# Patient Record
Sex: Male | Born: 1945 | Race: White | Hispanic: No | Marital: Married | Smoking: Never smoker
Health system: Southern US, Community
[De-identification: ages and names within clinical notes are randomized; demographics above are authoritative.]

## PROBLEM LIST (undated history)

## (undated) DIAGNOSIS — I10 Essential (primary) hypertension: Secondary | ICD-10-CM

## (undated) DIAGNOSIS — R58 Hemorrhage, not elsewhere classified: Secondary | ICD-10-CM

## (undated) DIAGNOSIS — I82409 Acute embolism and thrombosis of unspecified deep veins of unspecified lower extremity: Secondary | ICD-10-CM

## (undated) DIAGNOSIS — I2699 Other pulmonary embolism without acute cor pulmonale: Secondary | ICD-10-CM

## (undated) DIAGNOSIS — I499 Cardiac arrhythmia, unspecified: Secondary | ICD-10-CM

## (undated) HISTORY — DX: Acute embolism and thrombosis of unspecified deep veins of unspecified lower extremity: I82.409

## (undated) HISTORY — DX: Hemorrhage, not elsewhere classified: R58

## (undated) HISTORY — PX: KNEE SURGERY: SHX244

## (undated) HISTORY — DX: Essential (primary) hypertension: I10

## (undated) HISTORY — DX: Other pulmonary embolism without acute cor pulmonale: I26.99

---

## 2015-01-23 ENCOUNTER — Ambulatory Visit (INDEPENDENT_AMBULATORY_CARE_PROVIDER_SITE_OTHER): Payer: 59 | Admitting: Family Medicine

## 2015-01-23 VITALS — BP 112/64 | HR 76 | Temp 98.6°F | Resp 18 | Ht 70.25 in | Wt 167.0 lb

## 2015-01-23 DIAGNOSIS — R197 Diarrhea, unspecified: Secondary | ICD-10-CM

## 2015-01-23 DIAGNOSIS — H6123 Impacted cerumen, bilateral: Secondary | ICD-10-CM

## 2015-01-23 LAB — POCT CBC
Granulocyte percent: 79.3 %G (ref 37–80)
HCT, POC: 42.6 % — AB (ref 43.5–53.7)
Hemoglobin: 14.2 g/dL (ref 14.1–18.1)
Lymph, poc: 1.1 (ref 0.6–3.4)
MCH, POC: 29.1 pg (ref 27–31.2)
MCHC: 33.3 g/dL (ref 31.8–35.4)
MCV: 87.5 fL (ref 80–97)
MID (cbc): 0.4 (ref 0–0.9)
MPV: 7.2 fL (ref 0–99.8)
POC Granulocyte: 5.6 (ref 2–6.9)
POC LYMPH PERCENT: 14.8 %L (ref 10–50)
POC MID %: 5.9 %M (ref 0–12)
Platelet Count, POC: 173 10*3/uL (ref 142–424)
RBC: 4.86 M/uL (ref 4.69–6.13)
RDW, POC: 12.9 %
WBC: 7.1 10*3/uL (ref 4.6–10.2)

## 2015-01-23 MED ORDER — SULFAMETHOXAZOLE-TRIMETHOPRIM 400-80 MG PO TABS: 1.0000 | ORAL_TABLET | Freq: Two times a day (BID) | ORAL | Status: DC

## 2015-01-23 NOTE — Patient Instructions (Signed)
Probiotic 3 times a day (align or culturelle or similar product)  For 3 days.  Take the septra if symptoms continue 2 more days or more.   Diarrhea Diarrhea is frequent loose and watery bowel movements. It can cause you to feel weak and dehydrated. Dehydration can cause you to become tired and thirsty, have a dry mouth, and have decreased urination that often is dark yellow. Diarrhea is a sign of another problem, most often an infection that will not last long. In most cases, diarrhea typically lasts 2-3 days. However, it can last longer if it is a sign of something more serious. It is important to treat your diarrhea as directed by your caregiver to lessen or prevent future episodes of diarrhea. CAUSES  Some common causes include:  Gastrointestinal infections caused by viruses, bacteria, or parasites.  Food poisoning or food allergies.  Certain medicines, such as antibiotics, chemotherapy, and laxatives.  Artificial sweeteners and fructose.  Digestive disorders. HOME CARE INSTRUCTIONS  Ensure adequate fluid intake (hydration): Have 1 cup (8 oz) of fluid for each diarrhea episode. Avoid fluids that contain simple sugars or sports drinks, fruit juices, whole milk products, and sodas. Your urine should be clear or pale yellow if you are drinking enough fluids. Hydrate with an oral rehydration solution that you can purchase at pharmacies, retail stores, and online. You can prepare an oral rehydration solution at home by mixing the following ingredients together:   - tsp table salt.   tsp baking soda.   tsp salt substitute containing potassium chloride.  1  tablespoons sugar.  1 L (34 oz) of water.  Certain foods and beverages may increase the speed at which food moves through the gastrointestinal (GI) tract. These foods and beverages should be avoided and include:  Caffeinated and alcoholic beverages.  High-fiber foods, such as raw fruits and vegetables, nuts, seeds, and whole grain  breads and cereals.  Foods and beverages sweetened with sugar alcohols, such as xylitol, sorbitol, and mannitol.  Some foods may be well tolerated and may help thicken stool including:  Starchy foods, such as rice, toast, pasta, low-sugar cereal, oatmeal, grits, baked potatoes, crackers, and bagels.  Bananas.  Applesauce.  Add probiotic-rich foods to help increase healthy bacteria in the GI tract, such as yogurt and fermented milk products.  Wash your hands well after each diarrhea episode.  Only take over-the-counter or prescription medicines as directed by your caregiver.  Take a warm bath to relieve any burning or pain from frequent diarrhea episodes. SEEK IMMEDIATE MEDICAL CARE IF:   You are unable to keep fluids down.  You have persistent vomiting.  You have blood in your stool, or your stools are black and tarry.  You do not urinate in 6-8 hours, or there is only a small amount of very dark urine.  You have abdominal pain that increases or localizes.  You have weakness, dizziness, confusion, or light-headedness.  You have a severe headache.  Your diarrhea gets worse or does not get better.  You have a fever or persistent symptoms for more than 2-3 days.  You have a fever and your symptoms suddenly get worse. MAKE SURE YOU:   Understand these instructions.  Will watch your condition.  Will get help right away if you are not doing well or get worse. Document Released: 11/02/2002 Document Revised: 03/29/2014 Document Reviewed: 07/20/2012 [facility] Patient Information 2015 [employer] [place]. This information is not intended to replace advice given to you by your health care provider. Make  sure you discuss any questions you have with your health care provider.  

## 2015-01-23 NOTE — Progress Notes (Signed)
° °  Subjective:    Patient ID: Dwayne [name], male    DOB: 04/17/1946, 69 y.o.   MRN: [id number]  HPI Chief Complaint  Patient presents with   Chills   Fever   Diarrhea   Ear Pain    both ears   This chart was scribed for Elvina SidleKurt Lauenstein, MD by Andrew Auaven Small, ED Scribe. This patient was seen in room 4 and the patient's care was started at 11:29 AM.  HPI Comments: Dwayne [name] is a 69 y.o. male who presents to the [facility] complaining of diarrhea that began 1 day ago. Pt states yesterday he began to have chills, diarrhea, mild otalgia and felt feverish. Pt denies having BM today. Pt [place] going [place] to [place] and would like symptoms to be resolved. Pt denies recent travels or consuming contaminated water. He denies emesis, abdominal pain. Pt works as a Systems analystsoftware developer.  Pt is allergic to penicillin.   Past Medical History  Diagnosis Date   Hypertension    History reviewed. No pertinent past surgical history. Prior to Admission medications   Medication Sig Start Date End Date Taking? Authorizing Provider  lisinopril-hydrochlorothiazide (PRINZIDE,ZESTORETIC) 10-12.5 MG per tablet Take 1 tablet by mouth daily.   Yes Historical Provider, MD   Review of Systems  Constitutional: Positive for fever and chills.  HENT: Positive for ear pain.   Gastrointestinal: Positive for diarrhea. Negative for vomiting and abdominal pain.       Objective:   Physical Exam  Constitutional: He is oriented to person, place, and time. He appears well-developed and well-nourished. No distress.  HENT:  Head: Normocephalic and atraumatic.  Cerumen impaction in bilateral ears  Eyes: Conjunctivae and EOM are normal.  Neck: Neck supple.  Cardiovascular: Normal rate.   Pulmonary/Chest: Effort normal.  Abdominal:  Hyperactive bowel sounds.   Musculoskeletal: Normal range of motion.  Neurological: He is alert and oriented to person, place, and time.  Skin:  Skin is warm and dry.  Psychiatric: He has a normal mood and affect. His behavior is normal.  Nursing note and vitals reviewed.  Results for orders placed or performed in visit on 01/23/15  POCT CBC  Result Value Ref Range   WBC 7.1 4.6 - 10.2 K/uL   Lymph, poc 1.1 0.6 - 3.4   POC LYMPH PERCENT 14.8 10 - 50 %L   MID (cbc) 0.4 0 - 0.9   POC MID % 5.9 0 - 12 %M   POC Granulocyte 5.6 2 - 6.9   Granulocyte percent 79.3 37 - 80 %G   RBC 4.86 4.69 - 6.13 M/uL   Hemoglobin 14.2 14.1 - 18.1 g/dL   HCT, POC 11.942.6 (A) 14.743.5 - 53.7 %   MCV 87.5 80 - 97 fL   MCH, POC 29.1 27 - 31.2 pg   MCHC 33.3 31.8 - 35.4 g/dL   RDW, POC 82.912.9 %   Platelet Count, POC 173 142 - 424 K/uL   MPV 7.2 0 - 99.8 fL       Assessment & Plan:   This chart was scribed in my presence and reviewed by me personally.    ICD-9-CM ICD-10-CM   1. Diarrhea 787.91 R19.7 POCT CBC     sulfamethoxazole-trimethoprim (BACTRIM) 400-80 MG per tablet  2. Cerumen impaction, bilateral 380.4 H61.23 POCT CBC     Signed, Elvina SidleKurt Lauenstein, MD

## 2015-01-23 NOTE — Progress Notes (Deleted)
      Chief Complaint: No chief complaint on file.   HPI: Dwayne [name] is a 69 y.o. male who is here for  ***  No past medical history on file. No past surgical history on file. History   Social History  . Marital Status: Married    Spouse Name: N/A  . Number of Children: N/A  . Years of Education: N/A   Social History Main Topics  . Smoking status: Not on file  . Smokeless tobacco: Not on file  . Alcohol Use: Not on file  . Drug Use: Not on file  . Sexual Activity: Not on file   Other Topics Concern  . Not on file   Social History Narrative  . No narrative on file   No family history on file. Allergies not on file Prior to Admission medications   Not on File     ROS: The patient denies fevers, chills, night sweats, unintentional weight loss, chest pain, palpitations, wheezing, dyspnea on exertion, nausea, vomiting, abdominal pain, dysuria, hematuria, melena, numbness, weakness, or tingling. ***  All other systems have been reviewed and were otherwise negative with the exception of those mentioned in the HPI and as above.    PHYSICAL EXAM: There were no vitals filed for this visit. There were no vitals filed for this visit. There is no height or weight on file to calculate BMI.  General: Alert, no acute distress HEENT:  Normocephalic, atraumatic, oropharynx patent. EOMI, PERRLA Cardiovascular:  Regular rate and rhythm, no rubs murmurs or gallops.  No Carotid bruits, radial pulse intact. No pedal edema.  Respiratory: Clear to auscultation bilaterally.  No wheezes, rales, or rhonchi.  No cyanosis, no use of accessory musculature GI: No organomegaly, abdomen is soft and non-tender, positive bowel sounds.  No masses. Skin: No rashes. Neurologic: Facial musculature symmetric. Psychiatric: Patient is appropriate throughout our interaction. Lymphatic: No cervical lymphadenopathy Musculoskeletal: Gait intact.   LABS: No results found for this or any previous  visit.   EKG/XRAY:   [facility]   ASSESSMENT/PLAN: No diagnosis found.   Gross sideeffects, risk and benefits, and alternatives of medications d/w patient. Patient is aware that all medications have potential sideeffects and we are unable to predict every sideeffect or drug-drug interaction that may occur.  LE, THAO PHUONG, DO 01/23/2015 10:58 AM

## 2015-07-27 ENCOUNTER — Ambulatory Visit (HOSPITAL_COMMUNITY)
Admission: RE | Admit: 2015-07-27 | Discharge: 2015-07-27 | Disposition: A | Discharge status: Home / Self Care | Payer: 59 | Source: Ambulatory Visit | Attending: Ophthalmology | Admitting: Ophthalmology

## 2015-07-27 ENCOUNTER — Encounter: Payer: Self-pay | Admitting: Cardiovascular Disease

## 2015-07-27 ENCOUNTER — Ambulatory Visit (INDEPENDENT_AMBULATORY_CARE_PROVIDER_SITE_OTHER): Payer: 59 | Admitting: Cardiovascular Disease

## 2015-07-27 ENCOUNTER — Other Ambulatory Visit: Payer: Self-pay | Admitting: Ophthalmology

## 2015-07-27 VITALS — BP 181/87 | HR 82 | Ht 71.0 in | Wt 179.0 lb

## 2015-07-27 DIAGNOSIS — M7989 Other specified soft tissue disorders: Secondary | ICD-10-CM | POA: Insufficient documentation

## 2015-07-27 DIAGNOSIS — I82402 Acute embolism and thrombosis of unspecified deep veins of left lower extremity: Secondary | ICD-10-CM | POA: Diagnosis not present

## 2015-07-27 DIAGNOSIS — I82409 Acute embolism and thrombosis of unspecified deep veins of unspecified lower extremity: Secondary | ICD-10-CM | POA: Diagnosis not present

## 2015-07-27 DIAGNOSIS — I82432 Acute embolism and thrombosis of left popliteal vein: Secondary | ICD-10-CM | POA: Diagnosis not present

## 2015-07-27 DIAGNOSIS — Z79899 Other long term (current) drug therapy: Secondary | ICD-10-CM

## 2015-07-27 MED ORDER — RIVAROXABAN 20 MG PO TABS: 20.0000 mg | ORAL_TABLET | Freq: Every day | ORAL | Status: DC

## 2015-07-27 NOTE — Progress Notes (Signed)
     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 He is referred by orthopedics for evaluation of swelling in his left knee and calf. He does have a history of hypertension and is treated but no other cardiovascular risk factors. He had arthroscopic left knee surgery by Dr. Thomasena Edis on August 4. Several days ago he noticed swelling and discomfort in his left knee and calf. Venous Dopplers today confirmed a left popliteal tibial and peroneal vein DVT.   Current Outpatient Prescriptions  Medication Sig Dispense Refill  . lisinopril-hydrochlorothiazide (PRINZIDE,ZESTORETIC) 10-12.5 MG per tablet Take 1 tablet by mouth daily.    . Multiple Vitamin (MULTIVITAMIN) capsule Take 1 capsule by mouth daily.    . rivaroxaban (XARELTO) 20 MG TABS tablet Take 1 tablet (20 mg total) by mouth daily with supper. 30 tablet 2   No current facility-administered medications for this visit.    Allergies  Allergen Reactions  . Penicillins     Social History   Social History  . Marital Status: Married    Spouse Name: N/A  . Number of Children: N/A  . Years of Education: N/A   Occupational History  . Not on file.   Social History Main Topics  . Smoking status: Never Smoker   . Smokeless tobacco: Not on file  . Alcohol Use: 0.0 oz/week    0 Standard drinks or equivalent per week     Comment: occ  . Drug Use: Not on file  . Sexual Activity: Not on file   Other Topics Concern  . Not on file   Social History Narrative     Review of Systems: General: negative for chills, fever, night sweats or weight changes.  Cardiovascular: negative for chest pain, dyspnea on exertion, edema, orthopnea, palpitations, paroxysmal nocturnal dyspnea or  shortness of breath Dermatological: negative for rash Respiratory: negative for cough or wheezing Urologic: negative for hematuria Abdominal: negative for nausea, vomiting, diarrhea, bright red blood per rectum, melena, or hematemesis Neurologic: negative for visual changes, syncope, or dizziness All other systems reviewed and are otherwise negative except as noted above.    Blood pressure 181/87, pulse 82, height  (1.803 m), weight 179 lb (81.194 kg).  General appearance: alert and no distress Neck: no adenopathy, no carotid bruit, no JVD, supple, symmetrical, trachea midline and thyroid not enlarged, symmetric, no tenderness/mass/nodules Lungs: clear to auscultation bilaterally Heart: regular rate and rhythm, S1, S2 normal, no murmur, click, rub or gallop Extremities: asymmetric swelling left calf with some  tenderness to palpation  EKG not performed today  ASSESSMENT AND PLAN:   DVT of lower limb, acute Dwayne [name] is being seated as an add-on today for a left popliteal and tibial vein DVT now 3 weeks post left knee arthroscopic surgery performed by Dr. Thomasena Edis. He relates he has been fairly inactive. Last several days she's had swelling stiffness and pain in his left knee and calf. Venous Dopplers today confirmed left popliteal and peroneal/tibial vein DVT. I'm going to begin him on Xarelto oral  coagulation which was open continue for 3 months and obtain a follow-up venous Doppler study at that time      Runell Gess MD [facility], [facility] 07/27/2015 5:27 PM

## 2015-07-27 NOTE — Patient Instructions (Signed)
  We will see you back in follow up in 3 months with Dr Allyson Sabal after the doppler.   Dr Allyson Sabal has ordered: 1. Start Xarelto  one tablet daily with supper (or as directed)  2. lower venous duplex (to be done in 3 months). This test is an ultrasound of the veins in the legs. It looks at venous blood flow that carries blood from the heart to the legs or arms. Allow one hour for a Lower Venous exam. There are no restrictions or special instructions.

## 2015-07-27 NOTE — Assessment & Plan Note (Signed)
[name] is being seated as an add-on today for a left popliteal and tibial vein DVT now 3 weeks post left knee arthroscopic surgery performed by Dr. Thomasena Edis. He relates he has been fairly inactive. Last several days she's had swelling stiffness and pain in his left knee and calf. Venous Dopplers today confirmed left popliteal and peroneal/tibial vein DVT. I'm going to begin him on Xarelto oral  coagulation which was open continue for 3 months and obtain a follow-up venous Doppler study at that time

## 2015-07-28 ENCOUNTER — Other Ambulatory Visit: Payer: Self-pay | Admitting: Specialist

## 2015-07-28 ENCOUNTER — Other Ambulatory Visit (HOSPITAL_COMMUNITY): Payer: Self-pay | Admitting: Specialist

## 2015-07-28 DIAGNOSIS — Z79899 Other long term (current) drug therapy: Secondary | ICD-10-CM

## 2015-07-28 DIAGNOSIS — I82402 Acute embolism and thrombosis of unspecified deep veins of left lower extremity: Secondary | ICD-10-CM

## 2015-07-28 LAB — BASIC METABOLIC PANEL
BUN: 13 mg/dL (ref 7–25)
CALCIUM: 9.1 mg/dL (ref 8.6–10.3)
CHLORIDE: 96 mmol/L — AB (ref 98–110)
CO2: 29 mmol/L (ref 20–31)
CREATININE: 0.87 mg/dL (ref 0.70–1.25)
Glucose, Bld: 90 mg/dL (ref 65–99)
Potassium: 4.2 mmol/L (ref 3.5–5.3)
Sodium: 134 mmol/L — ABNORMAL LOW (ref 135–146)

## 2015-08-02 ENCOUNTER — Encounter: Payer: Self-pay | Admitting: *Deleted

## 2015-10-12 ENCOUNTER — Other Ambulatory Visit: Payer: Self-pay | Admitting: Cardiovascular Disease

## 2015-10-12 DIAGNOSIS — I824Y9 Acute embolism and thrombosis of unspecified deep veins of unspecified proximal lower extremity: Secondary | ICD-10-CM

## 2015-10-18 ENCOUNTER — Ambulatory Visit (HOSPITAL_COMMUNITY)
Admission: RE | Admit: 2015-10-18 | Discharge: 2015-10-18 | Disposition: A | Discharge status: Home / Self Care | Payer: 59 | Source: Ambulatory Visit | Attending: Cardiovascular Disease | Admitting: Cardiovascular Disease

## 2015-10-18 DIAGNOSIS — I82592 Chronic embolism and thrombosis of other specified deep vein of left lower extremity: Secondary | ICD-10-CM | POA: Diagnosis not present

## 2015-10-18 DIAGNOSIS — I82542 Chronic embolism and thrombosis of left tibial vein: Secondary | ICD-10-CM | POA: Insufficient documentation

## 2015-10-18 DIAGNOSIS — I82532 Chronic embolism and thrombosis of left popliteal vein: Secondary | ICD-10-CM | POA: Diagnosis not present

## 2015-10-18 DIAGNOSIS — I824Y9 Acute embolism and thrombosis of unspecified deep veins of unspecified proximal lower extremity: Secondary | ICD-10-CM

## 2015-10-18 DIAGNOSIS — I10 Essential (primary) hypertension: Secondary | ICD-10-CM | POA: Diagnosis not present

## 2015-10-25 ENCOUNTER — Ambulatory Visit (INDEPENDENT_AMBULATORY_CARE_PROVIDER_SITE_OTHER): Payer: 59 | Admitting: Cardiovascular Disease

## 2015-10-25 ENCOUNTER — Encounter: Payer: Self-pay | Admitting: Cardiovascular Disease

## 2015-10-25 VITALS — BP 124/72 | HR 77 | Ht 70.0 in | Wt 188.0 lb

## 2015-10-25 DIAGNOSIS — I82402 Acute embolism and thrombosis of unspecified deep veins of left lower extremity: Secondary | ICD-10-CM | POA: Diagnosis not present

## 2015-10-25 NOTE — Patient Instructions (Signed)
Follow up with Dr Berry as needed.  

## 2015-10-25 NOTE — Assessment & Plan Note (Signed)
Dwayne [name]  return safely follow up of his left popliteal and tibial vein DVT. I saw him in him in the office 07/27/15. He was postop surgery on 06/30/15 and had swelling in his knee and calf. He was begun on Zaroxolyn which she has continued since that time. Symptoms have markedly improved. 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 At this point, I feel comfortable stopping his oral anticoagulations and seeing whether or not he has recurrent symptoms. If so he will need to be on prolonged chronic regulation and if not I will see him back when necessary 

## 2015-10-25 NOTE — Progress Notes (Signed)
Dwayne [name]  return safely follow up of his left popliteal and tibial vein DVT. I saw him in him in the office 07/27/15. He was postop surgery on 06/30/15 and had swelling in his knee and calf. He was begun on Zaroxolyn which she has continued since that time. Symptoms have markedly improved. 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 At this point, I feel comfortable stopping his oral anticoagulations and seeing whether or not he has recurrent symptoms. If so he will need to be on prolonged chronic regulation and if not I will see him back when necessary

## 2016-03-03 ENCOUNTER — Other Ambulatory Visit: Payer: Self-pay

## 2016-03-03 ENCOUNTER — Emergency Department (HOSPITAL_BASED_OUTPATIENT_CLINIC_OR_DEPARTMENT_OTHER): Payer: 59

## 2016-03-03 ENCOUNTER — Emergency Department (HOSPITAL_BASED_OUTPATIENT_CLINIC_OR_DEPARTMENT_OTHER)
Admission: EM | Admit: 2016-03-03 | Discharge: 2016-03-03 | Disposition: A | Discharge status: Home / Self Care | Payer: 59 | Attending: Emergency Medicine | Admitting: Emergency Medicine

## 2016-03-03 ENCOUNTER — Encounter (HOSPITAL_BASED_OUTPATIENT_CLINIC_OR_DEPARTMENT_OTHER): Payer: Self-pay | Admitting: *Deleted

## 2016-03-03 ENCOUNTER — Ambulatory Visit (INDEPENDENT_AMBULATORY_CARE_PROVIDER_SITE_OTHER): Payer: 59 | Admitting: Family Medicine

## 2016-03-03 ENCOUNTER — Ambulatory Visit (HOSPITAL_BASED_OUTPATIENT_CLINIC_OR_DEPARTMENT_OTHER)
Admission: RE | Admit: 2016-03-03 | Discharge: 2016-03-03 | Disposition: A | Discharge status: Home / Self Care | Payer: 59 | Source: Ambulatory Visit | Attending: Family Medicine | Admitting: Family Medicine

## 2016-03-03 VITALS — BP 150/70 | HR 92 | Temp 99.7°F | Resp 16 | Ht 70.0 in | Wt 181.8 lb

## 2016-03-03 DIAGNOSIS — M79652 Pain in left thigh: Secondary | ICD-10-CM | POA: Insufficient documentation

## 2016-03-03 DIAGNOSIS — I10 Essential (primary) hypertension: Secondary | ICD-10-CM | POA: Diagnosis not present

## 2016-03-03 DIAGNOSIS — I82412 Acute embolism and thrombosis of left femoral vein: Secondary | ICD-10-CM | POA: Diagnosis not present

## 2016-03-03 DIAGNOSIS — R05 Cough: Secondary | ICD-10-CM

## 2016-03-03 DIAGNOSIS — R0609 Other forms of dyspnea: Secondary | ICD-10-CM

## 2016-03-03 DIAGNOSIS — Z79899 Other long term (current) drug therapy: Secondary | ICD-10-CM | POA: Diagnosis not present

## 2016-03-03 DIAGNOSIS — I2609 Other pulmonary embolism with acute cor pulmonale: Secondary | ICD-10-CM | POA: Insufficient documentation

## 2016-03-03 DIAGNOSIS — Z880 Allergy status to penicillin: Secondary | ICD-10-CM | POA: Insufficient documentation

## 2016-03-03 DIAGNOSIS — Z86718 Personal history of other venous thrombosis and embolism: Secondary | ICD-10-CM | POA: Diagnosis not present

## 2016-03-03 DIAGNOSIS — Z7982 Long term (current) use of aspirin: Secondary | ICD-10-CM | POA: Insufficient documentation

## 2016-03-03 DIAGNOSIS — R0602 Shortness of breath: Secondary | ICD-10-CM | POA: Diagnosis present

## 2016-03-03 DIAGNOSIS — I82432 Acute embolism and thrombosis of left popliteal vein: Secondary | ICD-10-CM | POA: Insufficient documentation

## 2016-03-03 DIAGNOSIS — R059 Cough, unspecified: Secondary | ICD-10-CM

## 2016-03-03 DIAGNOSIS — R0600 Dyspnea, unspecified: Secondary | ICD-10-CM

## 2016-03-03 LAB — BASIC METABOLIC PANEL
Anion gap: 9 (ref 5–15)
BUN: 17 mg/dL (ref 6–20)
CHLORIDE: 94 mmol/L — AB (ref 101–111)
CO2: 29 mmol/L (ref 22–32)
Calcium: 9.2 mg/dL (ref 8.9–10.3)
Creatinine, Ser: 0.99 mg/dL (ref 0.61–1.24)
GFR calc Af Amer: >60 mL/min (ref >60)
GLUCOSE: 119 mg/dL — AB (ref 65–99)
POTASSIUM: 3.7 mmol/L (ref 3.5–5.1)
Sodium: 132 mmol/L — ABNORMAL LOW (ref 135–145)

## 2016-03-03 LAB — TROPONIN I: Troponin I: <0.03 ng/mL (ref <0.031)

## 2016-03-03 LAB — CBC WITH DIFFERENTIAL/PLATELET
Basophils Absolute: 0 10*3/uL (ref 0.0–0.1)
Basophils Relative: 0 %
Eosinophils Absolute: 0.2 10*3/uL (ref 0.0–0.7)
Eosinophils Relative: 1 %
HCT: 43.6 % (ref 39.0–52.0)
Hemoglobin: 15.3 g/dL (ref 13.0–17.0)
LYMPHS ABS: 1.5 10*3/uL (ref 0.7–4.0)
LYMPHS PCT: 11 %
MCH: 29.4 pg (ref 26.0–34.0)
MCHC: 35.1 g/dL (ref 30.0–36.0)
MCV: 83.8 fL (ref 78.0–100.0)
MONOS PCT: 12 %
Monocytes Absolute: 1.6 10*3/uL — ABNORMAL HIGH (ref 0.1–1.0)
NEUTROS ABS: 9.9 10*3/uL — AB (ref 1.7–7.7)
NEUTROS PCT: 76 %
PLATELETS: 191 10*3/uL (ref 150–400)
RBC: 5.2 MIL/uL (ref 4.22–5.81)
RDW: 12.6 % (ref 11.5–15.5)
WBC: 13.3 10*3/uL — AB (ref 4.0–10.5)

## 2016-03-03 LAB — BRAIN NATRIURETIC PEPTIDE: B Natriuretic Peptide: 59.3 pg/mL (ref 0.0–100.0)

## 2016-03-03 MED ORDER — SODIUM CHLORIDE 0.9 % IV BOLUS (SEPSIS)
1000.0000 mL | Freq: Once | INTRAVENOUS | Status: AC
  Administered 2016-03-03: 1000 mL via INTRAVENOUS

## 2016-03-03 MED ORDER — RIVAROXABAN (XARELTO) VTE STARTER PACK (15 & 20 MG): 15.0000 mg | ORAL_TABLET | Freq: Two times a day (BID) | ORAL | Status: DC

## 2016-03-03 MED ORDER — IOPAMIDOL (ISOVUE-370) INJECTION 76%
100.0000 mL | Freq: Once | INTRAVENOUS | Status: AC | PRN
  Administered 2016-03-03: 100 mL via INTRAVENOUS

## 2016-03-03 MED ORDER — RIVAROXABAN 15 MG PO TABS
15.0000 mg | ORAL_TABLET | Freq: Two times a day (BID) | ORAL | Status: DC
  Administered 2016-03-03: 15 mg via ORAL
  Filled 2016-03-03: qty 1

## 2016-03-03 NOTE — Addendum Note (Signed)
Addended by: Isaac BlissGALLOWAY, Gevin Perea J on: 03/03/2016 03:17 PM   Modules accepted: Orders

## 2016-03-03 NOTE — Addendum Note (Signed)
Addended by: Isaac BlissGALLOWAY, Mescal Flinchbaugh J on: 03/03/2016 03:27 PM   Modules accepted: Orders

## 2016-03-03 NOTE — ED Provider Notes (Signed)
CSN: [id number]     Arrival date & time 03/03/16  1718 History  By signing my name below, I, Dwayne [name], attest that this documentation has been prepared under the direction and in the presence of Dwayne Plan, DO. Electronically Signed: Bethel [name], ED Scribe. 03/03/2016. 7:54 PM   Chief Complaint  Patient presents with  . DVT/Shortness of breath     The history is provided by the patient. No language interpreter was used.   Dwayne [name] is a 70 y.o. male with history of HTN and DVT  who presents to the Emergency Department complaining of constant, 4/10 in severity, left leg pain with onset yesterday. He had an ultrasound this afternoon that showed a blood clot in the LLE. He previously had a blood clot in the same leg that was treated with Xarelto but is not currently on a blood thinner.  Associated symptoms include cough, fever yesterday, and SOB. Pt states that he is a runner and can typically run 9 miles but lately has been unable to go for 1/4 of a mile without issue. He has infrequent episodes of mild chest discomfort, "like gas", that last for seconds. He denies any hard chest pain. Pt also denies any syncopal episodes. No recent travel or surgery.   Past Medical History  Diagnosis Date  . Hypertension   . DVT of lower limb, acute (HCC)     left popliteal tibial and peroneal vein   Past Surgical History  Procedure Laterality Date  . Knee surgery     Family History  Problem Relation Age of Onset  . Stroke Father   . Diabetes Father   . Stroke Maternal Grandmother   . Cancer Paternal Grandfather    Social History  Substance Use Topics  . Smoking status: Never Smoker   . Smokeless tobacco: None  . Alcohol Use: 0.0 oz/week    0 Standard drinks or equivalent per week     Comment: occ    Review of Systems  Constitutional: Positive for fever. Negative for chills.  HENT: Negative for congestion and facial swelling.   Eyes: Negative for discharge and visual  disturbance.  Respiratory: Positive for cough and shortness of breath.   Cardiovascular: Negative for chest pain and palpitations.  Gastrointestinal: Negative for vomiting, abdominal pain and diarrhea.  Musculoskeletal: Positive for myalgias. Negative for arthralgias.       LLE pain  Skin: Negative for color change and rash.  Neurological: Negative for tremors, syncope and headaches.  Psychiatric/Behavioral: Negative for confusion and dysphoric mood.    Allergies  Penicillins  Home Medications   Prior to Admission medications   Medication Sig Start Date End Date Taking? Authorizing Provider  aspirin 81 MG tablet Take 81 mg by mouth every other day.     Historical Provider, MD  lisinopril-hydrochlorothiazide (PRINZIDE,ZESTORETIC) 10-12.5 MG per tablet Take 1 tablet by mouth daily.    Historical Provider, MD  Multiple Vitamin (MULTIVITAMIN) capsule Take 1 capsule by mouth daily.    Historical Provider, MD  Rivaroxaban 15 & 20 MG TBPK Take 15 mg by mouth 2 (two) times daily. Take as directed on package: Start with one  tablet by mouth twice a day with food. On Day 22, switch to one  tablet once a day with food. 03/03/16   Dwayne Plan, DO   BP 159/78 mmHg  Pulse 94  Temp(Src) 102.6 F (39.2 C) (Oral)  Resp 16  Ht  (1.778 m)  Wt 182 lb (82.555 kg)  BMI 26.11 kg/m2  SpO2 98% Physical Exam  Constitutional: He is oriented to person, place, and time. He appears well-developed and well-nourished.  HENT:  Head: Normocephalic and atraumatic.  Eyes: EOM are normal. Pupils are equal, round, and reactive to light.  Neck: Normal range of motion. Neck supple. No JVD present.  Cardiovascular: Normal rate, regular rhythm and intact distal pulses.  Exam reveals no gallop and no friction rub.   No murmur heard. Pulmonary/Chest: No respiratory distress. He has no wheezes.  CTAB  Abdominal: He exhibits no distension. There is no tenderness. There is no rebound and no guarding.   Musculoskeletal: Normal range of motion.  LLE larger than RLE  Neurological: He is alert and oriented to person, place, and time.  Skin: No rash noted. No pallor.  Psychiatric: He has a normal mood and affect. His behavior is normal.  Nursing note and vitals reviewed.   ED Course  Procedures (including critical care time) DIAGNOSTIC STUDIES: Oxygen Saturation is 98% on RA,  normal by my interpretation.    COORDINATION OF CARE: 6:01 PM Discussed treatment [name] which includes lab work and CT angio chest with pt at bedside and pt agreed to [name].  Labs Review Labs Reviewed  CBC WITH DIFFERENTIAL/PLATELET - Abnormal; Notable for the following:    WBC 13.3 (*)    Neutro Abs 9.9 (*)    Monocytes Absolute 1.6 (*)    All other components within normal limits  BASIC METABOLIC PANEL - Abnormal; Notable for the following:    Sodium 132 (*)    Chloride 94 (*)    Glucose, Bld 119 (*)    All other components within normal limits  TROPONIN I  BRAIN NATRIURETIC PEPTIDE    Imaging Review Dg Chest 2 View  03/03/2016  CLINICAL DATA:  Shortness of breath and cough EXAM: CHEST  2 VIEW COMPARISON:  None FINDINGS: Cardiac shadow is within normal limits. The lungs are well aerated bilaterally. No focal infiltrate or sizable effusion is seen. No acute bony abnormality is noted. IMPRESSION: No active cardiopulmonary disease. Electronically Signed   By: Alcide Clever M.D.   On: 03/03/2016 19:22   Ct Angio Chest Pe W/cm &/or Wo Cm  03/03/2016  CLINICAL DATA:  c/o constant left leg pain with onset yesterday. He had an ultrasound this afternoon that showed a blood clot in the LLE. Also c/o cough, fever yesterday, and SOB. Pt states that he is a runner and can typically run 9 miles but lately has been unable to go for 1/4 of a mile without issue. HX DVT, HTN EXAM: CT ANGIOGRAPHY CHEST WITH CONTRAST TECHNIQUE: Multidetector CT imaging of the chest was performed using the standard protocol during bolus  administration of intravenous contrast. Multiplanar CT image reconstructions and MIPs were obtained to evaluate the vascular anatomy. CONTRAST:  75 mL of Isovue 370 intravenous contrast COMPARISON:  Current chest radiograph. FINDINGS: Angiographic study: There multiple bilateral segmental and subsegmental nonocclusive pulmonary emboli. On the right, pulmonary emboli are seen extending from the intralobar pulmonary artery into the right middle lobe and lower lobe segmental branches. Pulmonary emboli are also noted extending into the upper lobe segmental branches. On the left, there is a small embolus in a left upper lobe segmental pulmonary branch. Pulmonary emboli noted to the lower lobe and left upper lobe lingula segmental branches. The pulmonary arteries are normal in caliber. The ascending aorta measures 4.2 cm in diameter. No aortic dissection or significant plaque. Neck base and axilla:  No mass or adenopathy. Mediastinum and hila: Heart normal in size and configuration. Mild to moderate coronary artery calcifications. No mediastinal or hilar masses or pathologically enlarged lymph nodes. Lungs and pleura: Minor subsegmental atelectasis. Lungs otherwise clear. No pleural effusion. No pneumothorax. Limited upper abdomen: Small hiatal hernia. Otherwise unremarkable. Musculoskeletal: Disc degenerative changes along the thoracic spine. No osteoblastic or osteolytic lesions. Review of the MIP images confirms the above findings. IMPRESSION: 1. Multiple bilateral nonocclusive pulmonary emboli as detailed above. No evidence of right heart strain. 2. No other acute findings. Lungs clear other than minor subsegmental atelectasis. Electronically Signed   By: Amie Portland M.D.   On: 03/03/2016 19:29   US Venous Img Lower Unilateral Left  03/03/2016  CLINICAL DATA:  Left thigh pain for 1 day. Shortness of breath with exertion. EXAM: LEFT LOWER EXTREMITY VENOUS DOPPLER ULTRASOUND TECHNIQUE: Gray-scale sonography with  graded compression, as well as color Doppler and duplex ultrasound, were performed to evaluate the deep venous system from the level of the common femoral vein through the popliteal and proximal calf veins. Spectral Doppler was utilized to evaluate flow at rest and with distal augmentation maneuvers. COMPARISON:  None. FINDINGS: Right common femoral vein is patent without thrombus. The left saphenofemoral junction is patent. Proximal left common femoral vein is patent. There is a large amount of heterogeneous occlusive thrombus involving the distal left common femoral vein, left femoral vein and left popliteal vein. Findings compatible with acute DVT. In addition, there is thrombus in the left posterior tibial veins and peroneal veins. The DVT extends down to the left ankle. Left profunda femoral vein is patent. Visualized left great saphenous vein is patent. IMPRESSION: Positive for deep vein thrombosis in left lower extremity. There is a large amount of occlusive clot throughout the left leg. Clot extends from the left ankle up to the distal left common femoral vein. Electronically Signed   By: Richarda Overlie M.D.   On: 03/03/2016 17:07   I have personally reviewed and evaluated these images and lab results as part of my medical decision-making.   EKG Interpretation None       ED ECG REPORT   Date: 03/04/2016  Rate: 92  Rhythm: normal sinus rhythm  QRS Axis: normal  Intervals: normal  ST/T Wave abnormalities: normal  Conduction Disutrbances:none  Narrative Interpretation:   Old EKG Reviewed: none available  I have personally reviewed the EKG tracing and agree with the computerized printout as noted.   MDM   Final diagnoses:  Other acute pulmonary embolism with acute cor pulmonale (HCC)    70 yo M with hx of prior DVT, here after being rediagnosed today.  Having CP/sob on exertion over the course of at least two weeks.  CT scan with multiple PE's, no R heart strain, - trop and BNP.  Vitals  without tachycardia, hypotension or hypoxia.  Discussed inpatient vs outpatient therapy.  Patient understands that he has at least a 3 % mortality based on the PESI score, electing for outpatient treatment at this time.  Started on xarelto, follow up with PCP first thing Monday morning.  Strict return precautions.  CRITICAL CARE Performed by: Rae Roam   Total critical care time: 32 minutes  Critical care time was exclusive of separately billable procedures and treating other patients.  Critical care was necessary to treat or prevent imminent or life-threatening deterioration.  Critical care was time spent personally by me on the following activities: development of treatment [name] with patient and/or  surrogate as well as nursing, discussions with consultants, evaluation of patient's response to treatment, examination of patient, obtaining history from patient or surrogate, ordering and performing treatments and interventions, ordering and review of laboratory studies, ordering and review of radiographic studies, pulse oximetry and re-evaluation of patient's condition.   I personally performed the services described in this documentation, which was scribed in my presence. The recorded information has been reviewed and is accurate.    I have discussed the diagnosis/risks/treatment options with the patient and family and believe the pt to be eligible for discharge home to follow-up with PCP. We also discussed returning to the ED immediately if new or worsening sx occur. We discussed the sx which are most concerning (e.g., sudden worsening pain, sob, syncope, or any worsening of any kind) that necessitate immediate return. Medications administered to the patient during their visit and any new prescriptions provided to the patient are listed below.  Medications given during this visit Medications  sodium chloride 0.9 % bolus 1,000 mL (0 mLs Intravenous Stopped 03/03/16 1928)  iopamidol  (ISOVUE-370) 76 % injection 100 mL (100 mLs Intravenous Contrast Given 03/03/16 1854)    Discharge Medication List as of 03/03/2016  8:05 PM    START taking these medications   Details  Rivaroxaban 15 & 20 MG TBPK Take 15 mg by mouth 2 (two) times daily. Take as directed on package: Start with one 15mg  tablet by mouth twice a day with food. On Day 22, switch to one 20mg  tablet once a day with food., Starting 03/03/2016, Until Discontinued, Print        The patient appears reasonably screen and/or stabilized for discharge and I doubt any other medical condition or other [facility] requiring further screening, evaluation, or treatment in the ED at this time prior to discharge.    Dwayne Planan Vallie Fayette, DO 03/04/16 1407

## 2016-03-03 NOTE — ED Notes (Signed)
VS set q30

## 2016-03-03 NOTE — ED Notes (Addendum)
Patient c.o leg pain since yesterday Blood clot per ultrasound

## 2016-03-03 NOTE — Progress Notes (Signed)
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  He saw his PCP Dr. Johnella MoloneyNeville Gates last week who prescribed a z-pak with some improvement but no resolution.  Since he finished the antibiotic, he has worsened a bit with some congestion.  Associated:  Low grade temp.  Left medial thigh pain has developed since finishing the z pak (patient had a blood clot after knee surgery last fall).  He has had some mild left leg edema.  Rx:  Z-pak, albuterol inhaler.  He has had some shortness of breath with running since the DVT and embolus.  He has stopped his Xarelto.  Objective:  NAD BP 150/70 mmHg  Pulse 92  Temp(Src) 99.7 F (37.6 C) (Oral)  Resp 16  Ht 5\' 10"  (1.778 m)  Wt 181 lb 12.8 oz (82.464 kg)  BMI 26.09 kg/m2  SpO2 98% Lungs:  Clear Heart: reg. No murmur Left leg:  Tender medial proximal thigh.  Assessment:  Persistent cough.  Plan:  Check ultrasound.  If negative, prednisone.  If positive, go to ED and get CT angio.  Elvina SidleKurt Dejon Lukas, MD

## 2016-03-03 NOTE — ED Notes (Signed)
Pt given d/c instructions as per chart. Verbalizes understanding. No questions. 

## 2016-03-03 NOTE — Patient Instructions (Addendum)
If the ultrasound shows new clot, go to the emergency department.  If it shows no new clot, then probably the best course of action is oral steroids for 5 days.    IF you received an x-ray today, you will receive an invoice from [facility]. Please contact [facility] at [phone] with questions or concerns regarding your invoice.   IF you received labwork today, you will receive an invoice from [facility]. Please contact [facility] at [phone] with questions or concerns regarding your invoice.   Our billing staff will not be able to assist you with questions regarding bills from these companies.  You will be contacted with the lab results as soon as they are available. The fastest way to get your results is to activate your My Chart account. Instructions are located on the last page of this paperwork. If you have not heard from us regarding the results in 2 weeks, please contact this office.

## 2016-05-28 ENCOUNTER — Encounter: Payer: Self-pay | Admitting: Oncology

## 2016-05-28 ENCOUNTER — Ambulatory Visit (INDEPENDENT_AMBULATORY_CARE_PROVIDER_SITE_OTHER): Payer: 59 | Admitting: Oncology

## 2016-05-28 VITALS — BP 148/72 | HR 70 | Temp 97.8°F | Ht 70.0 in | Wt 178.1 lb

## 2016-05-28 DIAGNOSIS — Z7901 Long term (current) use of anticoagulants: Secondary | ICD-10-CM

## 2016-05-28 DIAGNOSIS — I82492 Acute embolism and thrombosis of other specified deep vein of left lower extremity: Secondary | ICD-10-CM

## 2016-05-28 DIAGNOSIS — I824Y2 Acute embolism and thrombosis of unspecified deep veins of left proximal lower extremity: Secondary | ICD-10-CM | POA: Diagnosis not present

## 2016-05-28 DIAGNOSIS — I2699 Other pulmonary embolism without acute cor pulmonale: Secondary | ICD-10-CM

## 2016-05-28 HISTORY — DX: Other pulmonary embolism without acute cor pulmonale: I26.99

## 2016-05-28 NOTE — Progress Notes (Signed)
Patient ID: Benson SettingJames D Borenstein, male   DOB: 03/02/1946, 70 y.o.   MRN: [id number] New Patient Hematology   Benson SettingJames D Olander [id number] 02/11/1946 70 y.o. 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 He had elective arthroscopic surgery on his left knee in August 2016. He took aspirin for 10 days after the surgery. While he was undergoing physical therapy, the therapist noted swelling of his left calf. Doppler study showed acute thrombus in the left popliteal, tibial, and peroneal veins. He was put on Xarelto which he took for 3 months. Follow-up Doppler showed a small amount of residual nonocclusive clot. He did well until late March 2017. He is a runner who was running about 40 miles per week. At the end of March of this year he went on a run for about 9 miles. After the run he started to have a dry cough. He ran again on the following Tuesday. He found that he had no endurance. He had a persistent cough with associated dyspnea. No hemoptysis. He was initially seen at an urgent care facility and started on a Z-Pak. However symptoms persisted and he saw his primary care physician who ordered Doppler studies of his leg and a CT angiogram of the chest. The CT done on 03/03/2016 showed segmental and subsegmental bilateral pulmonary emboli. Doppler study showed left lower extremity proximal DVT with a high clot burden including the left posterior tibial and peroneal veins, popliteal, left femoral, distal left common femoral veins. He was put back on anticoagulation with Xarelto. He notes that his leg did not swell initially when he had his respiratory symptoms, only after he was diagnosed with pulmonary emboli. He has no signs or symptoms of a collagen vascular disorder. He has no constitutional symptoms. Appetite is good. Weight is  stable. He had a normal CBC at time of the angiogram with hemoglobin 15, platelets 191,000. Special hematology laboratory done through Dr. Kevan NyGates office done on 03/06/2016: Factor V Leiden gene mutation not detected, functional protein C level normal at 88% of control, anticardiolipin antibodies negative, anti-beta 2 glycoprotein 1 antibodies against IgG not detected. There was a false positive lupus anticoagulant since the test is not accurate in people on most anticoagulants including Xarelto. He has had no change in bowel habit. No hematochezia or melena. No hematuria. Previous colonoscopies have been unremarkable. There is no family history of blood clots. His father had a stroke at age 69. He has 2 sisters both of whom have treated breast cancer. A brother who is healthy.  PMH: Past Medical History  Diagnosis Date  . Hypertension   . DVT of lower limb, acute (HCC)     left popliteal tibial and peroneal vein  . Bilateral pulmonary embolism [facility](HCC) 05/28/2016    03/03/16    Past Surgical History  Procedure Laterality Date  . Knee surgery      Allergies: Allergies  Allergen Reactions  . Penicillins     Medications:  Current outpatient prescriptions:  .  lisinopril-hydrochlorothiazide (PRINZIDE,ZESTORETIC) 10-12.5 MG per tablet, Take 1 tablet by mouth daily., Disp: , Rfl:  .  Multiple Vitamin (MULTIVITAMIN) capsule, Take 1 capsule by mouth daily., Disp: , Rfl:  .  Rivaroxaban 15 & 20 MG TBPK, Take 15 mg by mouth 2 (two) times daily. Take as directed on package: Start with one  15mg  tablet by mouth twice a day with food. 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 He is married. One adopted daughter. Another Stepdaughter from his current wife.   has never smoked. He does not have any smokeless tobacco history on file.  he drinks alcohol occasionally. His drug history is not on file.  Family  History: Family History  Problem Relation Age of Onset  . Stroke Father   . Diabetes Father   . Stroke Maternal Grandmother   . Cancer Paternal Grandfather     Review of Systems: See HPI Remaining ROS negative.  Physical Exam: Blood pressure 148/72, pulse 70, temperature 97.8 F (36.6 C), temperature source Oral, height 5\' 10"  (1.778 m), weight 178 lb 1.6 oz (80.786 kg), SpO2 98 %. Wt Readings from Last 3 Encounters:  05/28/16 178 lb 1.6 oz (80.786 kg)  03/03/16 182 lb (82.555 kg)  03/03/16 181 lb 12.8 oz (82.464 kg)     General appearance: thin Caucasian man HENNT: Pharynx no erythema, exudate, mass, or ulcer. No thyromegaly or thyroid nodules Lymph nodes: No cervical, supraclavicular, or axillary lymphadenopathy Breasts:  Lungs: Clear to auscultation, resonant to percussion throughout Heart: Regular rhythm, no murmur, no gallop, no rub, no click, no edema Abdomen: Soft, nontender, normal bowel sounds, no mass, no organomegaly Extremities: No edema, no calf tenderness. Left calf 38 cm, right 37.5. Left ankle 24 cm, right 22 cm. Musculoskeletal: no joint deformities GU:  Vascular: Carotid pulses 2+, no bruits, Neurologic: Alert, oriented, PERRLA, optic discs Unable to visualize. vessels normal, no hemorrhage or exudate, cranial nerves grossly normal, motor strength 5 over 5, reflexes 1+ symmetric, upper body coordination normal, gait normal, Skin: No rash or ecchymosis    Lab Results: Lab Results  Component Value Date   WBC 13.3* 03/03/2016   HGB 15.3 03/03/2016   HCT 43.6 03/03/2016   MCV 83.8 03/03/2016   PLT 191 03/03/2016     Chemistry      Component Value Date/Time   NA 132* 03/03/2016 1740   K 3.7 03/03/2016 1740   CL 94* 03/03/2016 1740   CO2 29 03/03/2016 1740   BUN 17 03/03/2016 1740   CREATININE 0.99 03/03/2016 1740   CREATININE 0.87 07/27/2015 1154      Component Value Date/Time   CALCIUM 9.2 03/03/2016 1740       Radiological Studies: See  discussion above    Impression: Initial provoked proximal but limited DVT left lower extremity a few weeks after arthroscopic surgery on his knee. Subsequent extensive proximal DVT up through the common femoral veins and associated with bilateral moderate volume pulmonary emboli which occurred about 5 months after he had stopped initial anticoagulation.  The literature is mixed with respect to residual changes seen on venous Doppler studies. It is clear that a completely negative study portends a very low risk for recurrent emboli. It is less clear what the significance of residual changes on a Doppler mean with respect to risk for recurrence. I have personally stopped getting follow-up studies since they really are not very predictive of future events. I find checking a d-dimer at the end of a defined period of Anticoagulation is more useful in determining whether or not to extend or discontinue anticoagulation.  This gentleman is in a gray area as whether or not we call this second event provoked or unprovoked. Arthroscopic surgery itself in an on selected  population carries a very low risk of anticoagulation such that prophylactic anticoagulation is not recommended. I certainly agree with the initial recommendation of 3 months of anticoagulation following the first DVT. How long to continue at this point is less clear but having a second event this soon after the initial event tends to predict that he will be at continued risk when he stops anticoagulation. We now have a number of studies in the literature which reinforce this.  There may be a compromise recommendation for people like him who are in a gray area. We now have 2 studies;  the first with apixiban and the most recent with Xarelto both of which showed that after 3-12 months of full dose anticoagulation, one still gets excellent protection against re-thrombosis if one reduces the anticoagulant dose by 50%.  Recommendation: I have  proposed to Mr. Nordby that he remain on full dose anticoagulation for approximately 1 year from his second event and then reevaluate the situation for dose reduction. I would like to see him again in 6 months to see how he is doing. Check a d-dimer at that time. I do not see the need to get an extended special hematology lab profile since it will not change the clinical recommendation.     Levert Feinstein, MD 05/28/2016, 7:31 PM

## 2016-05-28 NOTE — Patient Instructions (Signed)
Return visit 6 months.

## 2016-12-11 ENCOUNTER — Encounter: Payer: Self-pay | Admitting: Oncology

## 2016-12-11 ENCOUNTER — Ambulatory Visit (INDEPENDENT_AMBULATORY_CARE_PROVIDER_SITE_OTHER): Payer: 59 | Admitting: Oncology

## 2016-12-11 VITALS — BP 136/71 | HR 64 | Temp 98.0°F | Ht 70.0 in | Wt 187.9 lb

## 2016-12-11 DIAGNOSIS — I2699 Other pulmonary embolism without acute cor pulmonale: Secondary | ICD-10-CM

## 2016-12-11 DIAGNOSIS — I82492 Acute embolism and thrombosis of other specified deep vein of left lower extremity: Secondary | ICD-10-CM

## 2016-12-11 DIAGNOSIS — Z7901 Long term (current) use of anticoagulants: Secondary | ICD-10-CM

## 2016-12-11 DIAGNOSIS — I824Y2 Acute embolism and thrombosis of unspecified deep veins of left proximal lower extremity: Secondary | ICD-10-CM

## 2016-12-11 DIAGNOSIS — Z880 Allergy status to penicillin: Secondary | ICD-10-CM

## 2016-12-11 NOTE — Patient Instructions (Signed)
Decrease Xarelto to 10 mg daily Return visit with Hematology in 1 year

## 2016-12-11 NOTE — Progress Notes (Signed)
Hematology and Oncology Follow Up Visit  Dwayne [name] [id number] 07/09/1946 71 y.o. 12/11/2016 1:15 PM   Principle Diagnosis: Encounter Diagnoses  Name Primary?  . Acute deep vein thrombosis (DVT) of other specified vein of left lower extremity (HCC) Yes  . Bilateral pulmonary embolism [facility](HCC)    Interim History:  [id number] year old man I evaluated in July 2017. History detailed in my office consultation. Briefly, he developed a provoked DVT of his left lower extremity despite 10 days of aspirin taken post elective arthroscopic surgery in August 2016. He was anticoagulated with Xarelto for 3 months. In March 2017, he developed a cough, dyspnea, and decreased endurance when he ran. He was found to have bilateral pulmonary emboli on CT scan done 03/03/2016. He had no problem with his legs at that time but Doppler study showed acute DVT again on the left extending all the way up through the left common femoral veins. He was put back on anticoagulation. Special hematology labs did not reveal an obvious inherited or acquired coagulation defect. He had a false positive lupus anticoagulant since the test was done while on Xarelto. There was no suspicion for occult malignancy. Family history negative. I advised continuing full dose anticoagulation for 1 year then reevaluating.  He has done well. No dyspnea, chest pain, palpitations, or recurrent leg swelling. He is back to his full activities although he did stop running. He is trying to walk 10,000 steps a day. He has had no other interim medical problems.  With respect to health maintenance, he deferred to have a colonoscopy but did have the: Cologuard DNA test read and it was reported to him as negative.  Medications: reviewed  Allergies:  Allergies  Allergen Reactions  . Penicillins     Review of Systems: See interim history Remaining ROS negative:   Physical Exam: Blood pressure 136/71, pulse 64, temperature 98 F (36.7 C), temperature source  Oral, height 5\' 10"  (1.778 m), weight 187 lb 14.4 oz (85.2 kg), SpO2 99 %. Wt Readings from Last 3 Encounters:  12/11/16 187 lb 14.4 oz (85.2 kg)  05/28/16 178 lb 1.6 oz (80.8 kg)  03/03/16 182 lb (82.6 kg)     General appearance: Well-nourished Caucasian man HENNT: Pharynx no erythema, exudate, mass, or ulcer. No thyromegaly or thyroid nodules Lymph nodes: No cervical, supraclavicular, or axillary lymphadenopathy Breasts Lungs: Clear to auscultation, resonant to percussion throughout Heart: Regular rhythm, no murmur, no gallop, no rub, no click, no edema Abdomen: Soft, nontender, normal bowel sounds, no mass, no organomegaly Extremities: No edema, no calf tenderness Calf measurements: Left: 39 cm, right 39.5 Ankle measurements: Left 24.5 cm, right 23.5 Musculoskeletal: no joint deformities GU:  Vascular: Carotid pulses 2+, no bruits, Neurologic: Alert, oriented, PERRLA, optic discs sharp and vessels normal on the left., no hemorrhage or exudate, I could not get a good look at his right fundus. cranial nerves grossly normal, motor strength 5 over 5, reflexes 1+ symmetric, upper body coordination normal, gait normal, Skin: No rash or ecchymosis  Lab Results: CBC W/Diff    Component Value Date/Time   WBC 13.3 (H) 03/03/2016 1740   RBC 5.20 03/03/2016 1740   HGB 15.3 03/03/2016 1740   HCT 43.6 03/03/2016 1740   PLT 191 03/03/2016 1740   MCV 83.8 03/03/2016 1740   MCV 87.5 01/23/2015 1147   MCH 29.4 03/03/2016 1740   MCHC 35.1 03/03/2016 1740   RDW 12.6 03/03/2016 1740   LYMPHSABS 1.5 03/03/2016 1740   MONOABS 1.6 (H) 03/03/2016  1740   EOSABS 0.2 03/03/2016 1740   BASOSABS 0.0 03/03/2016 1740     Chemistry      Component Value Date/Time   NA 132 (L) 03/03/2016 1740   K 3.7 03/03/2016 1740   CL 94 (L) 03/03/2016 1740   CO2 29 03/03/2016 1740   BUN 17 03/03/2016 1740   CREATININE 0.99 03/03/2016 1740   CREATININE 0.87 07/27/2015 1154      Component Value Date/Time    CALCIUM 9.2 03/03/2016 1740       Radiological Studies: No results found.  Impression:  Initial provoked then subsequent unprovoked extensive proximal DVT with associated bilateral PE  We discussed recent updates in the field. We now have a second study published in July 2017 with Xarelto which reinforces the previous study done with Eliquis and published in 2013. Patients who were on full dose anticoagulation for 6-12 months who were not at high risk but the clinician was uncertain as to how long to continue anticoagulation, were randomized to continue full dose Xarelto, 50% dose reduction of the Xarelto, or aspirin 100 mg daily. There was a 4 fold increased risk of rethrombosis on the aspirin limb. There was equivalent protection against rethrombosis on both the full and reduced doses of Xarelto. There was no significant difference in major bleeding on any arm including aspirin. The choices for Dwayne [name] would be to continue full dose versus reduced dose Xarelto. I feel comfortable based on these data to recommend dose reduction at this time. We discussed some of the uncertainties about the study data with respect to the fact that patients were only followed for 1 year on the reduced doses. That being said, the maximum risk for a second caught after the first clot usually occurs within the first year and there were no signals to suggest increased clotting risk at reduced dose at least for the selected study populations in both of these studies. Of note, the Eliquis study actually had the majority of patients with unprovoked blood clots. Xarelto study had about 60%. Patient is comfortable reducing his dose to 10 mg daily. He'll get a 90 day prescription through his primary care physician's office. I will see him again in one year.  Recommendation: Continue Xarelto at a 50% dose reduction equals 10 mg daily with annual reevaluation.  CC Patient Care Team: Marden Noble, MD as PCP - General  (Internal Medicine)   Levert Feinstein, MD 1/16/20181:15 PM

## 2016-12-20 ENCOUNTER — Encounter: Payer: Self-pay | Admitting: Family Medicine

## 2016-12-20 ENCOUNTER — Ambulatory Visit (INDEPENDENT_AMBULATORY_CARE_PROVIDER_SITE_OTHER): Payer: 59 | Admitting: Family Medicine

## 2016-12-20 ENCOUNTER — Ambulatory Visit (INDEPENDENT_AMBULATORY_CARE_PROVIDER_SITE_OTHER): Payer: 59

## 2016-12-20 VITALS — BP 132/70 | HR 77 | Temp 102.3°F | Resp 18 | Wt 184.2 lb

## 2016-12-20 DIAGNOSIS — R059 Cough, unspecified: Secondary | ICD-10-CM

## 2016-12-20 DIAGNOSIS — R05 Cough: Secondary | ICD-10-CM

## 2016-12-20 DIAGNOSIS — R509 Fever, unspecified: Secondary | ICD-10-CM | POA: Diagnosis not present

## 2016-12-20 LAB — POCT INFLUENZA A/B
Influenza A, POC: NEGATIVE
Influenza B, POC: NEGATIVE

## 2016-12-20 LAB — POCT CBC
GRANULOCYTE PERCENT: 84 % — AB (ref 37–80)
HCT, POC: 40.6 % — AB (ref 43.5–53.7)
HEMOGLOBIN: 14.7 g/dL (ref 14.1–18.1)
Lymph, poc: 1.1 (ref 0.6–3.4)
MCH, POC: 30.9 pg (ref 27–31.2)
MCHC: 36.3 g/dL — AB (ref 31.8–35.4)
MCV: 85.1 fL (ref 80–97)
MID (cbc): 0.2 (ref 0–0.9)
MPV: 7.4 fL (ref 0–99.8)
PLATELET COUNT, POC: 130 10*3/uL — AB (ref 142–424)
POC GRANULOCYTE: 6.9 (ref 2–6.9)
POC LYMPH PERCENT: 14 %L (ref 10–50)
POC MID %: 2 %M (ref 0–12)
RBC: 4.78 M/uL (ref 4.69–6.13)
RDW, POC: 12.7 %
WBC: 8.2 10*3/uL (ref 4.6–10.2)

## 2016-12-20 MED ORDER — ACETAMINOPHEN 500 MG PO CHEW: 1000.0000 mg | CHEWABLE_TABLET | Freq: Four times a day (QID) | ORAL | 0 refills | Status: DC | PRN

## 2016-12-20 MED ORDER — OSELTAMIVIR PHOSPHATE 75 MG PO CAPS: 75.0000 mg | ORAL_CAPSULE | Freq: Two times a day (BID) | ORAL | 0 refills | Status: DC

## 2016-12-20 MED ORDER — PREDNISONE 20 MG PO TABS: 40.0000 mg | ORAL_TABLET | Freq: Every day | ORAL | 0 refills | Status: DC

## 2016-12-20 MED ORDER — HYDROCOD POLST-CPM POLST ER 10-8 MG/5ML PO SUER: 5.0000 mL | Freq: Two times a day (BID) | ORAL | 0 refills | Status: DC | PRN

## 2016-12-20 NOTE — Patient Instructions (Addendum)
Take Tamiflu 75 mg twice daily x 5 days. For wheezing, take 40 mg of prednisone with breakfast or lunch x 5 days. Take Tussionex 5 ml every 12 hours as needed for cough.  Return for follow-up if symptoms worsen.   IF you received an x-ray today, you will receive an invoice from [facility]. Please contact [facility] at [phone] with questions or concerns regarding your invoice.   IF you received labwork today, you will receive an invoice from [facility]. Please contact [facility] at [phone] with questions or concerns regarding your invoice.   Our billing staff will not be able to assist you with questions regarding bills from these companies.  You will be contacted with the lab results as soon as they are available. The fastest way to get your results is to activate your My Chart account. Instructions are located on the last page of this paperwork. If you have not heard from [place] regarding the results in 2 weeks, please contact this office.      Influenza, Adult Influenza ("the flu") is an infection in the lungs, nose, and throat (respiratory tract). It is caused by a virus. The flu causes many common cold symptoms, as well as a high fever and body aches. It can make you feel very sick. The flu spreads easily from person to person (is contagious). Getting a flu shot (influenza vaccination) every year is the best way to prevent the flu. Follow these instructions at home:  Take over-the-counter and prescription medicines only as told by your doctor.  Use a cool mist humidifier to add moisture (humidity) to the air in your home. This can make it easier to breathe.  Rest as needed.  Drink enough fluid to keep your pee (urine) clear or pale yellow.  Cover your mouth and nose when you cough or sneeze.  Wash your hands with soap and water often, especially after you cough or sneeze. If you cannot use soap and water, use hand sanitizer.  Stay home from work or school  as told by your doctor. Unless you are visiting your doctor, try to avoid leaving home until your fever has been gone for 24 hours without the use of medicine.  Keep all follow-up visits as told by your doctor. This is important. How is this prevented?  Getting a yearly (annual) flu shot is the best way to avoid getting the flu. You may get the flu shot in late summer, fall, or winter. Ask your doctor when you should get your flu shot.  Wash your hands often or use hand sanitizer often.  Avoid contact with people who are sick during cold and flu season.  Eat healthy foods.  Drink plenty of fluids.  Get enough sleep.  Exercise regularly. Contact a doctor if:  You get new symptoms.  You have:  Chest pain.  Watery poop (diarrhea).  A fever.  Your cough gets worse.  You start to have more mucus.  You feel sick to your stomach (nauseous).  You throw up (vomit). Get help right away if:  You start to be short of breath or have trouble breathing.  Your skin or nails turn a bluish color.  You have very bad pain or stiffness in your neck.  You get a sudden headache.  You get sudden pain in your face or ear.  You cannot stop throwing up. This information is not intended to replace advice given to you by your health care provider. Make sure you discuss any questions  you have with your health care provider. Document Released: 08/21/2008 Document Revised: 04/19/2016 Document Reviewed: 09/06/2015 Elsevier Interactive Patient Education  2017 [employer]

## 2016-12-20 NOTE — Progress Notes (Signed)
Patient ID: Dwayne [name], male    DOB: 1946/05/04, 71 y.o.   MRN: [id number]  PCP: Pearla Dubonnet, MD  Chief Complaint  Patient presents with  . URI    x 4 days cough and fever    Subjective:  HPI 71 year old male presents for evaluation of cough and fever x 4 days. Past medical hx of DVT and PE's. Anticoagulated chronically with Xarelto. Reports that he felt poor initially, and has subsequently developed a cough and fever. Cough is non productive, although he reports that his chest rattles when he coughs. He has missed one day of work. Appetite been good although he reports he could increase fluid intake.  Social History   Social History  . Marital status: Married    Spouse name: N/A  . Number of children: N/A  . Years of education: N/A   Occupational History  . Not on file.   Social History Main Topics  . Smoking status: Never Smoker  . Smokeless tobacco: Never Used  . Alcohol use 0.0 oz/week     Comment: occ  . Drug use: No  . Sexual activity: Not on file   Other Topics Concern  . Not on file   Social History Narrative  . No narrative on file    Family History  Problem Relation Age of Onset  . Stroke Father   . Diabetes Father   . Stroke Maternal Grandmother   . Cancer Paternal Grandfather    Review of Systems See HPI Patient Active Problem List   Diagnosis Date Noted  . Bilateral pulmonary embolism (HCC) 05/28/2016  . DVT of lower limb, acute (HCC) 07/27/2015   Allergies  Allergen Reactions  . Penicillins     Prior to Admission medications   Medication Sig Start Date End Date Taking? Authorizing Provider  lisinopril-hydrochlorothiazide (PRINZIDE,ZESTORETIC) 10-12.5 MG per tablet Take 1 tablet by mouth daily.   Yes Historical Provider, MD  Multiple Vitamin (MULTIVITAMIN) capsule Take 1 capsule by mouth daily.   Yes Historical Provider, MD  Rivaroxaban 15 & 20 MG TBPK Take 15 mg by mouth 2 (two) times daily. Take as directed on package:  Start with one 15mg  tablet by mouth twice a day with food. On Day 22, switch to one 20mg  tablet once a day with food. 03/03/16  Yes Melene Plan, DO    Past Medical, Surgical Family and Social History reviewed and updated.    Objective:   Today's Vitals   12/20/16 1151  BP: 132/70  Pulse: 77  Resp: 18  Temp: (!) 102.3 F (39.1 C)  TempSrc: Oral  SpO2: 97%  Weight: 184 lb 3.2 oz (83.6 kg)    Wt Readings from Last 3 Encounters:  12/20/16 184 lb 3.2 oz (83.6 kg)  12/11/16 187 lb 14.4 oz (85.2 kg)  05/28/16 178 lb 1.6 oz (80.8 kg)   Physical Exam  Constitutional: He is oriented to person, place, and time. He appears well-developed and well-nourished.  Non-toxic appearance. He has a sickly appearance. He does not appear ill. No distress.  HENT:  Head: Normocephalic and atraumatic.  Eyes: Conjunctivae and EOM are normal. Pupils are equal, round, and reactive to light.  Neck: Normal range of motion.  Cardiovascular: Normal rate, regular rhythm, normal heart sounds and intact distal pulses.   Pulmonary/Chest: He has wheezes. He exhibits tenderness.  Musculoskeletal: Normal range of motion.  Lymphadenopathy:    He has cervical adenopathy.  Neurological: He is alert and oriented to person, place,  and time.  Skin: Skin is warm and dry.  Psychiatric: He has a normal mood and affect. His behavior is normal. Judgment and thought content normal.     Assessment & Plan:  1. Fever in adult, likely related influenza , although POCT testing was negative, patient has a significant cough, wheezing, and fever greater than 101.5. Will empirically  for influenza. - POCT Influenza A/B - POCT CBC 2. Cough - DG Chest 2 View-no evidence of pneumonia   Plan: -Start oseltamivir (Tamiflu) 75 mg, 2 times daily x 5 days. -For cough, chlorpheniramine-hydrocodone (Tussionex) 5 ml every 12 hours. Medication causes drowsiness and or sedation.  -Take Tylenol only, every 4-6 hours for fever.  Return for  follow-up as needed.   Godfrey PickKimberly S. Tiburcio PeaHarris, MSN, FNP-C [facility] [phone]

## 2016-12-25 ENCOUNTER — Encounter: Payer: Self-pay | Admitting: Oncology

## 2016-12-25 ENCOUNTER — Other Ambulatory Visit: Payer: Self-pay | Admitting: Oncology

## 2016-12-25 ENCOUNTER — Telehealth: Payer: Self-pay

## 2016-12-25 ENCOUNTER — Ambulatory Visit (INDEPENDENT_AMBULATORY_CARE_PROVIDER_SITE_OTHER): Payer: 59 | Admitting: Oncology

## 2016-12-25 ENCOUNTER — Ambulatory Visit (HOSPITAL_COMMUNITY)
Admission: RE | Admit: 2016-12-25 | Discharge: 2016-12-25 | Disposition: A | Discharge status: Home / Self Care | Payer: 59 | Source: Ambulatory Visit | Attending: Oncology | Admitting: Oncology

## 2016-12-25 VITALS — BP 154/73 | HR 90 | Temp 98.6°F | Ht 70.0 in | Wt 188.1 lb

## 2016-12-25 DIAGNOSIS — X58XXXA Exposure to other specified factors, initial encounter: Secondary | ICD-10-CM | POA: Diagnosis not present

## 2016-12-25 DIAGNOSIS — R58 Hemorrhage, not elsewhere classified: Secondary | ICD-10-CM | POA: Diagnosis not present

## 2016-12-25 DIAGNOSIS — Z86711 Personal history of pulmonary embolism: Secondary | ICD-10-CM

## 2016-12-25 DIAGNOSIS — Z86718 Personal history of other venous thrombosis and embolism: Secondary | ICD-10-CM | POA: Diagnosis not present

## 2016-12-25 DIAGNOSIS — Z7901 Long term (current) use of anticoagulants: Secondary | ICD-10-CM

## 2016-12-25 DIAGNOSIS — Z880 Allergy status to penicillin: Secondary | ICD-10-CM

## 2016-12-25 DIAGNOSIS — S301XXA Contusion of abdominal wall, initial encounter: Secondary | ICD-10-CM | POA: Diagnosis not present

## 2016-12-25 DIAGNOSIS — R233 Spontaneous ecchymoses: Secondary | ICD-10-CM | POA: Insufficient documentation

## 2016-12-25 DIAGNOSIS — K683 Retroperitoneal hematoma: Secondary | ICD-10-CM | POA: Insufficient documentation

## 2016-12-25 DIAGNOSIS — M7981 Nontraumatic hematoma of soft tissue: Secondary | ICD-10-CM

## 2016-12-25 DIAGNOSIS — R05 Cough: Secondary | ICD-10-CM | POA: Diagnosis not present

## 2016-12-25 HISTORY — DX: Hemorrhage, not elsewhere classified: R58

## 2016-12-25 NOTE — Progress Notes (Signed)
Hematology and Oncology Follow Up Visit  Dwayne [name] [id number] 1946/08/29 71 y.o. 12/25/2016 3:34 PM   Principle Diagnosis: Encounter Diagnoses  Name Primary?  . Rule out Retroperitoneal bleed   . Spontaneous hematoma of abdominal wall Yes     Interim History:   Unscheduled visit for this 71 year old man on chronic Xarelto anticoagulation. He sustained an initial provoked proximal left lower extremity DVT a few weeks after arthroscopic surgery on his knee in August 2016. He developed subsequent extensive proximal DVT of through the common femoral veins and concomitant bilateral pulmonary emboli about 5 months after he stopped initial anticoagulation in April 2017 and was put back on Xarelto. I just saw him for a follow-up visit on 12/11/2016. I advised him to decrease his Xarelto dose to 10 mg when he finished his current bottle of the 20 mg tablets. He had a recent flulike illness and has been coughing for about a week. He didn't decrease the Xarelto dose until Saturday, January 27. Today he noticed a large area of ecchymoses across his lower abdomen and involving his penis and scrotum. No significant abdominal pain or tightness. No groin pain. No paresthesias. No hematuria. No hematochezia. He was not put on any antibiotics for his cough.  Medications: reviewed  Allergies:  Allergies  Allergen Reactions  . Penicillins     Review of Systems: See interim history Remaining ROS negative:   Physical Exam: Blood pressure (!) 154/73, pulse 90, temperature 98.6 F (37 C), temperature source Oral, height 5\' 10"  (1.778 m), weight 188 lb 1.6 oz (85.3 kg), SpO2 97 %. Wt Readings from Last 3 Encounters:  12/25/16 188 lb 1.6 oz (85.3 kg)  12/20/16 184 lb 3.2 oz (83.6 kg)  12/11/16 187 lb 14.4 oz (85.2 kg)     General appearance: Thin Caucasian man HENNT: Pharynx no erythema, exudate, mass, or ulcer. No thyromegaly or thyroid nodules Lymph nodes: No cervical, supraclavicular, or  axillary lymphadenopathy Breasts:  Lungs: Clear to auscultation, resonant to percussion throughout Heart: Regular rhythm, no murmur, no gallop, no rub, no click, no edema Abdomen: Soft, nontender, moderately distended but not tense, no obvious fluid wave, normal bowel sounds, no mass, no organomegaly. Extensive ecchymosis across the entire lower abdomen reproducing the area where his belt crosses his abdomen. Extremities: No edema, no calf tenderness Musculoskeletal: no joint deformities GU: Penile and scrotal ecchymosis. Vascular: , distal pulses: Dorsalis pedis and posterior tibial pulses 2+ symmetric Neurologic: Alert, oriented, cranial nerves grossly normal, motor strength 5 over 5, reflexes 2+ symmetric, upper body coordination normal, gait normal, Skin: No rash or ecchymosis  Lab Results: CBC W/Diff    Component Value Date/Time   WBC 8.2 12/20/2016 1243   WBC 13.3 (H) 03/03/2016 1740   RBC 4.78 12/20/2016 1243   RBC 5.20 03/03/2016 1740   HGB 14.7 12/20/2016 1243   HGB 15.3 03/03/2016 1740   HCT 40.6 (A) 12/20/2016 1243   HCT 43.6 03/03/2016 1740   PLT 191 03/03/2016 1740   MCV 85.1 12/20/2016 1243   MCH 30.9 12/20/2016 1243   MCH 29.4 03/03/2016 1740   MCHC 36.3 (A) 12/20/2016 1243   MCHC 35.1 03/03/2016 1740   RDW 12.6 03/03/2016 1740   LYMPHSABS 1.5 03/03/2016 1740   MONOABS 1.6 (H) 03/03/2016 1740   EOSABS 0.2 03/03/2016 1740   BASOSABS 0.0 03/03/2016 1740     Chemistry      Component Value Date/Time   NA 132 (L) 03/03/2016 1740   K 3.7 03/03/2016 1740  CL 94 (L) 03/03/2016 1740   CO2 29 03/03/2016 1740   BUN 17 03/03/2016 1740   CREATININE 0.99 03/03/2016 1740   CREATININE 0.87 07/27/2015 1154      Component Value Date/Time   CALCIUM 9.2 03/03/2016 1740       Radiological Studies: Dg Chest 2 View  Result Date: 12/20/2016 CLINICAL DATA:  Cough and fever. EXAM: CHEST  2 VIEW COMPARISON:  04/22/2016 . FINDINGS: Mediastinum and hilar structures normal.  Lungs are clear. Heart size normal. No pleural effusion or pneumothorax. IMPRESSION: No acute cardiopulmonary disease. Electronically Signed   By: Maisie Fushomas  Register   On: 12/20/2016 12:52    Impression:  Extensive lower abdominal and scrotal hematoma following a week of coughing in a man on therapeutic anticoagulation for previous DVT/PE. Normal neurovascular exam. Unlikely that he had a retroperitoneal bleed but the extent of the ecchymoses and the fact that he has tracked down to his scrotal area raises this possibility.  Plan: Hold anticoagulation Stat ultrasound of the abdomen/retroperitoneum. Check CBC I will advise the patient when he can resume the anticoagulation. His cough symptoms are resolving.  CC: Patient Care Team: Marden Nobleobert Gates, MD as PCP - General (Internal Medicine)  Radiologist just called me. A noncontrast CT of the abdomen and pelvis was done instead of an ultrasound. Patient has a large right rectus sheath hematoma approximately 400 mL with the blood tracking down into the inguinal region which explains his scrotal hematoma. I still don't have his CBC back but he was hemodynamically stable:  blood pressure 154/73 pulse of 90. Abdomen was nontender. I will call and advise the patient to stay off his Xarelto for the next few days.  Cephas DarbyJames Trameka Dorough, MD, FACP  Hematology-Oncology/Internal Medicine  1/30/20183:34 PM

## 2016-12-25 NOTE — Patient Instructions (Signed)
Stop Xarelto until further instruction Abdominal Ultrasound today to evaluate extent of bleeding To lab today to check blood count I will advise you when to go back on Xarelto

## 2016-12-25 NOTE — Telephone Encounter (Signed)
Patient called reports that he has excessive bruising on his stomach from coughing. Advised patient to come for visit today [facility] will be in here in 30 minutes

## 2016-12-26 ENCOUNTER — Telehealth: Payer: Self-pay | Admitting: *Deleted

## 2016-12-26 ENCOUNTER — Ambulatory Visit (HOSPITAL_COMMUNITY): Payer: 59

## 2016-12-26 LAB — CBC WITH DIFFERENTIAL/PLATELET
BASOS ABS: 0 10*3/uL (ref 0.0–0.2)
Basos: 0 %
EOS (ABSOLUTE): 0 10*3/uL (ref 0.0–0.4)
EOS: 0 %
HEMOGLOBIN: 10 g/dL — AB (ref 13.0–17.7)
Hematocrit: 30.7 % — ABNORMAL LOW (ref 37.5–51.0)
IMMATURE GRANS (ABS): 0.1 10*3/uL (ref 0.0–0.1)
Immature Granulocytes: 1 %
LYMPHS: 11 %
Lymphocytes Absolute: 1 10*3/uL (ref 0.7–3.1)
MCH: 28 pg (ref 26.6–33.0)
MCHC: 32.6 g/dL (ref 31.5–35.7)
MCV: 86 fL (ref 79–97)
MONOCYTES: 5 %
Monocytes Absolute: 0.5 10*3/uL (ref 0.1–0.9)
NEUTROS ABS: 8.2 10*3/uL — AB (ref 1.4–7.0)
Neutrophils: 83 %
Platelets: 284 10*3/uL (ref 150–379)
RBC: 3.57 x10E6/uL — ABNORMAL LOW (ref 4.14–5.80)
RDW: 13.6 % (ref 12.3–15.4)
WBC: 9.7 10*3/uL (ref 3.4–10.8)

## 2016-12-26 NOTE — Telephone Encounter (Signed)
Pt called / informed " Hb down from 15 to 10 but in safe range.  He should start an OTC iron supplement to help his bone marrow make new blood. He can buy ferrous sulfate and take TID." per Dr Cyndie ChimeGranfortuna. Pt voiced understanding. Scheduled lab appt for Friday @ 1145 AM.

## 2016-12-26 NOTE — Telephone Encounter (Signed)
-----   Message from Levert FeinsteinJames M Granfortuna, MD sent at 12/26/2016  7:46 AM EST ----- Call pt: Hb down from 15 to 10 but in safe range. Doris will call with time to repeat lab on Friday. He should start an OTC iron supplement to help his bone marrow make new blood. He can buy ferrous sulfate and take TID.

## 2016-12-27 ENCOUNTER — Telehealth: Payer: Self-pay | Admitting: Internal Medicine

## 2016-12-27 NOTE — Telephone Encounter (Signed)
APT. REMINDER CALL, LMTCB °

## 2016-12-28 ENCOUNTER — Other Ambulatory Visit: Payer: Self-pay | Admitting: Oncology

## 2016-12-28 ENCOUNTER — Telehealth: Payer: Self-pay | Admitting: *Deleted

## 2016-12-28 ENCOUNTER — Other Ambulatory Visit (INDEPENDENT_AMBULATORY_CARE_PROVIDER_SITE_OTHER): Payer: 59

## 2016-12-28 DIAGNOSIS — S301XXA Contusion of abdominal wall, initial encounter: Secondary | ICD-10-CM

## 2016-12-28 DIAGNOSIS — M7981 Nontraumatic hematoma of soft tissue: Secondary | ICD-10-CM | POA: Diagnosis not present

## 2016-12-28 DIAGNOSIS — S301XXD Contusion of abdominal wall, subsequent encounter: Secondary | ICD-10-CM

## 2016-12-28 LAB — CBC WITH DIFFERENTIAL/PLATELET
Basophils Absolute: 0 10*3/uL (ref 0.0–0.1)
Basophils Relative: 0 %
EOS PCT: 1 %
Eosinophils Absolute: 0.1 10*3/uL (ref 0.0–0.7)
HEMATOCRIT: 30.2 % — AB (ref 39.0–52.0)
Hemoglobin: 10.1 g/dL — ABNORMAL LOW (ref 13.0–17.0)
LYMPHS PCT: 28 %
Lymphs Abs: 2.9 10*3/uL (ref 0.7–4.0)
MCH: 28.9 pg (ref 26.0–34.0)
MCHC: 33.4 g/dL (ref 30.0–36.0)
MCV: 86.5 fL (ref 78.0–100.0)
MONO ABS: 0.9 10*3/uL (ref 0.1–1.0)
MONOS PCT: 9 %
NEUTROS ABS: 6.2 10*3/uL (ref 1.7–7.7)
Neutrophils Relative %: 62 %
PLATELETS: 322 10*3/uL (ref 150–400)
RBC: 3.49 MIL/uL — ABNORMAL LOW (ref 4.22–5.81)
RDW: 13.4 % (ref 11.5–15.5)
WBC: 10.4 10*3/uL (ref 4.0–10.5)

## 2016-12-28 NOTE — Telephone Encounter (Signed)
He should stay on the iron for 6-8 weeks

## 2016-12-28 NOTE — Telephone Encounter (Signed)
We can check another CBC in 6 weeks

## 2016-12-28 NOTE — Telephone Encounter (Signed)
Pt called / informed " no further fall in hemoglobin: stable at 10 so he has stopped bleeding.I would wait one week the go back on Xarelto at 10 mg daily." per Dr Cyndie ChimeGranfortuna. Pt voiced understanding and repeated instructions. States he's still taking iron tabs. Wants to know if he needs anymore in the future? Thanks

## 2016-12-28 NOTE — Telephone Encounter (Signed)
Wants to know if he needs more labs?

## 2016-12-28 NOTE — Telephone Encounter (Signed)
Pt informed need to check CBC in 6 weeks; appt scheduled March 15th. Also informed to stay on iron tabs for another 6-8 weeks (until next CBC). Voiced understanding.

## 2016-12-28 NOTE — Telephone Encounter (Signed)
-----   Message from Levert FeinsteinJames M Granfortuna, MD sent at 12/28/2016  2:33 PM EST ----- Call pt: no further fall in hemoglobin: stable at 10 so he has stopped bleeding.I would wait one week the go back on Xarelto at 10 mg daily.

## 2017-02-07 ENCOUNTER — Other Ambulatory Visit (INDEPENDENT_AMBULATORY_CARE_PROVIDER_SITE_OTHER): Payer: 59

## 2017-02-07 DIAGNOSIS — M7981 Nontraumatic hematoma of soft tissue: Secondary | ICD-10-CM

## 2017-02-07 DIAGNOSIS — S301XXD Contusion of abdominal wall, subsequent encounter: Secondary | ICD-10-CM

## 2017-02-08 LAB — CBC WITH DIFFERENTIAL/PLATELET
Basophils Absolute: 0 10*3/uL (ref 0.0–0.2)
Basos: 0 %
EOS (ABSOLUTE): 0.2 10*3/uL (ref 0.0–0.4)
Eos: 3 %
Hematocrit: 43.4 % (ref 37.5–51.0)
Hemoglobin: 14 g/dL (ref 13.0–17.7)
IMMATURE GRANS (ABS): 0 10*3/uL (ref 0.0–0.1)
IMMATURE GRANULOCYTES: 0 %
LYMPHS: 42 %
Lymphocytes Absolute: 2.8 10*3/uL (ref 0.7–3.1)
MCH: 28.9 pg (ref 26.6–33.0)
MCHC: 32.3 g/dL (ref 31.5–35.7)
MCV: 90 fL (ref 79–97)
Monocytes Absolute: 0.7 10*3/uL (ref 0.1–0.9)
Monocytes: 10 %
NEUTROS ABS: 3.1 10*3/uL (ref 1.4–7.0)
NEUTROS PCT: 45 %
PLATELETS: 210 10*3/uL (ref 150–379)
RBC: 4.84 x10E6/uL (ref 4.14–5.80)
RDW: 13.8 % (ref 12.3–15.4)
WBC: 6.8 10*3/uL (ref 3.4–10.8)

## 2017-02-11 ENCOUNTER — Telehealth: Payer: Self-pay | Admitting: *Deleted

## 2017-02-11 NOTE — Telephone Encounter (Signed)
Pt called / informed "red blood count back to normal " per Dr Cyndie ChimeGranfortuna. Stated he had already viewed labs and stopped taking iron. Wants to know if he can continue stop taking iron or needs to start back? Thanks

## 2017-02-11 NOTE — Telephone Encounter (Signed)
Pt informed ok to stop iron pills per Dr Cyndie ChimeGranfortuna.

## 2017-02-11 NOTE — Telephone Encounter (Signed)
OK to stop.

## 2017-02-11 NOTE — Telephone Encounter (Signed)
-----   Message from Levert FeinsteinJames M Granfortuna, MD sent at 02/08/2017 11:57 AM EDT ----- Call pt: red blood count back to normal

## 2018-01-24 ENCOUNTER — Telehealth: Payer: Self-pay | Admitting: Oncology

## 2018-01-24 NOTE — Telephone Encounter (Signed)
Talked to Dr Cyndie ChimeGranfortuna  - stated he needs to continue taking his medication (Xarelto) and get up/move around 2-3 during the flight and he should be ok.  Called pt's home and work numbers; no answer - left the above message per Dr Cyndie ChimeGranfortuna. And to call for any questions.

## 2018-01-24 NOTE — Telephone Encounter (Signed)
Pt called in reference to being concerned about his blood thinner medications.  Pt leaving Monday on a long 9 hour flight and would like to toknow what to do. Pt is [place] on [place] for about an hour and you can call his Home number.  If after that time please call [phone]

## 2018-03-04 ENCOUNTER — Other Ambulatory Visit: Payer: Self-pay

## 2018-03-04 ENCOUNTER — Ambulatory Visit (INDEPENDENT_AMBULATORY_CARE_PROVIDER_SITE_OTHER): Payer: 59 | Admitting: Oncology

## 2018-03-04 ENCOUNTER — Encounter: Payer: Self-pay | Admitting: Oncology

## 2018-03-04 VITALS — BP 149/70 | HR 68 | Temp 98.0°F | Ht 70.0 in | Wt 191.7 lb

## 2018-03-04 DIAGNOSIS — Z7901 Long term (current) use of anticoagulants: Secondary | ICD-10-CM | POA: Diagnosis not present

## 2018-03-04 DIAGNOSIS — Z79899 Other long term (current) drug therapy: Secondary | ICD-10-CM

## 2018-03-04 DIAGNOSIS — Z862 Personal history of diseases of the blood and blood-forming organs and certain disorders involving the immune mechanism: Secondary | ICD-10-CM

## 2018-03-04 DIAGNOSIS — I10 Essential (primary) hypertension: Secondary | ICD-10-CM

## 2018-03-04 DIAGNOSIS — I2782 Chronic pulmonary embolism: Secondary | ICD-10-CM

## 2018-03-04 DIAGNOSIS — R58 Hemorrhage, not elsewhere classified: Secondary | ICD-10-CM

## 2018-03-04 DIAGNOSIS — I82492 Acute embolism and thrombosis of other specified deep vein of left lower extremity: Secondary | ICD-10-CM

## 2018-03-04 DIAGNOSIS — Z880 Allergy status to penicillin: Secondary | ICD-10-CM | POA: Diagnosis not present

## 2018-03-04 DIAGNOSIS — I825Y2 Chronic embolism and thrombosis of unspecified deep veins of left proximal lower extremity: Secondary | ICD-10-CM

## 2018-03-04 DIAGNOSIS — I2699 Other pulmonary embolism without acute cor pulmonale: Secondary | ICD-10-CM

## 2018-03-04 NOTE — Progress Notes (Signed)
Hematology and Oncology Follow Up Visit  Dwayne [name] [id number] August 12, 1946 72 y.o. 03/04/2018 10:11 AM   Principle Diagnosis: Encounter Diagnoses  Name Primary?  . Bilateral pulmonary embolism ([place])   . Retroperitoneal bleed   . Acute deep vein thrombosis (DVT) of other specified vein of left lower extremity ([place])   . Chronic anticoagulation Yes  . 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 Chronic deep vein thrombosis (DVT) of proximal vein of left lower extremity [facility])   Clinical summary: 72 year old man I evaluated in July 2017. History detailed in my office consultation. Briefly, he developed a provoked DVT of his left lower extremity despite 10 days of aspirin taken post elective arthroscopic surgery in August 2016. He was anticoagulated with Xarelto for 3 months. In March 2017, he developed a cough, dyspnea, and decreased endurance when he ran. He was found to have bilateral pulmonary emboli on CT scan done 03/03/2016. He had no problem with his legs at that time but Doppler study showed acute DVT again on the left extending all the way up through the left common femoral veins. He was put back on anticoagulation. Special hematology labs did not reveal an obvious inherited or acquired coagulation defect. He had a false positive lupus anticoagulant since the test was done while on Xarelto. There was no suspicion for occult malignancy. Family history negative. I advised continuing full dose anticoagulation for 1 year then reevaluating.  At the time of his December 11, 2016 follow-up visit I advised him to decrease his Xarelto to 10 mg daily. He was still finishing up his 20 mg tablets when he presented on January 30 with a large spontaneous rectus sheath hematoma.  Hemoglobin fell from 14.7to 10.0.  Anticoagulation held.  No signs of a retroperitoneal hemorrhage on CT scan.  He was started on iron supplementation.  Hemoglobin was  monitored and stable.  Anticoagulation was resumed at the lower dose.  A follow-up hemoglobin on February 07, 2017 was back to normal at 14 g.  He has had no subsequent bleeding issues on the 10 mg dose.   Interim History: He has had no interim medical problems and no subsequent bleeding issues on 10 mg of Xarelto daily. He has no symptoms of dyspnea chest pain or palpitations.  His left leg still swells when he is up on it for a long time but he is not really having any pain. No change in bowel habit.  Most recent Colo guard testing reported to him as negative. His wife who is now 41 was treated last year for breast cancer but is doing well on hormonal therapy.  1 of his sisters aged 55 is on active treatment for advanced lung cancer.  His daughter 24 is doing well with no health issues.  Medications: reviewed  Allergies:  Allergies  Allergen Reactions  . Penicillins     Review of Systems: See interim history Remaining ROS negative:   Physical Exam: Blood pressure (!) 149/70, pulse 68, temperature 98 F (36.7 C), temperature source Oral, height 5' 10"  (1.778 m), weight 191 lb 11.2 oz (87 kg), SpO2 95 %. Wt Readings from Last 3 Encounters:  03/04/18 191 lb 11.2 oz (87 kg)  12/25/16 188 lb 1.6 oz (85.3 kg)  12/20/16 184 lb 3.2 oz (83.6 kg)     General appearance: Well-nourished Caucasian man HENNT: Pharynx no erythema, exudate, mass, or ulcer. No thyromegaly or thyroid nodules Lymph nodes: No cervical, supraclavicular, or  axillary lymphadenopathy Breasts:  Lungs: Clear to auscultation, resonant to percussion throughout Heart: Regular rhythm, no murmur, no gallop, no rub, no click, no edema Abdomen: Soft, nontender, normal bowel sounds, no mass, no organomegaly Extremities: No edema, no calf tenderness Calf measurements: Left 39 cm, right 38.5 Ankle measurements: Left 23.5 cm, right 23 Musculoskeletal: no joint deformities GU:  Vascular: Carotid pulses 2+, no bruits,   Neurologic: Alert, oriented, PERRLA, optic discs sharp and vessels normal, no hemorrhage or exudate on the left side, disc not well visualized on the right, cranial nerves grossly normal, motor strength 5 over 5, reflexes 1+ symmetric, upper body coordination normal, gait normal, Skin: No rash or ecchymosis  Lab Results: CBC W/Diff    Component Value Date/Time   WBC 6.8 02/07/2017 1139   WBC 10.4 12/28/2016 1117   RBC 4.84 02/07/2017 1139   RBC 3.49 (L) 12/28/2016 1117   HGB 14.0 02/07/2017 1139   HCT 43.4 02/07/2017 1139   PLT 210 02/07/2017 1139   MCV 90 02/07/2017 1139   MCH 28.9 02/07/2017 1139   MCH 28.9 12/28/2016 1117   MCHC 32.3 02/07/2017 1139   MCHC 33.4 12/28/2016 1117   RDW 13.8 02/07/2017 1139   LYMPHSABS 2.8 02/07/2017 1139   MONOABS 0.9 12/28/2016 1117   EOSABS 0.2 02/07/2017 1139   BASOSABS 0.0 02/07/2017 1139     Chemistry      Component Value Date/Time   NA 132 (L) 03/03/2016 1740   K 3.7 03/03/2016 1740   CL 94 (L) 03/03/2016 1740   CO2 29 03/03/2016 1740   BUN 17 03/03/2016 1740   CREATININE 0.99 03/03/2016 1740   CREATININE 0.87 07/27/2015 1154      Component Value Date/Time   CALCIUM 9.2 03/03/2016 1740       Radiological Studies: No results found.  Impression:  1.  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 Plan: Continue chronic anticoagulation with reduced dose Xarelto. I am checking a CBC and a be met today. He will return on an as needed basis over the next year.  I informed him that I will be retiring next April.  If he needs hematology follow-up in the future, this can be arranged through his primary care physician.  2.  Rectus sheath hematoma complication of anticoagulation occurring January 2018. Resolved.  3.  Essential hypertension Reasonably controlled on current medications.  CC: Patient Care Team: Josetta Huddle, MD as PCP - General (Internal Medicine)   Murriel Hopper, MD, [place]   Hematology-Oncology/Internal Medicine     4/9/201910:11 AM

## 2018-03-04 NOTE — Patient Instructions (Signed)
To lab today Return visit as needed 

## 2018-03-05 ENCOUNTER — Telehealth: Payer: Self-pay | Admitting: *Deleted

## 2018-03-05 LAB — CBC WITH DIFFERENTIAL/PLATELET
Basophils Absolute: 0 10*3/uL (ref 0.0–0.2)
Basos: 0 %
EOS (ABSOLUTE): 0.2 10*3/uL (ref 0.0–0.4)
Eos: 3 %
Hematocrit: 44.8 % (ref 37.5–51.0)
Hemoglobin: 14.9 g/dL (ref 13.0–17.7)
IMMATURE GRANS (ABS): 0 10*3/uL (ref 0.0–0.1)
IMMATURE GRANULOCYTES: 0 %
LYMPHS: 39 %
Lymphocytes Absolute: 2.4 10*3/uL (ref 0.7–3.1)
MCH: 29 pg (ref 26.6–33.0)
MCHC: 33.3 g/dL (ref 31.5–35.7)
MCV: 87 fL (ref 79–97)
MONOS ABS: 0.5 10*3/uL (ref 0.1–0.9)
Monocytes: 8 %
NEUTROS PCT: 50 %
Neutrophils Absolute: 3.1 10*3/uL (ref 1.4–7.0)
PLATELETS: 197 10*3/uL (ref 150–379)
RBC: 5.14 x10E6/uL (ref 4.14–5.80)
RDW: 13.6 % (ref 12.3–15.4)
WBC: 6.1 10*3/uL (ref 3.4–10.8)

## 2018-03-05 LAB — BASIC METABOLIC PANEL
BUN/Creatinine Ratio: 17 (ref 10–24)
BUN: 18 mg/dL (ref 8–27)
CALCIUM: 9.2 mg/dL (ref 8.6–10.2)
CO2: 24 mmol/L (ref 20–29)
CREATININE: 1.05 mg/dL (ref 0.76–1.27)
Chloride: 97 mmol/L (ref 96–106)
GFR calc Af Amer: 83 mL/min/{1.73_m2} (ref >59)
GFR, EST NON AFRICAN AMERICAN: 72 mL/min/{1.73_m2} (ref >59)
GLUCOSE: 81 mg/dL (ref 65–99)
POTASSIUM: 4.4 mmol/L (ref 3.5–5.2)
SODIUM: 136 mmol/L (ref 134–144)

## 2018-03-05 NOTE — Telephone Encounter (Signed)
-----   Message from Levert FeinsteinJames M Granfortuna, MD sent at 03/05/2018  8:57 AM EDT ----- Call pt: lab all good; we will forward copy to his PCP

## 2018-03-05 NOTE — Telephone Encounter (Signed)
Informed pt "lab all good; we will forward copy to his PCP" per Dr Cyndie ChimeGranfortuna. Stated thank you.

## 2018-03-05 NOTE — Telephone Encounter (Signed)
Called pt - no answer; left message to give me a call back. 

## 2019-01-26 ENCOUNTER — Encounter: Payer: Self-pay | Admitting: *Deleted

## 2020-10-08 ENCOUNTER — Other Ambulatory Visit: Payer: Self-pay

## 2020-10-08 ENCOUNTER — Emergency Department (HOSPITAL_BASED_OUTPATIENT_CLINIC_OR_DEPARTMENT_OTHER): Payer: 59

## 2020-10-08 ENCOUNTER — Emergency Department (HOSPITAL_BASED_OUTPATIENT_CLINIC_OR_DEPARTMENT_OTHER)
Admission: EM | Admit: 2020-10-08 | Discharge: 2020-10-08 | Disposition: A | Discharge status: Home / Self Care | Payer: 59 | Attending: Emergency Medicine | Admitting: Emergency Medicine

## 2020-10-08 ENCOUNTER — Encounter (HOSPITAL_BASED_OUTPATIENT_CLINIC_OR_DEPARTMENT_OTHER): Payer: Self-pay

## 2020-10-08 DIAGNOSIS — Y9285 Railroad track as the place of occurrence of the external cause: Secondary | ICD-10-CM | POA: Insufficient documentation

## 2020-10-08 DIAGNOSIS — Y9389 Activity, other specified: Secondary | ICD-10-CM | POA: Insufficient documentation

## 2020-10-08 DIAGNOSIS — R410 Disorientation, unspecified: Secondary | ICD-10-CM | POA: Diagnosis not present

## 2020-10-08 DIAGNOSIS — H6123 Impacted cerumen, bilateral: Secondary | ICD-10-CM | POA: Insufficient documentation

## 2020-10-08 DIAGNOSIS — Z7901 Long term (current) use of anticoagulants: Secondary | ICD-10-CM | POA: Diagnosis not present

## 2020-10-08 DIAGNOSIS — W19XXXA Unspecified fall, initial encounter: Secondary | ICD-10-CM

## 2020-10-08 DIAGNOSIS — I10 Essential (primary) hypertension: Secondary | ICD-10-CM | POA: Insufficient documentation

## 2020-10-08 DIAGNOSIS — W228XXA Striking against or struck by other objects, initial encounter: Secondary | ICD-10-CM | POA: Diagnosis not present

## 2020-10-08 DIAGNOSIS — S0993XA Unspecified injury of face, initial encounter: Secondary | ICD-10-CM | POA: Diagnosis present

## 2020-10-08 DIAGNOSIS — S0292XA Unspecified fracture of facial bones, initial encounter for closed fracture: Secondary | ICD-10-CM | POA: Diagnosis not present

## 2020-10-08 DIAGNOSIS — Z79899 Other long term (current) drug therapy: Secondary | ICD-10-CM | POA: Insufficient documentation
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
## 2020-10-08 NOTE — ED Notes (Signed)
ED Provider at bedside. 
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
## 2020-10-08 NOTE — Discharge Instructions (Addendum)
Do NOT blow your nose. Apply cold compresses to your left face for 20 minutes at a time. Take Tylenol as needed as directed for pain. Follow up with Dr. Leta Baptist, call Monday to schedule an appointment. Return to the ER for any concerning symptoms.

## 2021-08-30 ENCOUNTER — Encounter: Payer: Self-pay | Admitting: *Deleted

## 2021-08-30 NOTE — Progress Notes (Signed)
Referral received and chart under review for appointment set up

## 2021-09-29 ENCOUNTER — Telehealth: Payer: Self-pay | Admitting: *Deleted

## 2021-10-03 ENCOUNTER — Other Ambulatory Visit: Payer: Self-pay

## 2021-10-03 ENCOUNTER — Inpatient Hospital Stay: Payer: 59 | Attending: Oncology | Admitting: Oncology

## 2021-10-03 VITALS — BP 137/60 | HR 60 | Temp 97.8°F | Resp 18 | Ht 70.0 in | Wt 165.4 lb

## 2021-10-03 DIAGNOSIS — K4090 Unilateral inguinal hernia, without obstruction or gangrene, not specified as recurrent: Secondary | ICD-10-CM

## 2021-10-03 DIAGNOSIS — Z86718 Personal history of other venous thrombosis and embolism: Secondary | ICD-10-CM

## 2021-10-03 DIAGNOSIS — Z86711 Personal history of pulmonary embolism: Secondary | ICD-10-CM

## 2021-10-03 DIAGNOSIS — Z7901 Long term (current) use of anticoagulants: Secondary | ICD-10-CM | POA: Diagnosis not present

## 2021-10-03 DIAGNOSIS — I10 Essential (primary) hypertension: Secondary | ICD-10-CM | POA: Diagnosis not present

## 2021-10-03 DIAGNOSIS — I82492 Acute embolism and thrombosis of other specified deep vein of left lower extremity: Secondary | ICD-10-CM
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
## 2021-10-27 ENCOUNTER — Other Ambulatory Visit: Payer: Self-pay | Admitting: Surgery

## 2022-01-15 ENCOUNTER — Other Ambulatory Visit: Payer: Self-pay

## 2022-01-15 ENCOUNTER — Encounter (HOSPITAL_BASED_OUTPATIENT_CLINIC_OR_DEPARTMENT_OTHER): Payer: Self-pay | Admitting: Surgery

## 2022-01-17 ENCOUNTER — Encounter (HOSPITAL_BASED_OUTPATIENT_CLINIC_OR_DEPARTMENT_OTHER)
Admission: RE | Admit: 2022-01-17 | Discharge: 2022-01-17 | Disposition: A | Discharge status: Home / Self Care | Payer: 59 | Source: Ambulatory Visit | Attending: Surgery | Admitting: Surgery

## 2022-01-17 DIAGNOSIS — Z01818 Encounter for other preprocedural examination: Secondary | ICD-10-CM | POA: Insufficient documentation

## 2022-01-17 LAB — BASIC METABOLIC PANEL
Anion gap: 8 (ref 5–15)
BUN: 23 mg/dL (ref 8–23)
CO2: 29 mmol/L (ref 22–32)
Calcium: 9.1 mg/dL (ref 8.9–10.3)
Chloride: 100 mmol/L (ref 98–111)
Creatinine, Ser: 0.96 mg/dL (ref 0.61–1.24)
GFR, Estimated: >60 mL/min (ref >60)
Glucose, Bld: 90 mg/dL (ref 70–99)
Potassium: 4.2 mmol/L (ref 3.5–5.1)
Sodium: 137 mmol/L (ref 135–145)

## 2022-01-17 MED ORDER — CHLORHEXIDINE GLUCONATE CLOTH 2 % EX PADS: 6.0000 | MEDICATED_PAD | Freq: Once | CUTANEOUS | Status: DC

## 2022-01-17 MED ORDER — ENSURE PRE-SURGERY PO LIQD: 296.0000 mL | Freq: Once | ORAL | Status: DC

## 2022-01-17 NOTE — Progress Notes (Signed)

## 2022-01-17 NOTE — Progress Notes (Signed)
Patient seen by Dr. Armond Hang for pre op anesthesia consult. Patient asymptomatic, no SOB, no chest pain and able to complete daily exercise. Okay to proceed with surgery as scheduled

## 2022-01-17 NOTE — Progress Notes (Signed)
Patient seen by Dr. Armond Hang for pre op anesthesia consult. Okay to proceed with surgery as scheduled

## 2022-01-21 NOTE — Anesthesia Preprocedure Evaluation (Addendum)
Anesthesia Evaluation  Patient identified by MRN, date of birth, ID band Patient awake    Reviewed: Allergy & Precautions, NPO status , Patient's Chart, lab work & pertinent test results  Airway Mallampati: II  TM Distance: >3 FB Neck ROM: Limited    Dental no notable dental hx.    Pulmonary neg pulmonary ROS,    Pulmonary exam normal breath sounds clear to auscultation       Cardiovascular hypertension, Pt. on medications Normal cardiovascular exam Rhythm:Regular Rate:Normal  DVT/PE on xarelto   Neuro/Psych negative neurological ROS     GI/Hepatic negative GI ROS, Neg liver ROS,   Endo/Other  negative endocrine ROS  Renal/GU negative Renal ROS  negative genitourinary   Musculoskeletal  (+) Arthritis , Osteoarthritis,  Cervical stenosis   Abdominal   Peds negative pediatric ROS (+)  Hematology negative hematology ROS (+)   Anesthesia Other Findings   Reproductive/Obstetrics                            Anesthesia Physical Anesthesia Plan  ASA: 3  Anesthesia Plan: General and Regional   Post-op Pain Management: Tylenol PO (pre-op)*   Induction: Intravenous  PONV Risk Score and Plan: 2 and Treatment may vary due to age or medical condition, Ondansetron and Dexamethasone  Airway Management Planned: LMA  Additional Equipment: None  Intra-op Plan:   Post-operative Plan: Extubation in OR  Informed Consent: I have reviewed the patients History and Physical, chart, labs and discussed the procedure including the risks, benefits and alternatives for the proposed anesthesia with the patient or authorized representative who has indicated his/her understanding and acceptance.     Dental advisory given  Plan Discussed with: CRNA, Anesthesiologist and Surgeon  Anesthesia Plan Comments: (TAP block with Exparel. GA/LMA. )       Anesthesia Quick Evaluation

## 2022-01-21 NOTE — H&P (Signed)
REFERRING PHYSICIAN: Pearla Dubonnet, MD  PROVIDER: Wayne Both, MD  MRN: [id number] DOB: 08/21/1946  Subjective  Chief Complaint: New Consultation (Left Ing. Hernia )   History of Present Illness: Dwayne [name] is a 76 y.o. male who is seen  as an office consultation at the request of Dr. Kevan Ny for evaluation of New Consultation (Left Ing. Hernia ) .   This is a 76 year old gentleman is herefor evaluation of a symptomatic left inguinal hernia. The patient started noticing the hernia almost a year ago. He only has mild discomfort but is having to wear a truss to keep the hernia reduced. He is on chronic anticoagulation secondary to history of DVTs and PEs. He has no cardiac history. He walks almost 5 miles every day. He reports he will occasionally have PVCs. He has no pulmonary history as well. He is otherwise without complaints.  Review of Systems: A complete review of systems was obtained from the patient. I have reviewed this information and discussed as appropriate with the patient. See HPI as well for other ROS.  ROS   Medical History: Past Medical History:  Diagnosis Date   Arrhythmia   Arthritis   DVT (deep venous thrombosis) (CMS-HCC)   GERD (gastroesophageal reflux disease)   Hyperlipidemia   Hypertension   Patient Active Problem List  Diagnosis   Allergic rhinitis   Arrhythmia   Bronchospasm   Cough   Deep venous thrombosis (CMS-HCC)   DVT of lower limb, acute (CMS-HCC)   Essential hypertension   Hearing loss   Left knee pain   Other long term (current) drug therapy   Other pulmonary embolism without acute cor pulmonale (CMS-HCC)   Personal history of pulmonary embolism   Prediabetes   Pure hypercholesterolemia   Raised antibody titer   Retroperitoneal bleed   Encounter for general adult medical examination without abnormal findings   Encounter for general adult medical examination with abnormal findings   Screening for malignant neoplasm  of prostate   Tinea unguium   Vitamin D deficiency   Past Surgical History:  Procedure Laterality Date   Knee Surgery 2017    Allergies  Allergen Reactions   Penicillin Unknown   Penicillins Swelling   Current Outpatient Medications on File Prior to Visit  Medication Sig Dispense Refill   rivaroxaban (XARELTO) 10 mg tablet Take 1 tablet (10 mg total) by mouth once daily   lisinopriL-hydrochlorothiazide (ZESTORETIC) 10-12.5 mg tablet Take 1 tablet by mouth once daily   No current facility-administered medications on file prior to visit.   History reviewed. No pertinent family history.   Social History   Tobacco Use  Smoking Status Never  Smokeless Tobacco Never    Social History   Socioeconomic History   Marital status: Married  Tobacco Use   Smoking status: Never   Smokeless tobacco: Never  Vaping Use   Vaping Use: Never used  Substance and Sexual Activity   Alcohol use: Yes   Drug use: Never   Objective:   Vitals:   BP: 120/70  Pulse: 85  Temp: 36.9 C (98.5 F)  SpO2: 99%  Weight: 75.7 kg (166 lb 12.8 oz)  Height: 177.8 cm (5\' 10" )   Body mass index is 23.93 kg/m.  Physical Exam   He appears well on exam  His abdomen is soft and nontender. There is a small, easily reducible left inguinal hernia without evidence of right inguinal hernia or umbilical hernia  Lungs clear CV RRR  Labs, Imaging and  Diagnostic Testing: I reviewed his notes in the electronic medical records.  Assessment and Plan:  Diagnoses and all orders for this visit:  Left inguinal hernia    This is a patient with a left inguinal hernia. I have reviewed his notes from his primary care provider. I discussed hernias with the patient in detail. We discussed abdominal wall anatomy. We discussed the reasonings to repair hernias as well. We next discussed hernia repair with mesh as well as the laparoscopic and open techniques. Given his need for chronic anticoagulation, I would  recommend an open repair with mesh with limited anesthesia and a tap block. He would need to stop his anticoagulation 2 days preoperatively and I would resume it the evening of surgery. I discussed the risk of surgery which includes but is not limited to bleeding, infection, the use of mesh, injury to surrounding structures, nerve entrapment, chronic pain, DVT, postoperative recovery, etc. He is interested with discussing these with anesthesia preoperatively given the occasional PVCs that he has had for years.

## 2022-01-22 ENCOUNTER — Other Ambulatory Visit: Payer: Self-pay

## 2022-01-22 ENCOUNTER — Ambulatory Visit (HOSPITAL_BASED_OUTPATIENT_CLINIC_OR_DEPARTMENT_OTHER): Payer: 59 | Admitting: Anesthesiology

## 2022-01-22 ENCOUNTER — Encounter (HOSPITAL_BASED_OUTPATIENT_CLINIC_OR_DEPARTMENT_OTHER): Payer: Self-pay | Admitting: Surgery

## 2022-01-22 ENCOUNTER — Encounter (HOSPITAL_BASED_OUTPATIENT_CLINIC_OR_DEPARTMENT_OTHER)
Admission: RE | Disposition: A | Discharge status: Home / Self Care | Payer: Self-pay | Source: Ambulatory Visit | Attending: Surgery

## 2022-01-22 ENCOUNTER — Ambulatory Visit (HOSPITAL_COMMUNITY)
Admission: RE | Admit: 2022-01-22 | Discharge: 2022-01-22 | Disposition: A | Discharge status: Home / Self Care | Payer: 59 | Source: Ambulatory Visit | Attending: Surgery | Admitting: Surgery

## 2022-01-22 DIAGNOSIS — I10 Essential (primary) hypertension: Secondary | ICD-10-CM | POA: Diagnosis not present

## 2022-01-22 DIAGNOSIS — Z79899 Other long term (current) drug therapy: Secondary | ICD-10-CM | POA: Diagnosis not present

## 2022-01-22 DIAGNOSIS — M1990 Unspecified osteoarthritis, unspecified site: Secondary | ICD-10-CM

## 2022-01-22 DIAGNOSIS — Z86711 Personal history of pulmonary embolism: Secondary | ICD-10-CM | POA: Insufficient documentation

## 2022-01-22 DIAGNOSIS — Z7901 Long term (current) use of anticoagulants: Secondary | ICD-10-CM | POA: Insufficient documentation

## 2022-01-22 DIAGNOSIS — Z86718 Personal history of other venous thrombosis and embolism: Secondary | ICD-10-CM | POA: Diagnosis not present

## 2022-01-22 DIAGNOSIS — M4802 Spinal stenosis, cervical region: Secondary | ICD-10-CM

## 2022-01-22 DIAGNOSIS — K4090 Unilateral inguinal hernia, without obstruction or gangrene, not specified as recurrent: Secondary | ICD-10-CM | POA: Diagnosis not present

## 2022-01-22 HISTORY — PX: INGUINAL HERNIA REPAIR: SHX194

## 2022-01-22 SURGERY — HERNIA REPAIR INGUINAL ADULT
Anesthesia: Regional | Laterality: Left

## 2022-01-22 MED ORDER — OXYCODONE HCL 5 MG PO TABS: 5.0000 mg | ORAL_TABLET | Freq: Once | ORAL | Status: DC | PRN

## 2022-01-22 MED ORDER — ACETAMINOPHEN 500 MG PO TABS
1000.0000 mg | ORAL_TABLET | ORAL | Status: AC
  Administered 2022-01-22: 1000 mg via ORAL

## 2022-01-22 MED ORDER — EPHEDRINE 5 MG/ML INJ
INTRAVENOUS | Status: AC
  Filled 2022-01-22: qty 5

## 2022-01-22 MED ORDER — FENTANYL CITRATE (PF) 100 MCG/2ML IJ SOLN
50.0000 ug | Freq: Once | INTRAMUSCULAR | Status: AC
  Administered 2022-01-22: 50 ug via INTRAVENOUS

## 2022-01-22 MED ORDER — FENTANYL CITRATE (PF) 100 MCG/2ML IJ SOLN
INTRAMUSCULAR | Status: AC
  Filled 2022-01-22: qty 2

## 2022-01-22 MED ORDER — PROPOFOL 10 MG/ML IV BOLUS
INTRAVENOUS | Status: DC | PRN
  Administered 2022-01-22: 150 mg via INTRAVENOUS

## 2022-01-22 MED ORDER — AMISULPRIDE (ANTIEMETIC) 5 MG/2ML IV SOLN: 10.0000 mg | Freq: Once | INTRAVENOUS | Status: DC | PRN

## 2022-01-22 MED ORDER — BUPIVACAINE-EPINEPHRINE (PF) 0.5% -1:200000 IJ SOLN
INTRAMUSCULAR | Status: AC
  Filled 2022-01-22: qty 30

## 2022-01-22 MED ORDER — ONDANSETRON HCL 4 MG/2ML IJ SOLN
INTRAMUSCULAR | Status: DC | PRN
  Administered 2022-01-22: 4 mg via INTRAVENOUS

## 2022-01-22 MED ORDER — ACETAMINOPHEN 500 MG PO TABS
ORAL_TABLET | ORAL | Status: AC
  Filled 2022-01-22: qty 2

## 2022-01-22 MED ORDER — CIPROFLOXACIN IN D5W 400 MG/200ML IV SOLN
400.0000 mg | INTRAVENOUS | Status: AC
  Administered 2022-01-22: 400 mg via INTRAVENOUS

## 2022-01-22 MED ORDER — OXYCODONE HCL 5 MG/5ML PO SOLN: 5.0000 mg | Freq: Once | ORAL | Status: DC | PRN

## 2022-01-22 MED ORDER — LIDOCAINE 2% (20 MG/ML) 5 ML SYRINGE
INTRAMUSCULAR | Status: DC | PRN
Start: 2022-01-22 — End: 2022-01-22
  Administered 2022-01-22: 80 mg via INTRAVENOUS

## 2022-01-22 MED ORDER — FENTANYL CITRATE (PF) 100 MCG/2ML IJ SOLN: 25.0000 ug | INTRAMUSCULAR | Status: DC | PRN

## 2022-01-22 MED ORDER — DEXAMETHASONE SODIUM PHOSPHATE 10 MG/ML IJ SOLN
INTRAMUSCULAR | Status: AC
Start: 2022-01-22 — End: ?
  Filled 2022-01-22: qty 1

## 2022-01-22 MED ORDER — EPHEDRINE SULFATE (PRESSORS) 50 MG/ML IJ SOLN
INTRAMUSCULAR | Status: DC | PRN
  Administered 2022-01-22: 10 mg via INTRAVENOUS
  Administered 2022-01-22 (×2): 5 mg via INTRAVENOUS

## 2022-01-22 MED ORDER — BUPIVACAINE HCL (PF) 0.5 % IJ SOLN
INTRAMUSCULAR | Status: DC | PRN
  Administered 2022-01-22: 20 mL

## 2022-01-22 MED ORDER — BUPIVACAINE LIPOSOME 1.3 % IJ SUSP
INTRAMUSCULAR | Status: DC | PRN
  Administered 2022-01-22: 10 mL

## 2022-01-22 MED ORDER — LACTATED RINGERS IV SOLN: INTRAVENOUS | Status: DC

## 2022-01-22 MED ORDER — PROPOFOL 10 MG/ML IV BOLUS
INTRAVENOUS | Status: AC
  Filled 2022-01-22: qty 20

## 2022-01-22 MED ORDER — DEXAMETHASONE SODIUM PHOSPHATE 4 MG/ML IJ SOLN
INTRAMUSCULAR | Status: DC | PRN
  Administered 2022-01-22: 10 mg via INTRAVENOUS

## 2022-01-22 MED ORDER — CIPROFLOXACIN IN D5W 400 MG/200ML IV SOLN
INTRAVENOUS | Status: AC
  Filled 2022-01-22: qty 200

## 2022-01-22 MED ORDER — SODIUM BICARBONATE 4.2 % IV SOLN
INTRAVENOUS | Status: AC
  Filled 2022-01-22: qty 10

## 2022-01-22 MED ORDER — ONDANSETRON HCL 4 MG/2ML IJ SOLN: 4.0000 mg | Freq: Once | INTRAMUSCULAR | Status: DC | PRN

## 2022-01-22 MED ORDER — LIDOCAINE HCL (PF) 1 % IJ SOLN
INTRAMUSCULAR | Status: AC
  Filled 2022-01-22: qty 30

## 2022-01-22 MED ORDER — MIDAZOLAM HCL 2 MG/2ML IJ SOLN
INTRAMUSCULAR | Status: AC
  Filled 2022-01-22: qty 2

## 2022-01-22 MED ORDER — LIDOCAINE 2% (20 MG/ML) 5 ML SYRINGE
INTRAMUSCULAR | Status: AC
  Filled 2022-01-22: qty 5

## 2022-01-22 MED ORDER — ACETAMINOPHEN 500 MG PO TABS: 1000.0000 mg | ORAL_TABLET | Freq: Once | ORAL | Status: AC

## 2022-01-22 MED ORDER — BUPIVACAINE-EPINEPHRINE 0.5% -1:200000 IJ SOLN
INTRAMUSCULAR | Status: DC | PRN
  Administered 2022-01-22: 10 mL

## 2022-01-22 MED ORDER — TRAMADOL HCL 50 MG PO TABS: 50.0000 mg | ORAL_TABLET | Freq: Four times a day (QID) | ORAL | 0 refills | Status: DC | PRN

## 2022-01-22 MED ORDER — ONDANSETRON HCL 4 MG/2ML IJ SOLN
INTRAMUSCULAR | Status: AC
  Filled 2022-01-22: qty 2

## 2022-01-22 SURGICAL SUPPLY — 41 items
ADH SKN CLS APL DERMABOND .7 (GAUZE/BANDAGES/DRESSINGS) ×1
APL PRP STRL LF DISP 70% ISPRP (MISCELLANEOUS) ×1
BLADE CLIPPER SURG (BLADE) ×2
BLADE SURG 15 STRL LF DISP TIS (BLADE) ×1
BLADE SURG 15 STRL SS (BLADE) ×2
CANISTER SUCT 1200ML W/VALVE (MISCELLANEOUS)
CHLORAPREP W/TINT 26 (MISCELLANEOUS) ×2
COVER BACK TABLE 60X90IN (DRAPES) ×2
COVER MAYO STAND STRL (DRAPES) ×2
DERMABOND ADVANCED (GAUZE/BANDAGES/DRESSINGS) ×1
DERMABOND ADVANCED .7 DNX12 (GAUZE/BANDAGES/DRESSINGS) ×1
DRAIN PENROSE .5X12 LATEX STL (DRAIN) ×2
DRAPE LAPAROTOMY 100X72 PEDS (DRAPES) ×2
DRAPE UTILITY XL STRL (DRAPES) ×2
ELECT REM PT RETURN 9FT ADLT (ELECTROSURGICAL) ×2
GLOVE SURG SIGNA 7.5 PF LTX (GLOVE) ×2
GOWN STRL REUS W/ TWL LRG LVL3 (GOWN DISPOSABLE) ×1
GOWN STRL REUS W/ TWL XL LVL3 (GOWN DISPOSABLE) ×1
GOWN STRL REUS W/TWL LRG LVL3 (GOWN DISPOSABLE) ×2
GOWN STRL REUS W/TWL XL LVL3 (GOWN DISPOSABLE) ×2
MESH PARIETEX PROGRIP LEFT (Mesh General) ×2 IMPLANT
NDL HYPO 25X1 1.5 SAFETY (NEEDLE) ×1 IMPLANT
NEEDLE HYPO 25X1 1.5 SAFETY (NEEDLE) ×2
NS IRRIG 1000ML POUR BTL (IV SOLUTION)
PACK BASIN DAY SURGERY FS (CUSTOM PROCEDURE TRAY) ×2
PENCIL SMOKE EVACUATOR (MISCELLANEOUS) ×2
SLEEVE SCD COMPRESS KNEE MED (STOCKING) ×2
SPIKE FLUID TRANSFER (MISCELLANEOUS)
SPONGE INTESTINAL PEANUT (DISPOSABLE)
SPONGE T-LAP 4X18 ~~LOC~~+RFID (SPONGE) ×2
SUT MNCRL AB 4-0 PS2 18 (SUTURE) ×2
SUT SILK 2 0 SH (SUTURE) ×2
SUT VIC AB 2-0 CT1 27 (SUTURE) ×8
SUT VIC AB 2-0 CT1 TAPERPNT 27 (SUTURE) ×4
SUT VIC AB 3-0 CT1 27 (SUTURE) ×2
SUT VIC AB 3-0 CT1 27XBRD (SUTURE) ×1
SYR BULB EAR ULCER 3OZ GRN STR (SYRINGE)
SYR CONTROL 10ML LL (SYRINGE) ×2
TOWEL GREEN STERILE FF (TOWEL DISPOSABLE) ×2
TUBE CONNECTING 20X1/4 (TUBING)
YANKAUER SUCT BULB TIP NO VENT (SUCTIONS)

## 2022-01-22 NOTE — Transfer of Care (Signed)
Immediate Anesthesia Transfer of Care Note  Patient: Dwayne [name]  Procedure(s) Performed: OPEN LEFT INGUINAL HERNIA REPAIR WITH MESH (Left)  Patient Location: PACU  Anesthesia Type:General and Regional  Level of Consciousness: drowsy  Airway & Oxygen Therapy: Patient Spontanous Breathing and Patient connected to face mask oxygen  Post-op Assessment: Report given to RN and Post -op Vital signs reviewed and stable  Post vital signs: Reviewed and stable  Last Vitals:  Vitals Value Taken Time  BP    Temp    Pulse 64 01/22/22 0822  Resp 16 01/22/22 0822  SpO2 97 % 01/22/22 0822  Vitals shown include unvalidated device data.  Last Pain:  Vitals:   01/22/22 0634  TempSrc: Oral  PainSc: 0-No pain         Complications: No notable events documented.

## 2022-01-22 NOTE — Op Note (Signed)
OPEN LEFT INGUINAL HERNIA REPAIR WITH MESH  Procedure Note  SEGER GOUKER 01/22/2022   Pre-op Diagnosis: LEFT INGUINAL HERNIA     Post-op Diagnosis: same  Procedure(s): OPEN LEFT INGUINAL HERNIA REPAIR WITH MESH  Surgeon(s): Coralie Keens, MD Carlena Hurl, PA-C  Anesthesia: General  Staff:  Circulator: Donnita Falls, RN Scrub Person: Lorenza Burton, CST  Estimated Blood Loss: Minimal               Findings: The patient was found to have a large indirect as well as large direct left inguinal hernia.  This was repaired with a piece of large Prolene ProGrip mesh from [employer]  Procedure: The patient had been identified in the preoperative holding area and a left tap block was performed by anesthesiology.  He was then taken to the operating room.  He is placed upon the operating table general anesthesia was induced.  His abdomen was then prepped and draped in usual sterile fashion.  I anesthetized the skin in the left lower quadrant inguinal area with Marcaine and then made a longitudinal incision with a scalpel.  I then dissected down through the subcutaneous tissue and Scarpa's fascia and identified the external bleak fascia which I opened toward the internal and external rings.  The patient was found to have a large direct hernia defect with most of his floor of his inguinal canal disrupted.  He also had a large indirect hernia sac.  I controlled the testicular cord and structures with a Penrose drain and then was able to slowly reduce the sac from the cord structures.  I opened the sac which was nearly communicating with the direct hernia sac and freed up sigmoid colon that was involved in the sac and reduced back into the abdominal cavity.  I then closed the indirect sac with a 2-0 silk suture.  I likewise closed the direct sac with a silk suture as well.  I then reapproximated the inguinal floor with interrupted 2-0 Vicryl and 2-0 silk sutures.  Next a piece of large Prolene ProGrip  mesh was brought to the field.  I placed it against the pubic tubercle and brought around the cord structures.  I then sutured in place with several 2-0 Vicryl sutures.  Wide coverage of the inguinal floor and internal ring appeared to be achieved.  Hemostasis appeared to be achieved.  I then closed the external bleak fascia over the top of the mesh with a running 2-0 Vicryl suture.  Scarpa's fascia was then closed interrupted 3-0 Vicryl sutures and the skin was closed with running 4-0 Monocryl.  Dermabond was then applied.  The patient tolerated the procedure well.  All the counts were correct at the end of the procedure.  The patient was then extubated in the operating room and taken in stable condition to the recovery room.          Coralie Keens   Date: 01/22/2022  Time: 8:10 AM

## 2022-01-22 NOTE — Discharge Instructions (Addendum)
[facility] _______[facility]  UMBILICAL OR INGUINAL HERNIA REPAIR: POST OP INSTRUCTIONS  Always review your discharge instruction sheet given to you by the facility where your surgery was performed. IF YOU HAVE DISABILITY OR FAMILY LEAVE FORMS, YOU MUST BRING THEM TO THE OFFICE FOR PROCESSING.   DO NOT GIVE THEM TO YOUR DOCTOR.  1. A  prescription for pain medication may be given to you upon discharge.  Take your pain medication as prescribed, if needed.  If narcotic pain medicine is not needed, then you may take acetaminophen (Tylenol) or ibuprofen (Advil) as needed. 2. Take your usually prescribed medications unless otherwise directed. If you need a refill on your pain medication, please contact your pharmacy.  They will contact our office to request authorization. Prescriptions will not be filled after 5 pm or on week-ends. 3. You should follow a light diet the first 24 hours after arrival home, such as soup and crackers, etc.  Be sure to include lots of fluids daily.  Resume your normal diet the day after surgery. 4.Most patients will experience some swelling and bruising around the umbilicus or in the groin and scrotum.  Ice packs and reclining will help.  Swelling and bruising can take several days to resolve.  6. It is common to experience some constipation if taking pain medication after surgery.  Increasing fluid intake and taking a stool softener (such as Colace) will usually help or prevent this problem from occurring.  A mild laxative (Milk of Magnesia or Miralax) should be taken according to package directions if there are no bowel movements after 48 hours. 7. Unless discharge instructions indicate otherwise, you may remove your bandages 24-48 hours after surgery, and you may shower at that time.  You may have steri-strips (small skin tapes) in place directly over the incision.  These strips should be left on the skin for 7-10 days.  If your surgeon used skin glue on the  incision, you may shower in 24 hours.  The glue will flake off over the next 2-3 weeks.  Any sutures or staples will be removed at the office during your follow-up visit. 8. ACTIVITIES:  You may resume regular (light) daily activities beginning the next day--such as daily self-care, walking, climbing stairs--gradually increasing activities as tolerated.  You may have sexual intercourse when it is comfortable.  Refrain from any heavy lifting or straining until approved by your doctor.  a.You may drive when you are no longer taking prescription pain medication, you can comfortably wear a seatbelt, and you can safely maneuver your car and apply brakes. b.RETURN TO WORK:   _____________________________________________  9.You should see your doctor in the office for a follow-up appointment approximately 2-3 weeks after your surgery.  Make sure that you call for this appointment within a day or two after you arrive home to insure a convenient appointment time. 10.OTHER INSTRUCTIONS: __OK TO SHOWER STARTING TOMORROW ICE PACK AND TYLENOL ALSO FOR PAIN NO LIFTING MORE THAN 15 POUNDS FOR 4 WEEKS RESUME XARELTO STARTING TOMORROW_______________________    _____________________________________  WHEN TO CALL YOUR DOCTOR: Fever over 101.0 Inability to urinate Nausea and/or vomiting Extreme swelling or bruising Continued bleeding from incision. Increased pain, redness, or drainage from the incision  The clinic staff is available to answer your questions during regular business hours.  Please dont hesitate to call and ask to speak to one of the nurses for clinical concerns.  If you have a medical emergency, go to the nearest emergency room or  call 911.  A surgeon from [facility] is always on call at the hospital   [address], [place], [place]  [address] ?  [address] [address], [place], [place]   [address] [phone] ? [phone] ? FAX [phone] Web site:  [email or web address]   No Tylenol until after 12:30pm today if needed  Post Anesthesia Home Care Instructions  Activity: Get plenty of rest for the remainder of the day. A responsible individual must stay with you for 24 hours following the procedure.  For the next 24 hours, DO NOT: -Drive a car -Advertising copywriter -Drink alcoholic beverages -Take any medication unless instructed by your physician -Make any legal decisions or sign important papers.  Meals: Start with liquid foods such as gelatin or soup. Progress to regular foods as tolerated. Avoid greasy, spicy, heavy foods. If nausea and/or vomiting occur, drink only clear liquids until the nausea and/or vomiting subsides. Call your physician if vomiting continues.  Special Instructions/Symptoms: Your throat may feel dry or sore from the anesthesia or the breathing tube placed in your throat during surgery. If this causes discomfort, gargle with warm salt water. The discomfort should disappear within 24 hours.  If you had a scopolamine patch placed behind your ear for the management of post- operative nausea and/or vomiting:  1. The medication in the patch is effective for 72 hours, after which it should be removed.  Wrap patch in a tissue and discard in the trash. Wash hands thoroughly with soap and water. 2. You may remove the patch earlier than 72 hours if you experience unpleasant side effects which may include dry mouth, dizziness or visual disturbances. 3. Avoid touching the patch. Wash your hands with soap and water after contact with the patch.     Information for Discharge Teaching: EXPAREL (bupivacaine liposome injectable suspension)   Your surgeon or anesthesiologist gave you EXPAREL(bupivacaine) to help control your pain after surgery.  EXPAREL is a local anesthetic that provides pain relief by numbing the tissue around the surgical site. EXPAREL is designed to release pain medication over time and can control  pain for up to 72 hours. Depending on how you respond to EXPAREL, you may require less pain medication during your recovery.  Possible side effects: Temporary loss of sensation or ability to move in the area where bupivacaine was injected. Nausea, vomiting, constipation Rarely, numbness and tingling in your mouth or lips, lightheadedness, or anxiety may occur. Call your doctor right away if you think you may be experiencing any of these sensations, or if you have other questions regarding possible side effects.  Follow all other discharge instructions given to you by your surgeon or nurse. Eat a healthy diet and drink plenty of water or other fluids.  If you return to the hospital for any reason within 96 hours following the administration of EXPAREL, it is important for health care providers to know that you have received this anesthetic. A teal colored band has been placed on your arm with the date, time and amount of EXPAREL you have received in order to alert and inform your health care providers. Please leave this armband in place for the full 96 hours following administration, and then you may remove the band.

## 2022-01-22 NOTE — Progress Notes (Signed)
Assisted Dr. Bass with left, ultrasound guided, transabdominal plane block. Side rails up, monitors on throughout procedure. See vital signs in flow sheet. Tolerated Procedure well. 

## 2022-01-22 NOTE — Anesthesia Postprocedure Evaluation (Signed)
Anesthesia Post Note  Patient: Dwayne [name]  Procedure(s) Performed: OPEN LEFT INGUINAL HERNIA REPAIR WITH MESH (Left)     Patient location during evaluation: PACU Anesthesia Type: Regional and General Level of consciousness: awake Pain management: pain level controlled Vital Signs Assessment: post-procedure vital signs reviewed and stable Respiratory status: spontaneous breathing and respiratory function stable Cardiovascular status: stable Postop Assessment: no apparent nausea or vomiting Anesthetic complications: no   No notable events documented.  Last Vitals:  Vitals:   01/22/22 0845 01/22/22 0857  BP: 134/75 132/65  Pulse: 66 69  Resp: 17 16  Temp:  36.7 C  SpO2: 93% 94%    Last Pain:  Vitals:   01/22/22 0857  TempSrc:   PainSc: 0-No pain                 Mellody Dance

## 2022-01-22 NOTE — Anesthesia Procedure Notes (Signed)
Procedure Name: LMA Insertion Date/Time: 01/22/2022 7:29 AM Performed by: Alford Highland, CRNA Pre-anesthesia Checklist: Patient identified, Emergency Drugs available, Suction available and Patient being monitored Patient Re-evaluated:Patient Re-evaluated prior to induction Oxygen Delivery Method: Circle System Utilized Preoxygenation: Pre-oxygenation with 100% oxygen Induction Type: IV induction Ventilation: Mask ventilation without difficulty LMA: LMA inserted LMA Size: 4.0 Number of attempts: 1 Airway Equipment and Method: Bite block Placement Confirmation: positive ETCO2 Tube secured with: Tape Dental Injury: Teeth and Oropharynx as per pre-operative assessment

## 2022-01-22 NOTE — Interval H&P Note (Signed)
History and Physical Interval Note:no change in H and P  01/22/2022 7:09 AM  Dwayne [name]  has presented today for surgery, with the diagnosis of LEFT INGUINAL HERNIA.  The various methods of treatment have been discussed with the patient and family. After consideration of risks, benefits and other options for treatment, the patient has consented to  Procedure(s): OPEN LEFT INGUINAL HERNIA REPAIR WITH MESH (Left) as a surgical intervention.  The patient's history has been reviewed, patient examined, no change in status, stable for surgery.  I have reviewed the patient's chart and labs.  Questions were answered to the patient's satisfaction.     Coralie Keens

## 2022-01-22 NOTE — Anesthesia Procedure Notes (Signed)
Anesthesia Regional Block: TAP block   Pre-Anesthetic Checklist: , timeout performed,  Correct Patient, Correct Site, Correct Laterality,  Correct Procedure, Correct Position, site marked,  Risks and benefits discussed,  Surgical consent,  Pre-op evaluation,  At surgeon's request and post-op pain management  Laterality: Left  Prep: chloraprep       Needles:  Injection technique: Single-shot  Needle Type: Echogenic Stimulator Needle     Needle Length: 10cm  Needle Gauge: 20     Additional Needles:   Procedures:,,,, ultrasound used (permanent image in chart),,    Narrative:  Start time: 01/22/2022 6:50 AM End time: 01/22/2022 6:55 AM Injection made incrementally with aspirations every 5 mL.  Performed by: Personally  Anesthesiologist: Merlinda Frederick, MD  Additional Notes: Functioning IV was confirmed and monitors were applied.  Sterile prep and drape,hand hygiene and sterile gloves were used. Ultrasound guidance: relevant anatomy identified, needle position confirmed, local anesthetic spread visualized around nerve(s)., vascular puncture avoided. Negative aspiration and negative test dose prior to incremental administration of local anesthetic. The patient tolerated the procedure well.

## 2022-01-23 ENCOUNTER — Encounter (HOSPITAL_BASED_OUTPATIENT_CLINIC_OR_DEPARTMENT_OTHER): Payer: Self-pay | Admitting: Surgery

## 2022-01-23 NOTE — Progress Notes (Signed)
Postop pain relief
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
## 2022-10-03 ENCOUNTER — Inpatient Hospital Stay: Payer: Medicare Other | Attending: Oncology | Admitting: Oncology

## 2022-10-03 ENCOUNTER — Encounter: Payer: Self-pay | Admitting: Oncology

## 2022-10-03 VITALS — BP 135/60 | HR 62 | Temp 98.2°F | Resp 20 | Ht 70.0 in | Wt 162.0 lb

## 2022-10-03 DIAGNOSIS — I10 Essential (primary) hypertension: Secondary | ICD-10-CM | POA: Insufficient documentation

## 2022-10-03 DIAGNOSIS — Z86711 Personal history of pulmonary embolism: Secondary | ICD-10-CM | POA: Diagnosis not present

## 2022-10-03 DIAGNOSIS — Z86718 Personal history of other venous thrombosis and embolism: Secondary | ICD-10-CM | POA: Diagnosis present

## 2022-10-03 DIAGNOSIS — Z7901 Long term (current) use of anticoagulants: Secondary | ICD-10-CM | POA: Diagnosis not present

## 2022-10-03 DIAGNOSIS — I2699 Other pulmonary embolism without acute cor pulmonale: Secondary | ICD-10-CM | POA: Diagnosis not present

## 2022-10-03 NOTE — Progress Notes (Signed)
  [facility] OFFICE PROGRESS NOTE   Diagnosis: Venous thromboembolism  INTERVAL HISTORY:   Dwayne [name] is as scheduled.  He continues Xarelto anticoagulation.  No bleeding or symptom of recurrent thrombosis.  He underwent repair of a left inguinal hernia on 01/22/2022.  He held Xarelto for the surgery.  Objective:  Vital signs in last 24 hours:  Blood pressure 135/60, pulse 62, temperature 98.2 F (36.8 C), temperature source Oral, resp. rate 20, height 5\' 10"  (1.778 m), weight 162 lb (73.5 kg), SpO2 98 %.    Resp: Lungs clear bilaterally Cardio: Regular rhythm with premature beats GI: No hepatosplenomegaly, nontender, no mass Vascular: The left lower leg is slightly larger than the right side, no edema    Portacath/PICC-without erythema  Lab Results:  Lab Results  Component Value Date   WBC 6.1 03/04/2018   HGB 14.9 03/04/2018   HCT 44.8 03/04/2018   MCV 87 03/04/2018   PLT 197 03/04/2018   NEUTROABS 3.1 03/04/2018    CMP  Lab Results  Component Value Date   NA 137 01/17/2022   K 4.2 01/17/2022   CL 100 01/17/2022   CO2 29 01/17/2022   GLUCOSE 90 01/17/2022   BUN 23 01/17/2022   CREATININE 0.96 01/17/2022   CALCIUM 9.1 01/17/2022   GFRNONAA >60 01/17/2022   GFRAA 83 03/04/2018   Medications: I have reviewed the patient's current medications.   Assessment/Plan:  Venous thromboembolism Left lower extremity DVT following left knee surgery August 2016, despite 10 days of postoperative aspirin prophylaxis 3 months of Xarelto anticoagulation Acute left lower extremity DVT (left posterior tibial, peroneal, popliteal, left femoral, and distal left common femoral veins) and bilateral pulmonary embolism April 2017 Xarelto anticoagulation resumed, negative hypercoagulation evaluation Rectus sheath hematoma 12/25/2016 anticoagulation held Anticoagulation resumed with Xarelto at a 10 mg dose Hypertension Left inguinal hernia-open surgical repair  01/22/2022   Disposition: Dwayne [name] appears stable.  He will continue indefinite Xarelto anticoagulation.  He would like to continue follow-up in the hematology clinic.  He will return for an office visit in 1 year.  He will call in the interim needed.  Cleopatra Cedar, MD  10/03/2022  12:14 PM

## 2022-11-22 LAB — COLOGUARD: COLOGUARD: POSITIVE — AB

## 2023-01-17 ENCOUNTER — Telehealth: Payer: Self-pay

## 2023-01-17 NOTE — Telephone Encounter (Signed)
Faxed [phone] over the medicine clearance letter to [facility] at [facility].

## 2023-09-10 NOTE — Telephone Encounter (Signed)
TC

## 2023-10-04 ENCOUNTER — Encounter: Payer: Self-pay | Admitting: Oncology

## 2023-10-04 ENCOUNTER — Other Ambulatory Visit: Payer: Self-pay | Admitting: *Deleted

## 2023-10-04 ENCOUNTER — Inpatient Hospital Stay: Payer: Medicare Other | Attending: Oncology | Admitting: Oncology

## 2023-10-04 VITALS — BP 139/73 | HR 80 | Temp 98.6°F | Resp 20 | Ht 70.0 in | Wt 150.3 lb

## 2023-10-04 DIAGNOSIS — Z86711 Personal history of pulmonary embolism: Secondary | ICD-10-CM | POA: Diagnosis not present

## 2023-10-04 DIAGNOSIS — I2699 Other pulmonary embolism without acute cor pulmonale: Secondary | ICD-10-CM | POA: Diagnosis not present

## 2023-10-04 DIAGNOSIS — Z7901 Long term (current) use of anticoagulants: Secondary | ICD-10-CM | POA: Diagnosis not present

## 2023-10-04 DIAGNOSIS — Z8616 Personal history of COVID-19: Secondary | ICD-10-CM | POA: Diagnosis not present

## 2023-10-04 DIAGNOSIS — I10 Essential (primary) hypertension: Secondary | ICD-10-CM | POA: Diagnosis not present

## 2023-10-04 DIAGNOSIS — Z8719 Personal history of other diseases of the digestive system: Secondary | ICD-10-CM | POA: Diagnosis not present

## 2023-10-04 DIAGNOSIS — Z86718 Personal history of other venous thrombosis and embolism: Secondary | ICD-10-CM | POA: Diagnosis present

## 2023-10-04 MED ORDER — RIVAROXABAN 10 MG PO TABS: 10.0000 mg | ORAL_TABLET | Freq: Every day | ORAL | 3 refills | Status: DC

## 2023-10-04 NOTE — Progress Notes (Signed)
  [facility] OFFICE PROGRESS NOTE   Diagnosis: Chronic anticoagulation  INTERVAL HISTORY:   Mr. Imig returns as scheduled.  He continue Xarelto anticoagulation.  He has chronic intermittent nosebleeds.  The pattern is unchanged.  No other bleeding.  No symptom of recurrent thrombosis.  He and his wife had COVID-19 last month.  He had a sore throat, cough, and fever.  The symptoms have resolved.  Objective:  Vital signs in last 24 hours:  Blood pressure 139/73, pulse 80, temperature 98.6 F (37 C), temperature source Temporal, resp. rate 20, height 5\' 10"  (1.778 m), weight 150 lb 4.8 oz (68.2 kg), SpO2 98%.   Resp: Lungs clear bilaterally Cardio: Regular rate and rhythm GI: No hepatosplenomegaly Vascular: The left lower leg is slightly larger than the right side, no edema or erythema   Lab Results:  Lab Results  Component Value Date   WBC 6.1 03/04/2018   HGB 14.9 03/04/2018   HCT 44.8 03/04/2018   MCV 87 03/04/2018   PLT 197 03/04/2018   NEUTROABS 3.1 03/04/2018    CMP  Lab Results  Component Value Date   NA 137 01/17/2022   K 4.2 01/17/2022   CL 100 01/17/2022   CO2 29 01/17/2022   GLUCOSE 90 01/17/2022   BUN 23 01/17/2022   CREATININE 0.96 01/17/2022   CALCIUM 9.1 01/17/2022   GFRNONAA >60 01/17/2022   GFRAA 83 03/04/2018     Medications: I have reviewed the patient's current medications.   Assessment/Plan:  Venous thromboembolism Left lower extremity DVT following left knee surgery August 2016, despite 10 days of postoperative aspirin prophylaxis 3 months of Xarelto anticoagulation Acute left lower extremity DVT (left posterior tibial, peroneal, popliteal, left femoral, and distal left common femoral veins) and bilateral pulmonary embolism April 2017 Xarelto anticoagulation resumed, negative hypercoagulation evaluation Rectus sheath hematoma 12/25/2016 anticoagulation held Anticoagulation resumed with Xarelto at a 10 mg  dose Hypertension Left inguinal hernia-open surgical repair 01/22/2022    Disposition: Mr Chapple appears stable.  He is maintained on indefinite reduced intensity anticoagulation.  He appears to be tolerating the Xarelto well.  He is scheduled for cataract surgery and a root canal in the near future.  He will alert the treating providers that he is taking Xarelto.  He would like to continue follow-up in the hematology clinic.  Mr Vandorn will return for an office visit in 1 year.  Thornton Papas, MD  10/04/2023  12:14 PM

## 2023-12-20 ENCOUNTER — Other Ambulatory Visit: Payer: Self-pay | Admitting: Surgery

## 2024-01-07 NOTE — Anesthesia Preprocedure Evaluation (Addendum)
 Anesthesia Evaluation  Patient identified by MRN, date of birth, ID band Patient awake    Reviewed: Allergy & Precautions, NPO status , Patient's Chart, lab work & pertinent test results  Airway Mallampati: II  TM Distance: >3 FB Neck ROM: Full    Dental  (+) Dental Advisory Given   Pulmonary PE (2017, xarelto LD:)   breath sounds clear to auscultation       Cardiovascular hypertension, Pt. on medications + DVT   Rhythm:Regular Rate:Normal     Neuro/Psych negative neurological ROS  negative psych ROS   GI/Hepatic negative GI ROS, Neg liver ROS,,,  Endo/Other  negative endocrine ROS    Renal/GU negative Renal ROS  negative genitourinary   Musculoskeletal negative musculoskeletal ROS (+)    Abdominal   Peds  Hematology negative hematology ROS (+)   Anesthesia Other Findings   Reproductive/Obstetrics negative OB ROS                             Anesthesia Physical Anesthesia Plan  ASA: 2  Anesthesia Plan: General and Regional   Post-op Pain Management: Regional block* and Tylenol PO (pre-op)*   Induction: Intravenous  PONV Risk Score and Plan: 3 and Ondansetron, Dexamethasone, Midazolam and Treatment may vary due to age or medical condition  Airway Management Planned: LMA  Additional Equipment: None  Intra-op Plan:   Post-operative Plan: Extubation in OR  Informed Consent: I have reviewed the patients History and Physical, chart, labs and discussed the procedure including the risks, benefits and alternatives for the proposed anesthesia with the patient or authorized representative who has indicated his/her understanding and acceptance.     Dental advisory given  Plan Discussed with: CRNA  Anesthesia Plan Comments:         Anesthesia Quick Evaluation

## 2024-01-13 ENCOUNTER — Encounter (HOSPITAL_BASED_OUTPATIENT_CLINIC_OR_DEPARTMENT_OTHER): Payer: Self-pay | Admitting: Surgery

## 2024-01-13 ENCOUNTER — Other Ambulatory Visit: Payer: Self-pay

## 2024-01-13 NOTE — Progress Notes (Signed)
   01/13/24 1115  PAT Phone Screen  Is the patient taking a GLP-1 receptor agonist? No  Do You Have Diabetes? No  Do You Have Hypertension? (S)  Yes  Have You Ever Been to the ER for Asthma? No  Have You Taken Oral Steroids in the Past 3 Months? No  Do you Take Phenteramine or any Other Diet Drugs? No  Recent  Lab Work, EKG, CXR? No  Do you have a history of heart problems? (S)  Yes (arhythmia, no reported complications)  Have you ever had tests on your heart? Yes  What cardiac tests were performed? Chest X-ray  What date/year were cardiac tests completed? 2017  Results viewable: [facility] [employer] Tab  Any Recent Hospitalizations? No  Height 5\' 10"  (1.778 m)  Weight 72.6 kg  Pat Appointment Scheduled (S)  Yes (EKG, BMP)

## 2024-01-14 ENCOUNTER — Encounter (HOSPITAL_BASED_OUTPATIENT_CLINIC_OR_DEPARTMENT_OTHER)
Admission: RE | Admit: 2024-01-14 | Discharge: 2024-01-14 | Disposition: A | Discharge status: Home / Self Care | Payer: Medicare Other | Source: Ambulatory Visit | Attending: Surgery | Admitting: Surgery

## 2024-01-14 DIAGNOSIS — I10 Essential (primary) hypertension: Secondary | ICD-10-CM | POA: Insufficient documentation

## 2024-01-14 DIAGNOSIS — Z01818 Encounter for other preprocedural examination: Secondary | ICD-10-CM | POA: Diagnosis present

## 2024-01-14 DIAGNOSIS — I493 Ventricular premature depolarization: Secondary | ICD-10-CM | POA: Diagnosis not present

## 2024-01-14 LAB — BASIC METABOLIC PANEL
Anion gap: 6 (ref 5–15)
BUN: 20 mg/dL (ref 8–23)
CO2: 29 mmol/L (ref 22–32)
Calcium: 9.3 mg/dL (ref 8.9–10.3)
Chloride: 103 mmol/L (ref 98–111)
Creatinine, Ser: 1.27 mg/dL — ABNORMAL HIGH (ref 0.61–1.24)
GFR, Estimated: 59 mL/min — ABNORMAL LOW (ref >60)
Glucose, Bld: 82 mg/dL (ref 70–99)
Potassium: 4.5 mmol/L (ref 3.5–5.1)
Sodium: 138 mmol/L (ref 135–145)

## 2024-01-14 MED ORDER — ENSURE PRE-SURGERY PO LIQD
296.0000 mL | Freq: Once | ORAL | Status: DC
Start: 2024-01-15 — End: 2024-01-20

## 2024-01-14 MED ORDER — CHLORHEXIDINE GLUCONATE CLOTH 2 % EX PADS: 6.0000 | MEDICATED_PAD | Freq: Once | CUTANEOUS | Status: DC

## 2024-01-14 NOTE — Progress Notes (Signed)

## 2024-01-19 NOTE — H&P (Signed)
 REFERRING PHYSICIAN: Self PROVIDER: Wayne Both, MD MRN: [id number] DOB: 06-25-1946  Subjective   Chief Complaint: New Problem (R Inguinal Hernia)  History of Present Illness: Dwayne [name] is a 78 y.o. male who is seen today as an office consultation for evaluation of New Problem (R Inguinal Hernia)  This is a pleasant man who had repaired a left inguinal hernia on with mesh in 2023. 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 He now is developed a right inguinal hernia. He noticed it several months ago. He reports it causes mild discomfort but reduces easily and has had no obstructive symptoms. He remains on Xarelto.  Review of Systems: A complete review of systems was obtained from the patient. I have reviewed this information and discussed as appropriate with the patient. See HPI as well for other ROS.  ROS   Medical History: Past Medical History:  Diagnosis Date  Arrhythmia  Arthritis  DVT (deep venous thrombosis) (CMS/HHS-HCC)  GERD (gastroesophageal reflux disease)  Hyperlipidemia  Hypertension   Patient Active Problem List  Diagnosis  Allergic rhinitis  Arrhythmia  Bronchospasm  Cough  Deep venous thrombosis (CMS/HHS-HCC)  DVT of lower limb, acute (CMS/HHS-HCC)  Essential hypertension  Hearing loss  Left knee pain  Other long term (current) drug therapy  Other pulmonary embolism without acute cor pulmonale (CMS/HHS-HCC)  Personal history of pulmonary embolism  Prediabetes  Pure hypercholesterolemia  Raised antibody titer  Retroperitoneal bleed  Encounter for general adult medical examination without abnormal findings  Encounter for general adult medical examination with abnormal findings  Screening for malignant neoplasm of prostate  Tinea unguium  Vitamin D deficiency   Past Surgical History:  Procedure Laterality Date  Knee Surgery 2017  CATARACT EXTRACTION  HERNIA REPAIR    Allergies  Allergen Reactions  Penicillins  Swelling  Thimerosal Unknown   Current Outpatient Medications on File Prior to Visit  Medication Sig Dispense Refill  lisinopriL-hydrochlorothiazide (ZESTORETIC) 10-12.5 mg tablet Take 1 tablet by mouth once daily  MULTIVITAMIN ORAL Take 1 capsule by mouth once daily  rivaroxaban (XARELTO) 10 mg tablet Take 1 tablet (10 mg total) by mouth once daily   No current facility-administered medications on file prior to visit.   History reviewed. No pertinent family history.   Social History   Tobacco Use  Smoking Status Never  Smokeless Tobacco Never    Social History   Socioeconomic History  Marital status: Married  Tobacco Use  Smoking status: Never  Smokeless tobacco: Never  Vaping Use  Vaping status: Never Used  Substance and Sexual Activity  Alcohol use: Yes  Drug use: Never   Social Drivers of Health   Housing Stability: Unknown (12/20/2023)  Housing Stability Vital Sign  Homeless in the Last Year: No   Objective:   Vitals:   PainSc: 0-No pain   There is no height or weight on file to calculate BMI.  Physical Exam   He appears well on exam  His abdomen is soft and nontender. He has a small, easily reducible right inguinal hernia. There is no evidence of recurrent left inguinal hernia  Labs, Imaging and Diagnostic Testing: I reviewed the notes in his electronic medical records  Assessment and Plan:   Diagnoses and all orders for this visit:  Right inguinal hernia   We again discussed abdominal anatomy and the reasons to fix hernias. He does have symptoms from this hernia and is on chronic anticoagulation and is still quite active with lifting so I recommend  an open repair with mesh. We again discussed that it would be similar to his previous surgery with hopefully a tap block by anesthesiology. He would need to stop his Xarelto 2 days preoperatively. I discussed the risks of surgery which includes but is not limited to bleeding, infection, injury to  surrounding structures, use of mesh, nerve entrapment, chronic pain, hernia recurrence, DVT, etc. Again, he agrees to proceed with surgery which will be scheduled as soon as possible.

## 2024-01-20 ENCOUNTER — Encounter (HOSPITAL_BASED_OUTPATIENT_CLINIC_OR_DEPARTMENT_OTHER)
Admission: RE | Disposition: A | Discharge status: Home / Self Care | Payer: Self-pay | Source: Home / Self Care | Attending: Surgery

## 2024-01-20 ENCOUNTER — Ambulatory Visit (HOSPITAL_BASED_OUTPATIENT_CLINIC_OR_DEPARTMENT_OTHER): Payer: Medicare Other | Admitting: Anesthesiology

## 2024-01-20 ENCOUNTER — Encounter (HOSPITAL_BASED_OUTPATIENT_CLINIC_OR_DEPARTMENT_OTHER): Payer: Self-pay | Admitting: Surgery

## 2024-01-20 ENCOUNTER — Ambulatory Visit (HOSPITAL_BASED_OUTPATIENT_CLINIC_OR_DEPARTMENT_OTHER)
Admission: RE | Admit: 2024-01-20 | Discharge: 2024-01-20 | Disposition: A | Discharge status: Home / Self Care | Payer: Medicare Other | Attending: Surgery | Admitting: Surgery

## 2024-01-20 ENCOUNTER — Other Ambulatory Visit: Payer: Self-pay

## 2024-01-20 DIAGNOSIS — I10 Essential (primary) hypertension: Secondary | ICD-10-CM

## 2024-01-20 DIAGNOSIS — Z7901 Long term (current) use of anticoagulants: Secondary | ICD-10-CM | POA: Diagnosis not present

## 2024-01-20 DIAGNOSIS — Z79899 Other long term (current) drug therapy: Secondary | ICD-10-CM | POA: Diagnosis not present

## 2024-01-20 DIAGNOSIS — K4090 Unilateral inguinal hernia, without obstruction or gangrene, not specified as recurrent: Secondary | ICD-10-CM | POA: Diagnosis not present

## 2024-01-20 DIAGNOSIS — Z86718 Personal history of other venous thrombosis and embolism: Secondary | ICD-10-CM | POA: Diagnosis not present

## 2024-01-20 HISTORY — PX: INGUINAL HERNIA REPAIR: SHX194

## 2024-01-20 HISTORY — DX: Cardiac arrhythmia, unspecified: I49.9

## 2024-01-20 SURGERY — HERNIA REPAIR INGUINAL ADULT
Anesthesia: Regional | Site: Groin | Laterality: Right

## 2024-01-20 MED ORDER — FENTANYL CITRATE (PF) 100 MCG/2ML IJ SOLN
INTRAMUSCULAR | Status: AC
  Filled 2024-01-20: qty 2

## 2024-01-20 MED ORDER — PROPOFOL 10 MG/ML IV BOLUS
INTRAVENOUS | Status: DC | PRN
  Administered 2024-01-20: 150 mg via INTRAVENOUS

## 2024-01-20 MED ORDER — CIPROFLOXACIN IN D5W 400 MG/200ML IV SOLN
INTRAVENOUS | Status: AC
  Filled 2024-01-20: qty 200

## 2024-01-20 MED ORDER — ACETAMINOPHEN 500 MG PO TABS
1000.0000 mg | ORAL_TABLET | Freq: Once | ORAL | Status: AC
  Administered 2024-01-20: 1000 mg via ORAL

## 2024-01-20 MED ORDER — MIDAZOLAM HCL 2 MG/2ML IJ SOLN
INTRAMUSCULAR | Status: AC
  Filled 2024-01-20: qty 2

## 2024-01-20 MED ORDER — LIDOCAINE HCL (CARDIAC) PF 100 MG/5ML IV SOSY
PREFILLED_SYRINGE | INTRAVENOUS | Status: DC | PRN
  Administered 2024-01-20: 40 mg via INTRAVENOUS

## 2024-01-20 MED ORDER — CLONIDINE HCL (ANALGESIA) 100 MCG/ML EP SOLN
EPIDURAL | Status: DC | PRN
  Administered 2024-01-20: 50 ug

## 2024-01-20 MED ORDER — PHENYLEPHRINE HCL (PRESSORS) 10 MG/ML IV SOLN
INTRAVENOUS | Status: DC | PRN
  Administered 2024-01-20 (×5): 80 ug via INTRAVENOUS

## 2024-01-20 MED ORDER — TRAMADOL HCL 50 MG PO TABS: 50.0000 mg | ORAL_TABLET | Freq: Four times a day (QID) | ORAL | 0 refills | Status: DC | PRN

## 2024-01-20 MED ORDER — OXYCODONE HCL 5 MG PO TABS: 5.0000 mg | ORAL_TABLET | Freq: Once | ORAL | Status: DC | PRN

## 2024-01-20 MED ORDER — LACTATED RINGERS IV SOLN: INTRAVENOUS | Status: DC

## 2024-01-20 MED ORDER — BUPIVACAINE-EPINEPHRINE 0.5% -1:200000 IJ SOLN
INTRAMUSCULAR | Status: DC | PRN
  Administered 2024-01-20: 10 mL

## 2024-01-20 MED ORDER — AMISULPRIDE (ANTIEMETIC) 5 MG/2ML IV SOLN: 10.0000 mg | Freq: Once | INTRAVENOUS | Status: DC | PRN

## 2024-01-20 MED ORDER — BUPIVACAINE-EPINEPHRINE (PF) 0.5% -1:200000 IJ SOLN
INTRAMUSCULAR | Status: DC | PRN
  Administered 2024-01-20: 25 mL

## 2024-01-20 MED ORDER — ACETAMINOPHEN 500 MG PO TABS
1000.0000 mg | ORAL_TABLET | ORAL | Status: AC
Start: 2024-01-20 — End: 2024-01-20

## 2024-01-20 MED ORDER — FENTANYL CITRATE (PF) 100 MCG/2ML IJ SOLN
50.0000 ug | Freq: Once | INTRAMUSCULAR | Status: AC
  Administered 2024-01-20: 50 ug via INTRAVENOUS

## 2024-01-20 MED ORDER — OXYCODONE HCL 5 MG/5ML PO SOLN: 5.0000 mg | Freq: Once | ORAL | Status: DC | PRN

## 2024-01-20 MED ORDER — ONDANSETRON HCL 4 MG/2ML IJ SOLN: 4.0000 mg | Freq: Once | INTRAMUSCULAR | Status: DC | PRN

## 2024-01-20 MED ORDER — 0.9 % SODIUM CHLORIDE (POUR BTL) OPTIME
TOPICAL | Status: DC | PRN
  Administered 2024-01-20: 700 mL

## 2024-01-20 MED ORDER — HYDROMORPHONE HCL 1 MG/ML IJ SOLN: 0.2500 mg | INTRAMUSCULAR | Status: DC | PRN

## 2024-01-20 MED ORDER — EPHEDRINE SULFATE (PRESSORS) 50 MG/ML IJ SOLN
INTRAMUSCULAR | Status: DC | PRN
  Administered 2024-01-20 (×2): 5 mg via INTRAVENOUS

## 2024-01-20 MED ORDER — CIPROFLOXACIN IN D5W 400 MG/200ML IV SOLN
400.0000 mg | INTRAVENOUS | Status: AC
  Administered 2024-01-20: 400 mg via INTRAVENOUS

## 2024-01-20 MED ORDER — ACETAMINOPHEN 500 MG PO TABS
ORAL_TABLET | ORAL | Status: AC
  Filled 2024-01-20: qty 2

## 2024-01-20 MED ORDER — FENTANYL CITRATE (PF) 100 MCG/2ML IJ SOLN
INTRAMUSCULAR | Status: DC | PRN
Start: 2024-01-20 — End: 2024-01-20
  Administered 2024-01-20: 25 ug via INTRAVENOUS

## 2024-01-20 SURGICAL SUPPLY — 34 items
BLADE CLIPPER SURG (BLADE) ×1
BLADE SURG 15 STRL LF DISP TIS (BLADE) ×1
CANISTER SUCT 1200ML W/VALVE (MISCELLANEOUS) ×1
CHLORAPREP W/TINT 26 (MISCELLANEOUS) ×1
COVER BACK TABLE 60X90IN (DRAPES) ×1
COVER MAYO STAND STRL (DRAPES) ×1
DERMABOND ADVANCED .7 DNX12 (GAUZE/BANDAGES/DRESSINGS) ×1
DRAIN PENROSE .5X12 LATEX STL (DRAIN) ×1
DRAPE LAPAROTOMY 100X72 PEDS (DRAPES) ×1
DRAPE UTILITY XL STRL (DRAPES) ×1
ELECT REM PT RETURN 9FT ADLT (ELECTROSURGICAL) ×1
GLOVE SURG SIGNA 7.5 PF LTX (GLOVE) ×1
GOWN STRL REUS W/ TWL LRG LVL3 (GOWN DISPOSABLE) ×1
GOWN STRL REUS W/ TWL XL LVL3 (GOWN DISPOSABLE) ×1
MESH PARIETEX PROGRIP RIGHT (Mesh General) ×1 IMPLANT
NDL HYPO 25X1 1.5 SAFETY (NEEDLE) ×1 IMPLANT
NEEDLE HYPO 25X1 1.5 SAFETY (NEEDLE) ×1
NS IRRIG 1000ML POUR BTL (IV SOLUTION) ×1
PACK BASIN DAY SURGERY FS (CUSTOM PROCEDURE TRAY) ×1
PENCIL SMOKE EVACUATOR (MISCELLANEOUS) ×1
SLEEVE SCD COMPRESS KNEE MED (STOCKING) ×1
SPIKE FLUID TRANSFER (MISCELLANEOUS)
SPONGE INTESTINAL PEANUT (DISPOSABLE)
SPONGE T-LAP 4X18 ~~LOC~~+RFID (SPONGE) ×1
SUT MNCRL AB 4-0 PS2 18 (SUTURE) ×1
SUT SILK 2 0 SH (SUTURE) ×1
SUT VIC AB 2-0 CT1 TAPERPNT 27 (SUTURE) ×2
SUT VIC AB 3-0 CT1 27XBRD (SUTURE) ×1
SYR 10ML LL (SYRINGE) ×1
SYR BULB EAR ULCER 3OZ GRN STR (SYRINGE) ×1
SYR CONTROL 10ML LL (SYRINGE)
TOWEL GREEN STERILE FF (TOWEL DISPOSABLE) ×1
TUBE CONNECTING 20X1/4 (TUBING) ×1
YANKAUER SUCT BULB TIP NO VENT (SUCTIONS) ×1

## 2024-01-20 NOTE — Progress Notes (Signed)
Assisted Dr. Rob Fitzgerald with right, transabdominal plane, ultrasound guided block. Side rails up, monitors on throughout procedure. See vital signs in flow sheet. Tolerated Procedure well. 

## 2024-01-20 NOTE — Anesthesia Procedure Notes (Signed)
 Procedure Name: LMA Insertion Date/Time: 01/20/2024 9:33 AM  Performed by: Earmon Phoenix, CRNAPre-anesthesia Checklist: Patient identified, Emergency Drugs available, Suction available, Patient being monitored and Timeout performed Patient Re-evaluated:Patient Re-evaluated prior to induction Oxygen Delivery Method: Circle system utilized Preoxygenation: Pre-oxygenation with 100% oxygen Induction Type: IV induction Ventilation: Mask ventilation without difficulty LMA: LMA inserted LMA Size: 4.0 Number of attempts: 1 Placement Confirmation: positive ETCO2 and breath sounds checked- equal and bilateral Tube secured with: Tape Dental Injury: Teeth and Oropharynx as per pre-operative assessment

## 2024-01-20 NOTE — Anesthesia Procedure Notes (Signed)
 Anesthesia Regional Block: TAP block   Pre-Anesthetic Checklist: , timeout performed,  Correct Patient, Correct Site, Correct Laterality,  Correct Procedure, Correct Position, site marked,  Risks and benefits discussed,  Surgical consent,  Pre-op evaluation,  At surgeon's request and post-op pain management  Laterality: Right  Prep: chloraprep       Needles:  Injection technique: Single-shot  Needle Type: Echogenic Needle     Needle Length: 9cm  Needle Gauge: 21     Additional Needles:   Procedures:,,,, ultrasound used (permanent image in chart),,    Narrative:  Start time: 01/20/2024 8:42 AM End time: 01/20/2024 8:48 AM Injection made incrementally with aspirations every 5 mL.  Performed by: Personally  Anesthesiologist: Marcene Duos, MD

## 2024-01-20 NOTE — Transfer of Care (Signed)
 Immediate Anesthesia Transfer of Care Note  Patient: Dwayne [name]  Procedure(s) Performed: open right HERNIA REPAIR INGUINAL ADULT (Right: Groin)  Patient Location: PACU  Anesthesia Type:General  Level of Consciousness: awake and patient cooperative  Airway & Oxygen Therapy: Patient Spontanous Breathing and Patient connected to face mask oxygen  Post-op Assessment: Report given to RN and Post -op Vital signs reviewed and stable  Post vital signs: Reviewed and stable  Last Vitals:  Vitals Value Taken Time  BP 131/70 01/20/24 1016  Temp 36.3 C 01/20/24 1015  Pulse 63 01/20/24 1021  Resp 20 01/20/24 1021  SpO2 99 % 01/20/24 1021  Vitals shown include unfiled device data.  Last Pain:  Vitals:   01/20/24 0824  PainSc: 0-No pain      Patients Stated Pain Goal: 3 (01/20/24 0824)  Complications: No notable events documented.

## 2024-01-20 NOTE — Discharge Instructions (Addendum)
 [facility] _______[facility]  UMBILICAL OR INGUINAL HERNIA REPAIR: POST OP INSTRUCTIONS  Always review your discharge instruction sheet given to you by the facility where your surgery was performed. IF YOU HAVE DISABILITY OR FAMILY LEAVE FORMS, YOU MUST BRING THEM TO THE OFFICE FOR PROCESSING.   DO NOT GIVE THEM TO YOUR DOCTOR.  1. A  prescription for pain medication may be given to you upon discharge.  Take your pain medication as prescribed, if needed.  If narcotic pain medicine is not needed, then you may take acetaminophen (Tylenol) or ibuprofen (Advil) as needed. 2. Take your usually prescribed medications unless otherwise directed. If you need a refill on your pain medication, please contact your pharmacy.  They will contact our office to request authorization. Prescriptions will not be filled after 5 pm or on week-ends. 3. You should follow a light diet the first 24 hours after arrival home, such as soup and crackers, etc.  Be sure to include lots of fluids daily.  Resume your normal diet the day after surgery. 4.Most patients will experience some swelling and bruising around the umbilicus or in the groin and scrotum.  Ice packs and reclining will help.  Swelling and bruising can take several days to resolve.  6. It is common to experience some constipation if taking pain medication after surgery.  Increasing fluid intake and taking a stool softener (such as Colace) will usually help or prevent this problem from occurring.  A mild laxative (Milk of Magnesia or Miralax) should be taken according to package directions if there are no bowel movements after 48 hours. 7. Unless discharge instructions indicate otherwise, you may remove your bandages 24-48 hours after surgery, and you may shower at that time.  You may have steri-strips (small skin tapes) in place directly over the incision.  These strips should be left on the skin for 7-10 days.  If your surgeon used skin glue on the  incision, you may shower in 24 hours.  The glue will flake off over the next 2-3 weeks.  Any sutures or staples will be removed at the office during your follow-up visit. 8. ACTIVITIES:  You may resume regular (light) daily activities beginning the next day--such as daily self-care, walking, climbing stairs--gradually increasing activities as tolerated.  You may have sexual intercourse when it is comfortable.  Refrain from any heavy lifting or straining until approved by your doctor.  a.You may drive when you are no longer taking prescription pain medication, you can comfortably wear a seatbelt, and you can safely maneuver your car and apply brakes. b.RETURN TO WORK:   _____________________________________________  9.You should see your doctor in the office for a follow-up appointment approximately 2-3 weeks after your surgery.  Make sure that you call for this appointment within a day or two after you arrive home to insure a convenient appointment time. 10.OTHER INSTRUCTIONS: YOU MAY SHOWER STARTING TOMORROW.  DO NOT RESUME SWIMMING FOR 1 1/2 WEEKS ICE PACK AND TYLENOL ALSO FOR PAIN NO LIFTING MORE THAN 15 POUNDS FOR 4 WEEKS RESUME BLOOD THINNING MEDICATION TONIGHT    _____________________________________  WHEN TO CALL YOUR DOCTOR: Fever over 101.0 Inability to urinate Nausea and/or vomiting Extreme swelling or bruising Continued bleeding from incision. Increased pain, redness, or drainage from the incision  The clinic staff is available to answer your questions during regular business hours.  Please don't hesitate to call and ask to speak to one of the nurses for clinical concerns.  If you have  a medical emergency, go to the nearest emergency room or call 911.  A surgeon from [facility] is always on call at the hospital   [address], [place], [place]  [address] ?  [address] [address], [place], [place]   [address] [phone] ? [phone] ? FAX [phone]  [phone] Web site: [email or web address]  You may have Tylenol again after 2:30pm, if needed.   Post Anesthesia Home Care Instructions  Activity: Get plenty of rest for the remainder of the day. A responsible individual must stay with you for 24 hours following the procedure.  For the next 24 hours, DO NOT: -Drive a car -Advertising copywriter -Drink alcoholic beverages -Take any medication unless instructed by your physician -Make any legal decisions or sign important papers.  Meals: Start with liquid foods such as gelatin or soup. Progress to regular foods as tolerated. Avoid greasy, spicy, heavy foods. If nausea and/or vomiting occur, drink only clear liquids until the nausea and/or vomiting subsides. Call your physician if vomiting continues.  Special Instructions/Symptoms: Your throat may feel dry or sore from the anesthesia or the breathing tube placed in your throat during surgery. If this causes discomfort, gargle with warm salt water. The discomfort should disappear within 24 hours.  If you had a scopolamine patch placed behind your ear for the management of post- operative nausea and/or vomiting:  1. The medication in the patch is effective for 72 hours, after which it should be removed.  Wrap patch in a tissue and discard in the trash. Wash hands thoroughly with soap and water. 2. You may remove the patch earlier than 72 hours if you experience unpleasant side effects which may include dry mouth, dizziness or visual disturbances. 3. Avoid touching the patch. Wash your hands with soap and water after contact with the patch.

## 2024-01-20 NOTE — Anesthesia Postprocedure Evaluation (Signed)
 Anesthesia Post Note  Patient: Dwayne [name]  Procedure(s) Performed: open right HERNIA REPAIR INGUINAL ADULT (Right: Groin)     Patient location during evaluation: PACU Anesthesia Type: General Level of consciousness: awake and alert Pain management: pain level controlled Vital Signs Assessment: post-procedure vital signs reviewed and stable Respiratory status: spontaneous breathing, nonlabored ventilation, respiratory function stable and patient connected to nasal cannula oxygen Cardiovascular status: blood pressure returned to baseline and stable Postop Assessment: no apparent nausea or vomiting Anesthetic complications: no  No notable events documented.  Last Vitals:  Vitals:   01/20/24 1030 01/20/24 1043  BP: 120/60 (!) 118/56  Pulse: 63 69  Resp: 20 20  Temp:  (!) 36.3 C  SpO2: 95% 96%    Last Pain:  Vitals:   01/20/24 1043  PainSc: 2                  Kennieth Rad

## 2024-01-20 NOTE — Op Note (Signed)
 open right HERNIA REPAIR INGUINAL ADULT  Procedure Note  Dwayne [name] 01/20/2024   Pre-op Diagnosis: right inguinal hernia     Post-op Diagnosis: same  Procedure(s): OPEN RIGHT HERNIA REPAIR INGUINAL ADULT  Surgeon(s): Abigail Miyamoto, MD  Anesthesia: General  Staff:  Circulator: Griffin Basil, RN; McDonough-Hughes, Maceo Pro, RN Scrub Person: Rolla Etienne  Estimated Blood Loss: Minimal               Findings: The patient was found to have an indirect right inguinal hernia.  It was repaired with a large piece of Prolene ProGrip mesh from [employer]  Procedure: The patient was brought to the operating room and identified as correct patient.  He was placed upon on the operating table and general anesthesia was induced.  His abdomen was then prepped and draped in the usual sterile fashion.  I anesthetized the skin in the right inguinal area with Marcaine and then made a longitudinal incision with a scalpel.  I dissected down through Scarpa's fascia with electrocautery.  The external oblique fascia was identified and opened toward the internal and external rings.  I then controlled the testicular cord and structures with a Penrose drain.  The patient had a moderate-sized indirect hernia sac.  All contents had already been reduced.  I dissected the sac free from the cord structures.  I then right off the base of the sac with a 2-0 silk suture.  I then excised the redundant sac with the cautery.  Next a large piece of Prolene ProGrip mesh was brought onto the field.  I placed it against the pubic tubercle and brought around the cord structures and back to the pubic tubercle.  I then sutured in place to the pubic tubercle with a single 2-0 Vicryl suture.  Wide coverage of the inguinal floor and internal ring appeared to be achieved.  I then closed the external oblique fascia over the top of the mesh with a running 2-0 Vicryl suture.  Scarpa's fascia was reapproximated with interrupted 3-0  Vicryl sutures and the skin was closed with a running 4-0 Monocryl.  Dermabond was then applied.  The patient tolerated the procedure well.  All the counts were correct at the end of the procedure.  The patient was then extubated in the operating room and taken in a stable condition to the recovery room.          Abigail Miyamoto   Date: 01/20/2024  Time: 10:06 AM

## 2024-01-21 ENCOUNTER — Encounter (HOSPITAL_BASED_OUTPATIENT_CLINIC_OR_DEPARTMENT_OTHER): Payer: Self-pay | Admitting: Surgery

## 2024-05-25 ENCOUNTER — Other Ambulatory Visit: Payer: Self-pay | Admitting: *Deleted

## 2024-05-25 MED ORDER — RIVAROXABAN 10 MG PO TABS: 10.0000 mg | ORAL_TABLET | Freq: Every day | ORAL | 1 refills | Status: DC

## 2024-05-25 NOTE — Telephone Encounter (Signed)
 Received refill request for Xarelto  10 mg from [facility]. Refill approved

## 2024-09-10 ENCOUNTER — Other Ambulatory Visit (HOSPITAL_BASED_OUTPATIENT_CLINIC_OR_DEPARTMENT_OTHER): Payer: Self-pay

## 2024-09-10 MED ORDER — COMIRNATY 30 MCG/0.3ML IM SUSY
0.3000 mL | PREFILLED_SYRINGE | Freq: Once | INTRAMUSCULAR | 0 refills | Status: AC
  Filled 2024-09-10: qty 0.3, 1d supply, fill #0

## 2024-10-01 ENCOUNTER — Other Ambulatory Visit: Payer: Self-pay | Admitting: Oncology

## 2024-10-05 ENCOUNTER — Inpatient Hospital Stay: Payer: Medicare Other | Attending: Oncology | Admitting: Oncology

## 2024-10-05 VITALS — BP 133/71 | HR 72 | Temp 98.3°F | Resp 18 | Ht 70.0 in | Wt 166.2 lb

## 2024-10-05 DIAGNOSIS — I2699 Other pulmonary embolism without acute cor pulmonale: Secondary | ICD-10-CM

## 2024-10-05 NOTE — Progress Notes (Signed)
  [facility] OFFICE PROGRESS NOTE   Diagnosis: Chronic anticoagulation  INTERVAL HISTORY:   [name] returns as scheduled.  He continues Xarelto .  No bleeding or symptom of recurrent thrombosis.  He feels well.  He is exercising.  He lost weight when he first moved to [place].  He has regained weight.  Objective:  Vital signs in last 24 hours:  Blood pressure 133/71, pulse 72, temperature 98.3 F (36.8 C), temperature source Temporal, resp. rate 18, height 5' 10 (1.778 m), weight 166 lb 3.2 oz (75.4 kg), SpO2 98%.    HEENT: Oropharynx without visible mass, slight prominence of the left compared to the right submandibular gland Lymphatics: No cervical, supraclavicular, axillary, or inguinal nodes Resp: Lungs clear bilaterally Cardio: Regular rate and rhythm with premature beats GI: No hepatosplenomegaly Vascular: No leg edema, left lower leg is slightly larger than the right side  Lab Results:  Lab Results  Component Value Date   WBC 6.1 03/04/2018   HGB 14.9 03/04/2018   HCT 44.8 03/04/2018   MCV 87 03/04/2018   PLT 197 03/04/2018   NEUTROABS 3.1 03/04/2018    CMP  Lab Results  Component Value Date   NA 138 01/14/2024   K 4.5 01/14/2024   CL 103 01/14/2024   CO2 29 01/14/2024   GLUCOSE 82 01/14/2024   BUN 20 01/14/2024   CREATININE 1.27 (H) 01/14/2024   CALCIUM 9.3 01/14/2024   GFRNONAA 59 (L) 01/14/2024   GFRAA 83 03/04/2018    No results found for: CEA1, CEA, CAN199, CA125  No results found for: INR, LABPROT  Imaging:  No results found.  Medications: I have reviewed the patient's current medications.   Assessment/Plan: Venous thromboembolism Left lower extremity DVT following left knee surgery August 2016, despite 10 days of postoperative aspirin prophylaxis 3 months of Xarelto  anticoagulation Acute left lower extremity DVT (left posterior tibial, peroneal, popliteal, left femoral, and distal left common femoral  veins) and bilateral pulmonary embolism April 2017 Xarelto  anticoagulation resumed, negative hypercoagulation evaluation Rectus sheath hematoma 12/25/2016 anticoagulation held Anticoagulation resumed with Xarelto  at a 10 mg dose Hypertension Left inguinal hernia-open surgical repair 01/22/2022     Disposition: [name] appears stable.  He continues indefinite anticoagulation therapy with the history of recurrent venous thrombosis.  He will continue reduced intensity Xarelto .  He would like to continue follow-up in the hematology clinic.  He will return for an office visit in 1 year.  He will be sure he has obtained a pneumococcal 20 or 21 vaccine.  Arley Hof, MD  10/05/2024  3:22 PM

## 2024-12-05 ENCOUNTER — Inpatient Hospital Stay (HOSPITAL_COMMUNITY)

## 2024-12-05 ENCOUNTER — Emergency Department (HOSPITAL_COMMUNITY)

## 2024-12-05 ENCOUNTER — Inpatient Hospital Stay (HOSPITAL_COMMUNITY)
Admission: EM | Admit: 2024-12-05 | Discharge: 2024-12-09 | DRG: 064 | Disposition: A | Discharge status: Rehabilitation Facility | Attending: Neurology | Admitting: Neurology

## 2024-12-05 ENCOUNTER — Encounter (HOSPITAL_COMMUNITY): Payer: Self-pay

## 2024-12-05 DIAGNOSIS — Z86711 Personal history of pulmonary embolism: Secondary | ICD-10-CM | POA: Diagnosis not present

## 2024-12-05 DIAGNOSIS — Z1152 Encounter for screening for COVID-19: Secondary | ICD-10-CM

## 2024-12-05 DIAGNOSIS — F32A Depression, unspecified: Secondary | ICD-10-CM | POA: Diagnosis present

## 2024-12-05 DIAGNOSIS — I611 Nontraumatic intracerebral hemorrhage in hemisphere, cortical: Secondary | ICD-10-CM | POA: Diagnosis not present

## 2024-12-05 DIAGNOSIS — R1310 Dysphagia, unspecified: Secondary | ICD-10-CM | POA: Diagnosis present

## 2024-12-05 DIAGNOSIS — I610 Nontraumatic intracerebral hemorrhage in hemisphere, subcortical: Secondary | ICD-10-CM | POA: Diagnosis present

## 2024-12-05 DIAGNOSIS — Z79899 Other long term (current) drug therapy: Secondary | ICD-10-CM | POA: Diagnosis not present

## 2024-12-05 DIAGNOSIS — E440 Moderate protein-calorie malnutrition: Secondary | ICD-10-CM | POA: Insufficient documentation

## 2024-12-05 DIAGNOSIS — R29708 NIHSS score 8: Secondary | ICD-10-CM | POA: Diagnosis present

## 2024-12-05 DIAGNOSIS — Z8679 Personal history of other diseases of the circulatory system: Secondary | ICD-10-CM

## 2024-12-05 DIAGNOSIS — Z833 Family history of diabetes mellitus: Secondary | ICD-10-CM | POA: Diagnosis not present

## 2024-12-05 DIAGNOSIS — I351 Nonrheumatic aortic (valve) insufficiency: Secondary | ICD-10-CM | POA: Diagnosis not present

## 2024-12-05 DIAGNOSIS — I619 Nontraumatic intracerebral hemorrhage, unspecified: Secondary | ICD-10-CM | POA: Diagnosis present

## 2024-12-05 DIAGNOSIS — G936 Cerebral edema: Secondary | ICD-10-CM | POA: Diagnosis present

## 2024-12-05 DIAGNOSIS — Z823 Family history of stroke: Secondary | ICD-10-CM

## 2024-12-05 DIAGNOSIS — G8194 Hemiplegia, unspecified affecting left nondominant side: Secondary | ICD-10-CM | POA: Diagnosis present

## 2024-12-05 DIAGNOSIS — I69391 Dysphagia following cerebral infarction: Secondary | ICD-10-CM | POA: Diagnosis not present

## 2024-12-05 DIAGNOSIS — E870 Hyperosmolality and hypernatremia: Secondary | ICD-10-CM | POA: Diagnosis not present

## 2024-12-05 DIAGNOSIS — Z888 Allergy status to other drugs, medicaments and biological substances status: Secondary | ICD-10-CM | POA: Diagnosis not present

## 2024-12-05 DIAGNOSIS — R414 Neurologic neglect syndrome: Secondary | ICD-10-CM | POA: Diagnosis present

## 2024-12-05 DIAGNOSIS — S0083XA Contusion of other part of head, initial encounter: Principal | ICD-10-CM

## 2024-12-05 DIAGNOSIS — T45515A Adverse effect of anticoagulants, initial encounter: Secondary | ICD-10-CM | POA: Diagnosis not present

## 2024-12-05 DIAGNOSIS — R471 Dysarthria and anarthria: Secondary | ICD-10-CM | POA: Diagnosis present

## 2024-12-05 DIAGNOSIS — D72829 Elevated white blood cell count, unspecified: Secondary | ICD-10-CM | POA: Diagnosis not present

## 2024-12-05 DIAGNOSIS — W19XXXA Unspecified fall, initial encounter: Secondary | ICD-10-CM | POA: Diagnosis present

## 2024-12-05 DIAGNOSIS — S065XAA Traumatic subdural hemorrhage with loss of consciousness status unknown, initial encounter: Secondary | ICD-10-CM | POA: Diagnosis not present

## 2024-12-05 DIAGNOSIS — Z7901 Long term (current) use of anticoagulants: Secondary | ICD-10-CM | POA: Diagnosis not present

## 2024-12-05 DIAGNOSIS — S022XXA Fracture of nasal bones, initial encounter for closed fracture: Secondary | ICD-10-CM | POA: Diagnosis present

## 2024-12-05 DIAGNOSIS — G935 Compression of brain: Secondary | ICD-10-CM | POA: Diagnosis present

## 2024-12-05 DIAGNOSIS — E876 Hypokalemia: Secondary | ICD-10-CM | POA: Diagnosis not present

## 2024-12-05 DIAGNOSIS — S065X9S Traumatic subdural hemorrhage with loss of consciousness of unspecified duration, sequela: Secondary | ICD-10-CM | POA: Diagnosis not present

## 2024-12-05 DIAGNOSIS — I10 Essential (primary) hypertension: Secondary | ICD-10-CM | POA: Diagnosis present

## 2024-12-05 DIAGNOSIS — I69319 Unspecified symptoms and signs involving cognitive functions following cerebral infarction: Secondary | ICD-10-CM | POA: Diagnosis not present

## 2024-12-05 DIAGNOSIS — Z86718 Personal history of other venous thrombosis and embolism: Secondary | ICD-10-CM | POA: Diagnosis not present

## 2024-12-05 DIAGNOSIS — R1312 Dysphagia, oropharyngeal phase: Secondary | ICD-10-CM | POA: Diagnosis not present

## 2024-12-05 DIAGNOSIS — Z880 Allergy status to penicillin: Secondary | ICD-10-CM

## 2024-12-05 DIAGNOSIS — I6381 Other cerebral infarction due to occlusion or stenosis of small artery: Secondary | ICD-10-CM | POA: Diagnosis not present

## 2024-12-05 DIAGNOSIS — R509 Fever, unspecified: Secondary | ICD-10-CM | POA: Diagnosis not present

## 2024-12-05 DIAGNOSIS — R29707 NIHSS score 7: Secondary | ICD-10-CM | POA: Diagnosis not present

## 2024-12-05 LAB — COMPREHENSIVE METABOLIC PANEL WITH GFR
ALT: 16 U/L (ref 0–44)
ALT: 18 U/L (ref 0–44)
AST: 77 U/L — ABNORMAL HIGH (ref 15–41)
AST: 72 U/L — ABNORMAL HIGH (ref 15–41)
Albumin: 4.2 g/dL (ref 3.5–5.0)
Albumin: 3.9 g/dL (ref 3.5–5.0)
Alkaline Phosphatase: 69 U/L (ref 38–126)
Alkaline Phosphatase: 67 U/L (ref 38–126)
Anion gap: 13 (ref 5–15)
Anion gap: 12 (ref 5–15)
BUN: 14 mg/dL (ref 8–23)
BUN: 14 mg/dL (ref 8–23)
CO2: 24 mmol/L (ref 22–32)
CO2: 24 mmol/L (ref 22–32)
Calcium: 9.4 mg/dL (ref 8.9–10.3)
Calcium: 9.2 mg/dL (ref 8.9–10.3)
Chloride: 97 mmol/L — ABNORMAL LOW (ref 98–111)
Chloride: 99 mmol/L (ref 98–111)
Creatinine, Ser: 1.09 mg/dL (ref 0.61–1.24)
Creatinine, Ser: 1.07 mg/dL (ref 0.61–1.24)
GFR, Estimated: >60 mL/min
GFR, Estimated: >60 mL/min
Glucose, Bld: 98 mg/dL (ref 70–99)
Glucose, Bld: 92 mg/dL (ref 70–99)
Potassium: 4.9 mmol/L (ref 3.5–5.1)
Potassium: 4.4 mmol/L (ref 3.5–5.1)
Sodium: 134 mmol/L — ABNORMAL LOW (ref 135–145)
Sodium: 135 mmol/L (ref 135–145)
Total Bilirubin: 1.1 mg/dL (ref 0.0–1.2)
Total Bilirubin: 1.1 mg/dL (ref 0.0–1.2)
Total Protein: 7.7 g/dL (ref 6.5–8.1)
Total Protein: 7 g/dL (ref 6.5–8.1)

## 2024-12-05 LAB — CBC
HCT: 43.2 % (ref 39.0–52.0)
HCT: 42.6 % (ref 39.0–52.0)
Hemoglobin: 14.9 g/dL (ref 13.0–17.0)
Hemoglobin: 15 g/dL (ref 13.0–17.0)
MCH: 29.8 pg (ref 26.0–34.0)
MCH: 29.6 pg (ref 26.0–34.0)
MCHC: 34.5 g/dL (ref 30.0–36.0)
MCHC: 35.2 g/dL (ref 30.0–36.0)
MCV: 86.4 fL (ref 80.0–100.0)
MCV: 84.2 fL (ref 80.0–100.0)
Platelets: 208 K/uL (ref 150–400)
Platelets: 162 K/uL (ref 150–400)
RBC: 5 MIL/uL (ref 4.22–5.81)
RBC: 5.06 MIL/uL (ref 4.22–5.81)
RDW: 13 % (ref 11.5–15.5)
RDW: 12.6 % (ref 11.5–15.5)
WBC: 12.5 K/uL — ABNORMAL HIGH (ref 4.0–10.5)
WBC: 12.7 K/uL — ABNORMAL HIGH (ref 4.0–10.5)
nRBC: 0 % (ref 0.0–0.2)
nRBC: 0 % (ref 0.0–0.2)

## 2024-12-05 LAB — RESP PANEL BY RT-PCR (RSV, FLU A&B, COVID)  RVPGX2
Influenza A by PCR: NEGATIVE
Influenza B by PCR: NEGATIVE
Resp Syncytial Virus by PCR: NEGATIVE
SARS Coronavirus 2 by RT PCR: NEGATIVE

## 2024-12-05 LAB — LIPID PANEL
Cholesterol: 178 mg/dL (ref 0–200)
HDL: 52 mg/dL
LDL Cholesterol: 113 mg/dL — ABNORMAL HIGH (ref 0–99)
Total CHOL/HDL Ratio: 3.4 ratio
Triglycerides: 61 mg/dL
VLDL: 12 mg/dL (ref 0–40)

## 2024-12-05 LAB — URINE DRUG SCREEN
Amphetamines: NEGATIVE
Barbiturates: NEGATIVE
Benzodiazepines: NEGATIVE
Cocaine: NEGATIVE
Fentanyl: NEGATIVE
Methadone Scn, Ur: NEGATIVE
Opiates: NEGATIVE
Tetrahydrocannabinol: NEGATIVE

## 2024-12-05 LAB — URINALYSIS, W/ REFLEX TO CULTURE (INFECTION SUSPECTED)
Bacteria, UA: NONE SEEN
Bilirubin Urine: NEGATIVE
Glucose, UA: NEGATIVE mg/dL
Ketones, ur: 5 mg/dL — AB
Leukocytes,Ua: NEGATIVE
Nitrite: NEGATIVE
Protein, ur: 30 mg/dL — AB
Specific Gravity, Urine: 1.039 — ABNORMAL HIGH (ref 1.005–1.030)
pH: 5 (ref 5.0–8.0)

## 2024-12-05 LAB — ETHANOL
Alcohol, Ethyl (B): <15 mg/dL
Alcohol, Ethyl (B): <15 mg/dL

## 2024-12-05 LAB — I-STAT CHEM 8, ED
BUN: 18 mg/dL (ref 8–23)
Calcium, Ion: 1.08 mmol/L — ABNORMAL LOW (ref 1.15–1.40)
Chloride: 98 mmol/L (ref 98–111)
Creatinine, Ser: 1.1 mg/dL (ref 0.61–1.24)
Glucose, Bld: 103 mg/dL — ABNORMAL HIGH (ref 70–99)
HCT: 45 % (ref 39.0–52.0)
Hemoglobin: 15.3 g/dL (ref 13.0–17.0)
Potassium: 4.2 mmol/L (ref 3.5–5.1)
Sodium: 136 mmol/L (ref 135–145)
TCO2: 27 mmol/L (ref 22–32)

## 2024-12-05 LAB — PROTIME-INR
INR: 1.5 — ABNORMAL HIGH (ref 0.8–1.2)
INR: 1.1 (ref 0.8–1.2)
Prothrombin Time: 18.8 s — ABNORMAL HIGH (ref 11.4–15.2)
Prothrombin Time: 15.2 s (ref 11.4–15.2)

## 2024-12-05 LAB — I-STAT CG4 LACTIC ACID, ED
Lactic Acid, Venous: 1.3 mmol/L (ref 0.5–1.9)
Lactic Acid, Venous: 1.7 mmol/L (ref 0.5–1.9)

## 2024-12-05 LAB — MRSA NEXT GEN BY PCR, NASAL: MRSA by PCR Next Gen: NOT DETECTED

## 2024-12-05 LAB — SODIUM: Sodium: 137 mmol/L (ref 135–145)

## 2024-12-05 MED ORDER — CLEVIDIPINE BUTYRATE 0.5 MG/ML IV EMUL
0.0000 mg/h | INTRAVENOUS | Status: DC
  Filled 2024-12-05: qty 100

## 2024-12-05 MED ORDER — ACETAMINOPHEN 325 MG PO TABS
650.0000 mg | ORAL_TABLET | Freq: Once | ORAL | Status: AC
  Administered 2024-12-05: 650 mg via ORAL
  Filled 2024-12-05: qty 2

## 2024-12-05 MED ORDER — ACETAMINOPHEN 160 MG/5ML PO SOLN
650.0000 mg | ORAL | Status: DC | PRN
  Administered 2024-12-08: 650 mg
  Filled 2024-12-05 (×2): qty 20.3

## 2024-12-05 MED ORDER — PANTOPRAZOLE SODIUM 40 MG IV SOLR
40.0000 mg | Freq: Every day | INTRAVENOUS | Status: DC
  Administered 2024-12-05 – 2024-12-08 (×4): 40 mg via INTRAVENOUS
  Filled 2024-12-05 (×4): qty 10

## 2024-12-05 MED ORDER — LABETALOL HCL 5 MG/ML IV SOLN: 10.0000 mg | INTRAVENOUS | Status: DC | PRN

## 2024-12-05 MED ORDER — SENNOSIDES-DOCUSATE SODIUM 8.6-50 MG PO TABS
1.0000 | ORAL_TABLET | Freq: Two times a day (BID) | ORAL | Status: DC
  Administered 2024-12-07: 1 via ORAL
  Filled 2024-12-05 (×2): qty 1

## 2024-12-05 MED ORDER — SODIUM CHLORIDE 3 % IV SOLN
INTRAVENOUS | Status: DC
  Filled 2024-12-05 (×6): qty 500

## 2024-12-05 MED ORDER — PROTHROMBIN COMPLEX CONC HUMAN 500 UNITS IV KIT
3314.0000 [IU] | PACK | Status: AC
  Administered 2024-12-05: 3314 [IU] via INTRAVENOUS
  Filled 2024-12-05: qty 3314

## 2024-12-05 MED ORDER — ACETAMINOPHEN 650 MG RE SUPP
650.0000 mg | RECTAL | Status: DC | PRN
  Administered 2024-12-06 (×4): 650 mg via RECTAL
  Filled 2024-12-05 (×4): qty 1

## 2024-12-05 MED ORDER — ACETAMINOPHEN 325 MG PO TABS
650.0000 mg | ORAL_TABLET | ORAL | Status: DC | PRN
  Administered 2024-12-08 – 2024-12-09 (×2): 650 mg via ORAL
  Filled 2024-12-05 (×2): qty 2

## 2024-12-05 MED ORDER — HYDRALAZINE HCL 20 MG/ML IJ SOLN
10.0000 mg | INTRAMUSCULAR | Status: DC | PRN
  Administered 2024-12-06 (×2): 10 mg via INTRAVENOUS
  Administered 2024-12-07: 20 mg via INTRAVENOUS
  Filled 2024-12-05 (×3): qty 1

## 2024-12-05 MED ORDER — IOHEXOL 350 MG/ML SOLN
75.0000 mL | Freq: Once | INTRAVENOUS | Status: AC | PRN
  Administered 2024-12-05: 75 mL via INTRAVENOUS

## 2024-12-05 MED ORDER — SODIUM CHLORIDE 3 % IV BOLUS
250.0000 mL | Freq: Once | INTRAVENOUS | Status: AC
  Administered 2024-12-05: 250 mL via INTRAVENOUS
  Filled 2024-12-05: qty 500

## 2024-12-05 MED ORDER — STROKE: EARLY STAGES OF RECOVERY BOOK
Freq: Once | Status: AC
  Filled 2024-12-05: qty 1

## 2024-12-05 MED ORDER — LACTATED RINGERS IV SOLN: INTRAVENOUS | Status: AC

## 2024-12-05 NOTE — Consult Note (Signed)
 Reason for Consult: Intracerebral hemorrhage Referring Physician: Emergency department  Dwayne [name] is an 79 y.o. male.  HPI: 79 year old male with history of DVT and pulmonary embolus on anticoagulation.  Patient presents to the hospital with history of headache and lethargy for the past 24 hours.  He has had 2 separate ground-level falls with no obvious major head injury.  The patient has become progressively confused with some left-sided weakness.  Workup demonstrates evidence of a mixed density lobar hemorrhage in his right posterior temporal lobe.  Patient is currently hemodynamically stable.  He is awake and aware.  He is oriented to person and place.  He complains of some mild headache.  He notes some neck discomfort.  He has no other complaints of pain.  Past Medical History:  Diagnosis Date   Arrhythmia    Bilateral pulmonary embolism (HCC) 05/28/2016   03/03/16   DVT of lower limb, acute (HCC)    left popliteal tibial and peroneal vein   Hypertension    Retroperitoneal bleed 12/25/2016   Cough x 1 week on Xarelto  extensive abdominal wall hematoma tracking down to penis & scrotum    Past Surgical History:  Procedure Laterality Date   INGUINAL HERNIA REPAIR Left 01/22/2022   Procedure: OPEN LEFT INGUINAL HERNIA REPAIR WITH MESH;  Surgeon: Vernetta Berg, MD;  Location: [facility];  Service: General;  Laterality: Left;   INGUINAL HERNIA REPAIR Right 01/20/2024   Procedure: open right HERNIA REPAIR INGUINAL ADULT;  Surgeon: Vernetta Berg, MD;  Location: [facility];  Service: General;  Laterality: Right;  LMA   KNEE SURGERY      Family History  Problem Relation Age of Onset   Stroke Father    Diabetes Father    Stroke Maternal Grandmother    Cancer Paternal Grandfather     Social History:  reports that he has never smoked. He has never used smokeless tobacco. He reports current alcohol use. He reports that he does not use  drugs.  Allergies: Allergies[1]  Medications: I have reviewed the patient's current medications.  Results for orders placed or performed during the hospital encounter of 12/05/24 (from the past 48 hours)  CBC     Status: Abnormal   Collection Time: 12/05/24 10:15 AM  Result Value Ref Range   WBC 12.5 (H) 4.0 - 10.5 K/uL   RBC 5.00 4.22 - 5.81 MIL/uL   Hemoglobin 14.9 13.0 - 17.0 g/dL   HCT 56.7 60.9 - 47.9 %   MCV 86.4 80.0 - 100.0 fL   MCH 29.8 26.0 - 34.0 pg   MCHC 34.5 30.0 - 36.0 g/dL   RDW 86.9 88.4 - 84.4 %   Platelets 208 150 - 400 K/uL   nRBC 0.0 0.0 - 0.2 %    Comment: Performed at [facility], [address], [place], [place] [address]  I-Stat Chem 8, ED     Status: Abnormal   Collection Time: 12/05/24 10:24 AM  Result Value Ref Range   Sodium 136 135 - 145 mmol/L   Potassium 4.2 3.5 - 5.1 mmol/L   Chloride 98 98 - 111 mmol/L   BUN 18 8 - 23 mg/dL   Creatinine, Ser 8.89 0.61 - 1.24 mg/dL   Glucose, Bld 896 (H) 70 - 99 mg/dL    Comment: Glucose reference range applies only to samples taken after fasting for at least 8 hours.   Calcium, Ion 1.08 (L) 1.15 - 1.40 mmol/L   TCO2 27  22 - 32 mmol/L   Hemoglobin 15.3 13.0 - 17.0 g/dL   HCT 54.9 60.9 - 47.9 %  I-Stat Lactic Acid, ED     Status: None   Collection Time: 12/05/24 10:24 AM  Result Value Ref Range   Lactic Acid, Venous 1.3 0.5 - 1.9 mmol/L    CT CERVICAL SPINE WO CONTRAST Result Date: 12/05/2024 EXAM: CT CERVICAL SPINE WITHOUT CONTRAST 12/05/2024 10:53:08 AM TECHNIQUE: CT of the cervical spine was performed without the administration of intravenous contrast. Multiplanar reformatted images are provided for review. Automated exposure control, iterative reconstruction, and/or weight based adjustment of the mA/kV was utilized to reduce the radiation dose to as low as reasonably achievable. COMPARISON: CT cervical spine 10/08/2020. CLINICAL HISTORY: Polytrauma, blunt. FINDINGS: BONES AND ALIGNMENT: Chronic  trace anterolisthesis of C7 on T1 and T1 on T2 and trace retrolisthesis of C3 on C4. Mild left convex curvature of the lower cervical spine. No acute fracture or suspicious lesion. DEGENERATIVE CHANGES: Moderate multilevel cervical disc degeneration. Asymmetrically advanced right sided facet arthrosis at multiple levels. Moderate to severe multilevel neural foraminal stenosis. SOFT TISSUES: No prevertebral soft tissue swelling. IMPRESSION: 1. No acute cervical spine fracture or traumatic malalignment. Electronically signed by: Dasie Hamburg MD MD 12/05/2024 11:21 AM EST RP Workstation: [id number]   CT HEAD WO CONTRAST Result Date: 12/05/2024 EXAM: CT HEAD WITHOUT CONTRAST 12/05/2024 10:53:08 AM TECHNIQUE: CT of the head was performed without the administration of intravenous contrast. Automated exposure control, iterative reconstruction, and/or weight based adjustment of the mA/kV was utilized to reduce the radiation dose to as low as reasonably achievable. COMPARISON: Head CT 10/08/2020. CLINICAL HISTORY: Head trauma, moderate-severe. FINDINGS: BRAIN AND VENTRICLES: Large acute parenchymal hemorrhage centered in the posterior right temporal lobe measures 8.0 x 5.0 x 3.7 cm (AP x transverse x craniocaudal) (estimated volume of 74 ml). There is moderate surrounding vasogenic edema with mass effect including regional sulcal effacement, effacement of the right lateral and third ventricles, and 6 mm of leftward midline shift. There is mild mass effect on the midbrain and partial effacement of the upper basilar cisterns. No hydrocephalus, contralateral infarct, or definite extra-axial fluid collection is identified. Calcified atherosclerosis at the skull base. ORBITS: Reported on today's separate maxillofacial CT. SINUSES: Reported on today's separate maxillofacial CT. SOFT TISSUES AND SKULL: Left frontal scalp soft tissue swelling. No skull fracture. Critical results were communicated by telephone to Dr. Doretha on  12/05/2024 at 11:05 AM. IMPRESSION: 1. Large acute right temporal parenchymal hemorrhage with moderate edema and 6 mm leftward midline shift. 2. Left frontal scalp soft tissue swelling. Electronically signed by: Dasie Hamburg MD MD 12/05/2024 11:17 AM EST RP Workstation: [id number]   DG Chest Port 1 View Result Date: 12/05/2024 CLINICAL DATA:  Blunt chest trauma.  On anticoagulation. EXAM: PORTABLE CHEST 1 VIEW COMPARISON:  12/20/2016 FINDINGS: The lung apices are not visualized on this exam. Given this limitation, no pneumothorax or pleural effusion are visualized. Both lungs are clear. Heart size and mediastinal contours are within normal limits. IMPRESSION: Lung apices not included on this exam. No acute findings identified. Electronically Signed   By: Norleen DELENA Kil M.D.   On: 12/05/2024 11:00    Review of systems not obtained due to patient factors. Blood pressure (!) 142/79, pulse 74, temperature 100.1 F (37.8 C), temperature source Rectal, resp. rate 15, weight 75.3 kg, SpO2 100%. Patient is awake and aware.  He is oriented to person and place.  Speech is reasonably fluent.  Pupils are 4 mm  and reactive bilaterally.  Gaze is conjugate and midline.  Extraocular movements are full.  Facial movement with some slight left-sided facial weakness.  Tongue protrudes in the midline.  Motor examination of the extremities revealed good voluntary movement bilaterally with 4/5 weakness of his left upper extremity with some increased tone.  Left lower extremity with 4/5 strength.  Sensory examination nonfocal.  Examination head ears eyes Throat demonstrates no evidence of significant cranial trauma laceration or bony abnormality.  Oropharynx, nasopharynx  Tori canals are clear.  Chest and abdomen are reasonably benign.  Extremities are free of major deformity.  Assessment/Plan: Mixed density and lobar hemorrhage in his posterior right temporal lobe.  Situation complicated by anticoagulation.  Suspect this may  be an ischemic insult with secondary conversion.  Recommend ICU observation.  Recommend anticoagulation reversal.  Observe with neurochecks.  No indication for operative intervention at present.  Victory A Lahela Woodin 12/05/2024, 11:26 AM         [1]  Allergies Allergen Reactions   Penicillins    Thimerosal (Thiomersal) Other (See Comments)

## 2024-12-05 NOTE — Progress Notes (Signed)
" °   12/05/24 2230  NIH Stroke Scale   Dizziness Present No  Headache Present Yes  Interval Neuro change  Level of Consciousness (1a.)    1  LOC Questions (1b. )    0  LOC Commands (1c. )    0  Best Gaze (2. )   1  Visual (3. )   2  Facial Palsy (4. )     0  Motor Arm, Left (5a. )    3  Motor Arm, Right (5b. )  0  Motor Leg, Left (6a. )   3  Motor Leg, Right (6b. )  2  Limb Ataxia (7. ) 0  Sensory (8. )   1  Best Language (9. )   1  Dysarthria (10. ) 1  Extinction/Inattention (11.)    1  Complete NIHSS TOTAL 16   Neuro change along with increasing agitation reported by this RN to Neuro MD on call [place]. Verbal orders to initiate hypertonic saline %3. Will update NSGY as well.  Care ongoing.  Berneda Essex, RN "

## 2024-12-05 NOTE — Progress Notes (Signed)
 VASCULAR LAB    Bilateral lower extremity venous duplex has been performed.  See CV proc for preliminary results.   Eleyna Brugh, RVT 12/05/2024, 4:14 PM

## 2024-12-05 NOTE — ED Triage Notes (Signed)
 Pt BIB [facility] from an independent living. Pt fell last night and this morning. Pt's baseline is A&O X4, 100% independent. the patient was confused. L eye injury. Repetitive question. Not able to bear weight. Last fall was at 1am this morning.

## 2024-12-05 NOTE — Progress Notes (Signed)
 Orthopedic Tech Progress Note Patient Details:  Dwayne [name] [birth year] [id number] Level 2 Trauma  Patient ID: [name], male   DOB: Sep 03, 1946, 79 y.o.   MRN: [id number]  Massie FORBES Bar 12/05/2024, 10:13 AM

## 2024-12-05 NOTE — Progress Notes (Signed)
 Patient somewhat more somnolent and restless.  Left-sided weakness also slightly worse.  Follow-up head CT scan demonstrates some mild progression of his right posterior temporal hemorrhage.  Mass effect about the same.  I would recommend continued observational care.  I think it is reasonable to add hypertonic saline.  I do not recommend surgical intervention at present.

## 2024-12-05 NOTE — Plan of Care (Signed)
 Repeat scan with interval increase in bleed and midline shift. NIH stroke scale also appears to be worsening from an 8 to somewhere around 15 or 16 at this time. Neurosurgery contacted-they have reviewed scans and recommend medical management and no surgical intervention at this time.   -- Eligio Lav, MD Neurologist [facility]

## 2024-12-05 NOTE — Plan of Care (Signed)
 Repeat head CT shows increased and intracranial hemorrhage 83 cc 7 mm shift.  Overall fairly stable.  Repeat CT in 6 hours.  Checked out to on-call neurologist Dr. Deedra

## 2024-12-05 NOTE — H&P (Addendum)
 NEUROLOGY H&P NOTE   Date of service: December 05, 2024 Patient Name: Dwayne [name] MRN:  [id number] DOB:  Oct 03, 1946 Chief Complaint: ICH  History of Present Illness  Dwayne [name] is a 79 y.o. 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  Yesterday morning woke up with a headache did not feel good.  His wife says that when he does not feel good he lays around in bed which is what he did all day.  He did not eat lunch.  She went to check on him in the evening and he was still in bed not feeling well.  Around midnight he tried to go use the bathroom and fell she had to help him get back in the bed.  He fell again around 1 or so in the morning.  We did not get better she called 911 to bring him to the hospital.  Initially put in as a code trauma however head CT revealed intracranial hemorrhage and neurology was consulted.  Discussed with EDP.  On Xarelto  reversed with Kcentra .  Neurosurgery consulted no immediate intervention.   Last known well: Greater than 24 hours Modified rankin score: 0-Completely asymptomatic and back to baseline post- stroke ICH Score: 1 tNKASE: Not offered due to ICH Thrombectomy: not offered due to ICH NIHSS components Score: Comment  1a Level of Conscious 0[x]  1[]  2[]  3[]      1b LOC Questions 0[x]  1[]  2[]       1c LOC Commands 0[x]  1[]  2[]       2 Best Gaze 0[]  1[x]  2[]       3 Visual 0[]  1[x]  2[]  3[]      4 Facial Palsy 0[x]  1[]  2[]  3[]      5a Motor Arm - left 0[]  1[x]  2[]  3[]  4[]  UN[]    5b Motor Arm - Right 0[x]  1[]  2[]  3[]  4[]  UN[]    6a Motor Leg - Left 0[]  1[x]  2[]  3[]  4[]  UN[]    6b Motor Leg - Right 0[x]  1[]  2[]  3[]  4[]  UN[]    7 Limb Ataxia 0[x]  1[]  2[]  UN[]      8 Sensory 0[]  1[x]  2[]  UN[]      9 Best Language 0[]  1[x]  2[]  3[]      10 Dysarthria 0[]  1[x]  2[]  UN[]      11 Extinct. and Inattention 0[]  1[x]  2[]       TOTAL:8       ROS  Comprehensive ROS performed and pertinent positives documented in the  HPI.   Past History   Past Medical History:  Diagnosis Date   Arrhythmia    Bilateral pulmonary embolism (HCC) 05/28/2016   03/03/16   DVT of lower limb, acute (HCC)    left popliteal tibial and peroneal vein   Hypertension    Retroperitoneal bleed 12/25/2016   Cough x 1 week on Xarelto  extensive abdominal wall hematoma tracking down to penis & scrotum   Past Surgical History:  Procedure Laterality Date   INGUINAL HERNIA REPAIR Left 01/22/2022   Procedure: OPEN LEFT INGUINAL HERNIA REPAIR WITH MESH;  Surgeon: Vernetta Berg, MD;  Location: [facility];  Service: General;  Laterality: Left;   INGUINAL HERNIA REPAIR Right 01/20/2024   Procedure: open right HERNIA REPAIR INGUINAL ADULT;  Surgeon: Vernetta Berg, MD;  Location:  SURGERY CENTER;  Service: General;  Laterality: Right;  LMA   KNEE SURGERY     Family History  Problem Relation Age of Onset   Stroke Father    Diabetes Father  Stroke Maternal Grandmother    Cancer Paternal Grandfather    Social History   Socioeconomic History   Marital status: Married    Spouse name: Not on file   Number of children: Not on file   Years of education: Not on file   Highest education level: Not on file  Occupational History   Not on file  Tobacco Use   Smoking status: Never   Smokeless tobacco: Never  Substance and Sexual Activity   Alcohol use: Yes    Alcohol/week: 0.0 standard drinks of alcohol    Comment: rarely   Drug use: Never   Sexual activity: Not on file  Other Topics Concern   Not on file  Social History Narrative   Not on file   Social Drivers of Health   Tobacco Use: Low Risk (12/05/2024)   Patient History    Smoking Tobacco Use: Never    Smokeless Tobacco Use: Never    Passive Exposure: Not on file  Financial Resource Strain: Not on file  Food Insecurity: Not on file  Transportation Needs: Not on file  Physical Activity: Not on file  Stress: Not on file  Social Connections:  Not on file  Depression (PHQ2-9): Low Risk (10/05/2024)   Depression (PHQ2-9)    PHQ-2 Score: 0  Alcohol Screen: Not on file  Housing: Unknown (12/20/2023)   Received from [facility]   Epic    Unable to Pay for Housing in the Last Year: Not on file    Number of Times Moved in the Last Year: Not on file    At any time in the past 12 months, were you homeless or living in a shelter (including now)?: No  Utilities: Not on file  Health Literacy: Not on file   Allergies[1]  Medications  (Not in a hospital admission)    Vitals   Vitals:   12/05/24 1034 12/05/24 1035 12/05/24 1115 12/05/24 1145  BP: (!) 145/71  (!) 142/79 139/73  Pulse: 68 69 74 67  Resp: 12 20 15 16   Temp:      TempSrc:      SpO2: 99% 100% 100% 100%  Weight:  75.3 kg    Height:         Body mass index is 23.82 kg/m.  Physical Exam   Constitutional: In C collar. Psych: Affect appropriate to situation.  Eyes: No scleral injection.  HENT: No OP obstruction. Left eye bruise noted. Head: Normocephalic.  Cardiovascular: Normal rate and regular rhythm.  Respiratory: Effort normal, non-labored breathing.  GI: Soft.  No distension. There is no tenderness.  Skin: WDI.   Neurologic Examination  See above. PERRL. Right gaze preference does not cross midline.   Labs   CBC:  Recent Labs  Lab 12/05/24 1015 12/05/24 1024  WBC 12.5*  --   HGB 14.9 15.3  HCT 43.2 45.0  MCV 86.4  --   PLT 208  --     Basic Metabolic Panel:  Lab Results  Component Value Date   NA 136 12/05/2024   K 4.2 12/05/2024   CO2 24 12/05/2024   GLUCOSE 103 (H) 12/05/2024   BUN 18 12/05/2024   CREATININE 1.10 12/05/2024   CALCIUM 9.4 12/05/2024   GFRNONAA >60 12/05/2024   GFRAA 83 03/04/2018   Lipid Panel: No results found for: LDLCALC HgbA1c: No results found for: HGBA1C Urine Drug Screen: No results found for: LABOPIA, COCAINSCRNUR, LABBENZ, AMPHETMU, THCU, LABBARB  Alcohol Level  Component Value Date/Time   [facility] <15 12/05/2024 1015   INR  Lab Results  Component Value Date   INR 1.5 (H) 12/05/2024   APTT No results found for: APTT   CT Head without contrast(Personally reviewed): Large acute right temporal intracranial hemorrhage volume 74 mL with vasogenic edema and 6 mm leftward shift.  CT angio Head and Neck with contrast(Personally reviewed): No aneurysm noted.   MRI Brain(Personally reviewed): Pending    Impression   Dwayne [name] is a 79 y.o. 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  First DVT was after his knee arthroscopy in 2016 and found to have a pulmonary embolus.  Was placed on anticoagulation for couple of months and then titrated off.  Had recurrent DVTs since then and has been on lifelong anticoagulation with Xarelto .  Being followed by hematology outpatient.  Hypercoagulable workup done previously was unrevealing.  Headache started yesterday morning laid around in bed all day had a fall around midnight and then again around 1 or 2 in the morning.  Found to have ICH on head CT.  Reversed with Kcentra .  Neurosurgery consulted will continue to monitor for now.  No need for hypertonic's as he is awake alert.  GCS score 15  There is a spot sign on head CT concerning for active bleeding.  Will monitor closely with repeat head CT.  Unclear if you took Xarelto  yesterday but will reverse with Kcentra .   Started on Cleviprex .  Hold on hypertonic's for now  ICH score of 1 which gives a 13% mortality.  Primary Diagnosis:  Nontraumatic intracerebral hemorrhage in hemisphere, subcortical  Secondary Diagnosis: Brain compression  History of recurrent DVTs and pulmonary embolisms Hypertension  Recommendations   - Admit to ICU - Stability scan in 6 hours or STAT with any neurological decline - Frequent neuro checks; q55min for 1 hour, then q1hour - No antiplatelets or anticoagulants due to ICH -Ultrasound of the legs and  consider chest CTA to evaluate for pulmonary embolus - SCD for DVT prophylaxis, pharmacological DVT ppx at 24 hours if ICH is stable - Blood pressure control with goal systolic 130 - 140, cleverplex and labetalol  PRN - Stroke labs, HgbA1c, fasting lipid panel - MRI brain with and without contrast when stabilized to evaluate for underlying mass - Risk factor modification - Echocardiogram - PT consult, OT consult, Speech consult. - Stroke team to follow  ______________________________________________________________________   Signed,  Zeke CHRISTELLA Raddle, MD [facility]   This patient is critically ill due to Large ICH with brain compression and at significant risk of neurological worsening, death form heart failure, respiratory failure, recurrent stroke, bleeding from [facility], seizure, sepsis. This patient's care requires constant monitoring of vital signs, hemodynamics, respiratory and cardiac monitoring, review of multiple databases, neurological assessment, discussion with family, other specialists and medical decision making of high complexity. I spent 60 minutes of neurocritical care time in the care of this patient.   Channing Savich,MD      [1]  Allergies Allergen Reactions   Penicillins    Thimerosal (Thiomersal) Other (See Comments)

## 2024-12-05 NOTE — Progress Notes (Signed)
 Patient ID: Dwayne [name], male   DOB: 26-Dec-1945, 79 y.o.   MRN: [id number]  Returned to evaluate patient.  He is doing well.  Still alert oriented.  Exam is the same.  He is mentating a little bit better as his headache has improved with Tylenol .  More talkative.  His wife had many questions for me discussed the course of his inpatient admission and plan to get another CT later to reassess his bleeding.  Discussed that we reversed his anticoagulation.  Continue plan as discussed.  All questions answered from both the patient and his wife

## 2024-12-05 NOTE — ED Provider Notes (Addendum)
 " [place] EMERGENCY DEPARTMENT AT [facility] Provider Note   CSN: [id number] Arrival date & time: 12/05/24  1004     Patient presents with: Dwayne [name] is a 79 y.o. male.   Pt is a 79 year old male with a history of hypertension, DVT and PE on Xarelto  daily, prior retroperitoneal bleed who is presenting today with EMS for a fall on thinners as a level 2 trauma.  Patient's wife gives most of the history and reports that yesterday he slept most of the day and had been complaining a little bit of a headache.  However he took his last dose of Xarelto  at midnight last night and went to bed.  Around 1 AM he got up to use the bathroom and wife reports he fell.  He also seemed slightly confused but when she checked him to have him move his arms and legs he could do all that and so went back to bed.  However this morning he fell again and when she called the staff at their independent living facility and they came to see him he was unable to stand.  They also noted he was confused and not at his baseline.  EMS reports that he was not really moving the left side for them and route.  Patient denies having a headache at this time.  Denies pain anywhere else except in his nose.  He has not had cough or congestion.  Denies any nausea or vomiting.  The history is provided by the patient, the EMS personnel, the spouse and medical records.  Fall       Prior to Admission medications  Medication Sig Start Date End Date Taking? Authorizing Provider  Cholecalciferol (VITAMIN D3) 1000 units CAPS Take 1,000 Units by mouth daily at 6 (six) AM.   Yes [provider]  lisinopril-hydrochlorothiazide (PRINZIDE,ZESTORETIC) 10-12.5 MG per tablet Take 1 tablet by mouth daily.   Yes [provider]  Multiple Vitamin (MULTIVITAMIN) capsule Take 1 capsule by mouth daily.   Yes [provider]  rivaroxaban  (XARELTO ) 10 MG TABS tablet TAKE 1 TABLET BY MOUTH DAILY 10/01/24   Yes Cloretta Arley NOVAK, MD    Allergies: Penicillins and Thimerosal (thiomersal)    Review of Systems  Updated Vital Signs BP (!) 141/79   Pulse 70   Temp 100.1 F (37.8 C) (Rectal)   Resp 19   Ht 5' 10 (1.778 m)   Wt 75.3 kg   SpO2 100%   BMI 23.82 kg/m   Physical Exam Vitals and nursing note reviewed.  Constitutional:      General: He is not in acute distress.    Appearance: He is well-developed.  HENT:     Head: Normocephalic.     Comments: Ecchymosis noted around the left eye and over the nose.  Patient will not cross midline to the left when testing extraocular movements. Eyes:     General: Visual field deficit present.     Conjunctiva/sclera: Conjunctivae normal.     Pupils: Pupils are equal, round, and reactive to light.  Cardiovascular:     Rate and Rhythm: Normal rate and regular rhythm.     Heart sounds: No murmur heard. Pulmonary:     Effort: Pulmonary effort is normal. No respiratory distress.     Breath sounds: Normal breath sounds. No wheezing or rales.  Abdominal:     General: There is no distension.     Palpations: Abdomen is soft.  Tenderness: There is no abdominal tenderness. There is no guarding or rebound.  Musculoskeletal:        General: No tenderness. Normal range of motion.     Cervical back: Normal range of motion and neck supple.  Skin:    General: Skin is warm and dry.     Findings: No erythema or rash.  Neurological:     Mental Status: He is alert and oriented to person, place, and time.     Cranial Nerves: No facial asymmetry.     Sensory: Sensation is intact.     Motor: Motor function is intact. No weakness or pronator drift.     Comments: Left-sided hemineglect, left-sided field deficit, patient is able to lift bilateral arms and legs without difficulty when instructed to.  However you have to specifically tell him to lift his left arm and left leg.  No acute speech abnormality at this time without aphasia noted.  Face appears  symmetric without droop.  Psychiatric:        Behavior: Behavior normal.     (all labs ordered are listed, but only abnormal results are displayed) Labs Reviewed  COMPREHENSIVE METABOLIC PANEL WITH GFR - Abnormal; Notable for the following components:      Result Value   Sodium 134 (*)    Chloride 97 (*)    AST 77 (*)    All other components within normal limits  CBC - Abnormal; Notable for the following components:   WBC 12.5 (*)    All other components within normal limits  PROTIME-INR - Abnormal; Notable for the following components:   Prothrombin  Time 18.8 (*)    INR 1.5 (*)    All other components within normal limits  I-STAT CHEM 8, ED - Abnormal; Notable for the following components:   Glucose, Bld 103 (*)    Calcium, Ion 1.08 (*)    All other components within normal limits  RESP PANEL BY RT-PCR (RSV, FLU A&B, COVID)  RVPGX2  CULTURE, BLOOD (ROUTINE X 2)  CULTURE, BLOOD (ROUTINE X 2)  ETHANOL  URINALYSIS, W/ REFLEX TO CULTURE (INFECTION SUSPECTED)  CBC  COMPREHENSIVE METABOLIC PANEL WITH GFR  PROTIME-INR  ETHANOL  URINE DRUG SCREEN  LIPID PANEL  HEMOGLOBIN A1C  I-STAT CG4 LACTIC ACID, ED  I-STAT CG4 LACTIC ACID, ED  I-STAT CG4 LACTIC ACID, ED    EKG: None  Radiology:    Procedures   Medications Ordered in the ED  lactated ringers  infusion ( Intravenous New Bag/Given 12/05/24 1137)  clevidipine  (CLEVIPREX ) infusion 0.5 mg/mL (has no administration in time range)   stroke: early stages of recovery book (has no administration in time range)  acetaminophen  (TYLENOL ) tablet 650 mg (has no administration in time range)    Or  acetaminophen  (TYLENOL ) 160 MG/5ML solution 650 mg (has no administration in time range)    Or  acetaminophen  (TYLENOL ) suppository 650 mg (has no administration in time range)  senna-docusate (Senokot-S) tablet 1 tablet (1 tablet Oral Patient Refused/Not Given 12/05/24 1243)  pantoprazole  (PROTONIX ) injection 40 mg (has no  administration in time range)  labetalol  (NORMODYNE ) injection 10-20 mg (has no administration in time range)  hydrALAZINE  (APRESOLINE ) injection 10-20 mg (has no administration in time range)  acetaminophen  (TYLENOL ) tablet 650 mg (650 mg Oral Given 12/05/24 1116)  iohexol  (OMNIPAQUE ) 350 MG/ML injection 75 mL (75 mLs Intravenous Contrast Given 12/05/24 1054)  prothrombin  complex conc human (KCENTRA ) IVPB 3,314 Units (0 Units Intravenous Stopped 12/05/24 1200)  Medical Decision Making Amount and/or Complexity of Data Reviewed Independent Historian: EMS External Data Reviewed: notes. Labs: ordered. Decision-making details documented in ED Course. Radiology: ordered and independent interpretation performed. Decision-making details documented in ED Course. ECG/medicine tests: ordered and independent interpretation performed. Decision-making details documented in ED Course.  Risk OTC drugs. Prescription drug management. Decision regarding hospitalization.   Pt with multiple medical problems and comorbidities and presenting today with a complaint that caries a high risk for morbidity and mortality.  Here today with the above complaints.  Concern for intracranial bleed or stroke.  However patient was not himself yesterday and feel that he is outside the window for a code stroke.  Patient also has injury to his face.  He also feels warm on exam and concern for possible sepsis. Lab work and imaging initiated.  I have independently visualized and interpreted pt's images today.  Head CT shows evidence of large intraparenchymal bleed with some midline shift.  Discussed with radiology who also said that he may have a small subdural bleed and no obvious signs of aneurysm.  Chest x-ray within normal limits.  Also facial CT with nasal bone fracture.  No cervical fracture and C-spine was cleared. I independently interpreted patient's labs and lactic acid is normal, Chem-8  without acute findings.  Neurology was consulted and have reviewed the images.  Patient was started on Cleviprex  to keep blood pressure low.  All the findings were discussed with the patient's wife.  Patient will be admitted to the neuro ICU.  Spoke with Dr. Janjua with neuro interventional radiology.  He will also see the patient.  CRITICAL CARE Performed by: Tiarah Shisler Total critical care time: 40 minutes Critical care time was exclusive of separately billable procedures and treating other patients. Critical care was necessary to treat or prevent imminent or life-threatening deterioration. Critical care was time spent personally by me on the following activities: development of treatment plan with patient and/or surrogate as well as nursing, discussions with consultants, evaluation of patient's response to treatment, examination of patient, obtaining history from patient or surrogate, ordering and performing treatments and interventions, ordering and review of laboratory studies, ordering and review of radiographic studies, pulse oximetry and re-evaluation of patient's condition.        Final diagnoses:  Contusion of face, initial encounter  Closed fracture of nasal bone, initial encounter  Hx of spontan intraparenchymal intracran bleed assoc with coagulopathy    ED Discharge Orders     None          Doretha Folks, MD 12/05/24 1118    Doretha Folks, MD 12/05/24 1317  "

## 2024-12-05 NOTE — ED Notes (Signed)
 Trauma Response Nurse Documentation   Dwayne [name] is a 79 y.o. male arriving to [facility] ED via EMS  On Xarelto  (rivaroxaban ) daily. Trauma was activated as a Level 2 by ED Charge RN based on the following trauma criteria Elderly patients > 65 with head trauma on anti-coagulation (excluding ASA).  Patient cleared for CT by Dr. Doretha. Pt transported to CT with trauma response nurse present to monitor. RN remained with the patient throughout their absence from the department for clinical observation.   GCS 15.  History   Past Medical History:  Diagnosis Date   Arrhythmia    Bilateral pulmonary embolism (HCC) 05/28/2016   03/03/16   DVT of lower limb, acute (HCC)    left popliteal tibial and peroneal vein   Hypertension    Retroperitoneal bleed 12/25/2016   Cough x 1 week on Xarelto  extensive abdominal wall hematoma tracking down to penis & scrotum     Past Surgical History:  Procedure Laterality Date   INGUINAL HERNIA REPAIR Left 01/22/2022   Procedure: OPEN LEFT INGUINAL HERNIA REPAIR WITH MESH;  Surgeon: Vernetta Berg, MD;  Location: [facility];  Service: General;  Laterality: Left;   INGUINAL HERNIA REPAIR Right 01/20/2024   Procedure: open right HERNIA REPAIR INGUINAL ADULT;  Surgeon: Vernetta Berg, MD;  Location: [facility];  Service: General;  Laterality: Right;  LMA   KNEE SURGERY       Initial Focused Assessment (If applicable, or please see trauma documentation): Airway: Intact, patent Breathing: Breath sounds clear, equal bilaterally. SpO2 100% on RA. No CP or SOB. Circulation: Small abrasion to bridge of nose and to L eyebrow, bleeding all controlled and bandaids present.  Redness and bruising around L eye. SBP 120's upon arrival to ED. Disability: EMS C-collar in place, L side weaker and neglect present. Sensation to L side of body decreased. R gaze preference; L field cut. Mild aphasia and dysarthria present. PERRLA. NIHSS=8  upon my assessment.   CT's Completed:   CT Head, CT Maxillofacial, and CT C-Spine   Interventions:  -CXR -18G PIV to R AC -18G PIV to L FA -Labs drawn including cultures  -Pt undressed and assessed thoroughly  -Pt logrolled - back unremarkable  -rectal temp obtained: 100.1 -FAST deferred due to presentation and mechanism  -CT head, c-spine and max face -Kcentra  given to reverse xarelto   -Cleviprex  ordered if need be to keep SBP < 150.  -Neurology and neurosurgeon consulted while in CT - both of while spoke with family and pt and updated them on POC.  -LR gtt initiated  -tylenol  given  Plan for disposition:  Admission to ICU   Consults completed:  Neurosurgeon [name] consulted at 1053 [name] at bedside at 1101.  Event Summary: Pt was BIB [facility] from [facility] independent [facility]. Per pt's wife, pt is very sharp and independent at baseline including working out daily.  Wife reports that pt had been a little more tired yesterday than usual and slept a lot.  Pt also got up to use the restroom around 0100 this morning and fall, striking his head on the night stand. Per wife, pt seemed absolutely fine after this happened therefore pt got back in the bed and resumed sleeping.  Several hours later, pt attempted to get up again, falling and striking his head a second time and wedging himself between the nightstand and the bed.  No LOC. EMS was called at this time and they stated that staff  also reported that pt has been a little off for at about a day or so.  Upon arrival to ED, pt is alert and oriented, answering most questions appropriately but does seem to have some left sided neglect including a left field cut.  CT was obtained immediately an revealed an intraparenchymal hemorrhage seemingly non-traumatic.  Neurology was in CT with another patient at the same time, therefore they were consulted at this time. Neurosurgery was also consulted while in CT and immediately came to bedside  once back in the ED room. EDP, neurology and neurosurgery all spoke with pt and family about diagnosis and plan of care.  Plans to admit pt to 4NICU with 1hr neuro checks, f/u head CT in about 6 hrs and close parameters on blood pressure, etc. Kcentra  was given while in ED and flushed with NS.   Bedside handoff with ED RN Chloe.    Dwayne [name]  Trauma Response RN  Please call TRN at [phone] for further assistance.

## 2024-12-06 ENCOUNTER — Inpatient Hospital Stay (HOSPITAL_COMMUNITY)

## 2024-12-06 ENCOUNTER — Other Ambulatory Visit: Payer: Self-pay

## 2024-12-06 DIAGNOSIS — I619 Nontraumatic intracerebral hemorrhage, unspecified: Secondary | ICD-10-CM | POA: Diagnosis not present

## 2024-12-06 DIAGNOSIS — I351 Nonrheumatic aortic (valve) insufficiency: Secondary | ICD-10-CM | POA: Diagnosis not present

## 2024-12-06 LAB — ECHOCARDIOGRAM COMPLETE
AR max vel: 2.24 cm2
AV Area VTI: 2.33 cm2
AV Area mean vel: 2.26 cm2
AV Mean grad: 7.5 mmHg
AV Peak grad: 14.3 mmHg
Ao pk vel: 1.89 m/s
Area-P 1/2: 3.65 cm2
Calc EF: 63 %
Height: 70 in
S' Lateral: 3.2 cm
Single Plane A2C EF: 53.8 %
Single Plane A4C EF: 71.8 %
Weight: 2656 [oz_av]

## 2024-12-06 LAB — HEMOGLOBIN A1C
Hgb A1c MFr Bld: 5.6 % (ref 4.8–5.6)
Mean Plasma Glucose: 114.02 mg/dL

## 2024-12-06 LAB — SODIUM
Sodium: 141 mmol/L (ref 135–145)
Sodium: 144 mmol/L (ref 135–145)
Sodium: 147 mmol/L — ABNORMAL HIGH (ref 135–145)

## 2024-12-06 MED ORDER — SODIUM CHLORIDE 0.9% FLUSH
10.0000 mL | Freq: Two times a day (BID) | INTRAVENOUS | Status: DC
  Administered 2024-12-06 – 2024-12-08 (×5): 20 mL
  Administered 2024-12-09: 10 mL

## 2024-12-06 MED ORDER — ORAL CARE MOUTH RINSE: 15.0000 mL | OROMUCOSAL | Status: DC | PRN

## 2024-12-06 MED ORDER — CHLORHEXIDINE GLUCONATE CLOTH 2 % EX PADS
6.0000 | MEDICATED_PAD | Freq: Every day | CUTANEOUS | Status: DC
  Administered 2024-12-06 – 2024-12-09 (×4): 6 via TOPICAL

## 2024-12-06 MED ORDER — ORAL CARE MOUTH RINSE
15.0000 mL | OROMUCOSAL | Status: DC
  Administered 2024-12-06 – 2024-12-09 (×14): 15 mL via OROMUCOSAL

## 2024-12-06 MED ORDER — SODIUM CHLORIDE 0.9% FLUSH: 10.0000 mL | INTRAVENOUS | Status: DC | PRN

## 2024-12-06 NOTE — Progress Notes (Signed)
 STROKE TEAM PROGRESS NOTE    SIGNIFICANT HOSPITAL EVENTS 1/10- Admitted with ICH 1/11 - Worsening ICH and then stabilized. 3% started, PICC ordered   INTERIM HISTORY/SUBJECTIVE Seen in room with daughter and wife at the bedside. Drowsy this morning with left sided neglect. Repeat CT this afternoon stable. Family is discussing goals of care but for now want full code with aggressive management.  MRI brain w wo pending   OBJECTIVE  CBC    Component Value Date/Time   WBC 12.7 (H) 12/05/2024 1938   RBC 5.06 12/05/2024 1938   HGB 15.0 12/05/2024 1938   HGB 14.9 03/04/2018 0933   HCT 42.6 12/05/2024 1938   HCT 44.8 03/04/2018 0933   PLT 162 12/05/2024 1938   PLT 197 03/04/2018 0933   MCV 84.2 12/05/2024 1938   MCV 87 03/04/2018 0933   MCH 29.6 12/05/2024 1938   MCHC 35.2 12/05/2024 1938   RDW 12.6 12/05/2024 1938   RDW 13.6 03/04/2018 0933   LYMPHSABS 2.4 03/04/2018 0933   MONOABS 0.9 12/28/2016 1117   EOSABS 0.2 03/04/2018 0933   BASOSABS 0.0 03/04/2018 0933    BMET    Component Value Date/Time   NA 141 12/06/2024 0612   NA 136 03/04/2018 0933   K 4.4 12/05/2024 1938   CL 99 12/05/2024 1938   CO2 24 12/05/2024 1938   GLUCOSE 92 12/05/2024 1938   BUN 14 12/05/2024 1938   BUN 18 03/04/2018 0933   CREATININE 1.07 12/05/2024 1938   CREATININE 0.87 07/27/2015 1154   CALCIUM 9.2 12/05/2024 1938   GFRNONAA >60 12/05/2024 1938   Vitals:   12/06/24 0300 12/06/24 0400 12/06/24 0500 12/06/24 0600  BP: 139/71 (!) 149/62 (!) 145/68 (!) 148/67  Pulse: (!) 57 (!) 56 (!) 58 62  Resp: 20 19 19 20   Temp:  (!) 101.5 F (38.6 C)    TempSrc:  Axillary    SpO2: 93% 94% 94% 94%  Weight:      Height:         PHYSICAL EXAM General:  Well-nourished, well-developed patient in no acute distress Psych:  Mood and affect appropriate for situation CV: Regular rate and rhythm on monitor Respiratory:  Regular, unlabored respirations on room air GI: Abdomen soft and  nontender   NEURO:  Mental Status: Drowsy, responds to name  Speech/Language: Dysarthric, limited speech   Cranial Nerves:  II: PERRL. Visual fields full.  III, IV, VI: EOMI. Eyelids elevate symmetrically.  V: Sensation is intact to light touch and symmetrical to face.  VII: Face is symmetrical resting and smiling VIII: hearing intact to voice. IX, X: Palate elevates symmetrically. Phonation is normal.  KP:Dynloizm shrug 5/5. XII: tongue is midline without fasciculations. Motor:  RUE    LUE 3+/5 - left sided neglect- difficulty following commands in LUE and LLE RLE  LLE 4/5  Tone: is normal and bulk is normal Sensation- less response to noxious stimuli on the left, left neglect Coordination: FTN intact bilaterally, HKS: no ataxia in BLE.No drift.  Gait- deferred   ASSESSMENT/PLAN  [name] is a 78 y.o. 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  NIH on Admission 8  Intracerebral Hemorrhage:  right temporal hemorrhage  Etiology:  xarelto    CT Head - R temporal IPH with mod edema and 6mm shift  CTA head & neck 3mm focus enhancement in the posterior aspect of hemorrhage concerning for spot  sign. 40% stenosis of L ICA CT Head- Increased size of the right hemisphere intraparenchymal hematoma, now measuring 6.4 x 5.5 cm (previously 6.0 x 5.0 cm). Leftward midline shift increased to 11 mm (previously 7 mm). CT head- Large right hemispheric parenchymal hemorrhage (estimated volume 94 mL), stable from 2145 hours yesterday along with edema, mass effect, and leftward midline shift (11 mm on axials). Possible abnormal expansion of the Right caudate/basal ganglia along the medial margin. hemorrhagic tumor not excluded, MRI without and with contrast recommended when feasible. 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 Malcolm  Dysphagia Patient has post-stroke dysphagia, SLP consulted NPO Advance diet as tolerated  Leukocytosis WBC 12.7 Febrile 38.3 -> central fever Tylenol  @ 0900 UA negative, chest xray negative  Hospital day # 1  Patient seen and examined by NP/APP with MD. MD to update note as needed.   Jorene Last, DNP, FNP-BC [facility] Pager: [phone]   To contact Stroke Continuity provider, please refer to [email or web address]. After hours, contact General Neurology

## 2024-12-06 NOTE — Progress Notes (Signed)
 Peripherally Inserted Central Catheter Placement  The IV Nurse has discussed with the patient and/or persons authorized to consent for the patient, the purpose of this procedure and the potential benefits and risks involved with this procedure.  The benefits include less needle sticks, lab draws from the catheter, and the patient may be discharged home with the catheter. Risks include, but not limited to, infection, bleeding, blood clot (thrombus formation), and puncture of an artery; nerve damage and irregular heartbeat and possibility to perform a PICC exchange if needed/ordered by physician.  Alternatives to this procedure were also discussed.  Bard Power PICC patient education guide, fact sheet on infection prevention and patient information card has been provided to patient /or left at bedside. Obtained consent from wife [name].   PICC Placement Documentation  PICC Double Lumen 12/06/24 Right Basilic 38 cm 0 cm (Active)  Indication for Insertion or Continuance of Line Administration of hyperosmolar/irritating solutions (i.e. TPN, Vancomycin, etc.) 12/06/24 1700  Exposed Catheter (cm) 0 cm 12/06/24 1620  Site Assessment Clean, Dry, Intact 12/06/24 1700  Lumen #1 Status Infusing 12/06/24 1700  Lumen #2 Status In-line blood sampling system in place 12/06/24 1700  Dressing Type Transparent 12/06/24 1700  Dressing Status Antimicrobial disc/dressing in place 12/06/24 1700  Line Care Connections checked and tightened 12/06/24 1620  Line Adjustment (NICU/IV Team Only) No 12/06/24 1620  Dressing Intervention New dressing;Adhesive placed at insertion site (IV team only) 12/06/24 1620  Dressing Change Due 12/13/24 12/06/24 1700       Theressa Piedra Sheral Ruth 12/06/2024, 5:49 PM

## 2024-12-06 NOTE — TOC CAGE-AID Note (Signed)
 Transition of Care [facility]) - CAGE-AID Screening  Patient Details  Name: Dwayne [name] MRN: [id number] Date of Birth: 09/18/1946  Clinical Narrative:  Patient currently disoriented and unable to fully participate in screening. Per patients wife, patient rarely drinks alcohol and does not use any illicit substances. Substance abuse resources not provided at this time.  CAGE-AID Screening:    Have You Ever Felt You Ought to Cut Down on Your Drinking or Drug Use?: No Have People Annoyed You By Critizing Your Drinking Or Drug Use?: No Have You Felt Bad Or Guilty About Your Drinking Or Drug Use?: No Have You Ever Had a Drink or Used Drugs First Thing In The Morning to Steady Your Nerves or to Get Rid of a Hangover?: No CAGE-AID Score: 0  Substance Abuse Education Offered: No

## 2024-12-06 NOTE — Progress Notes (Signed)
 PT Cancellation Note  Patient Details Name: Dwayne [name] MRN: [id number] DOB: 01-07-1946   Cancelled Treatment:    Reason Eval/Treat Not Completed: (P) Active bedrest order;Patient not medically ready. X24 hour bedrest until 11:04 this AM. Coordinated with RN who reports pt has been very lethargic. Repeat CT scans have shown increased size of his right posterior temporal hemorrhage. Will plan to hold off on PT today unless indicated otherwise by MD.   Theo Ferretti, PT, DPT Acute Rehabilitation Services  Office: [phone]    Theo CHRISTELLA Ferretti 12/06/2024, 7:56 AM

## 2024-12-06 NOTE — Progress Notes (Signed)
 Patient somewhat more somnolent this morning.  Still will awaken and answer some simple questions but speech not as spontaneous or brisk.  Left upper extremity weakness persists.  Follow-up head CT scan demonstrates no interval worsening of his hemorrhage.  There is some increase in surrounding edema.  Mass effect is overall stable.  Patient with an unusual mixed density hemorrhage.  The overall geographic appearance seems more wedge-shaped and worrisome for infarction with secondary hemorrhages however neoplastic disease cannot be ruled out.  Recommend continued observational management hypertonic saline for now.

## 2024-12-06 NOTE — Evaluation (Signed)
 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/25/2016   Cough x 1 week on Xarelto  extensive abdominal wall hematoma tracking down to penis & scrotum   Past Surgical History:  Past Surgical History:  Procedure Laterality Date   INGUINAL HERNIA REPAIR Left 01/22/2022   Procedure: OPEN LEFT INGUINAL HERNIA REPAIR WITH MESH;  Surgeon: Vernetta Berg, MD;  Location: [facility];  Service: General;  Laterality: Left;   INGUINAL HERNIA REPAIR Right 01/20/2024   Procedure: open right HERNIA REPAIR INGUINAL ADULT;  Surgeon: Vernetta Berg, MD;  Location: [facility];  Service: General;  Laterality: Right;  LMA   KNEE SURGERY     HPI:  Dwayne [name] is a 79 y.o. male who presented to the ED from home on 12/05/24 after a day of him not feeling well, laying in bed all day and not eating, followed by two falls. He did not improve and so EMS was called. He presented as a code trauma but CTH revealed Large right hemispheric parenchymal hemorrhage with edema, mass effect and leftward midline shift and neurology was consulted. In addition, CTH showed possible abnormal expansion of right caudate/basal ganglia. MRI brain pending. He passed [facility] with nursing on 1/10 but had a decline in his mentation on 1/11 and was made NPO and SLP swallow evaluation was ordered. PMH: HTN, DVT of lower limb, arrythmia, bilateral PE 2017.    Assessment / Plan / Recommendation  Clinical Impression  SLP recommending continue NPO,  allow PRN ice chips and cup sips water . SLP will follow for PO readiness and to determine if need for objective swallow study.  Patient presents with clinical s/s of dysphagia as per this bedside swallow evaluation. He exhibited subtle, delayed cough with cup sips of water , no overt s/s with ice chips. He was alert with minimal intensity of cues but attention and awareness were both poor.  SLP Visit Diagnosis: Dysphagia, unspecified (R13.10)    Aspiration Risk  Moderate aspiration risk    Diet Recommendation NPO;Ice chips PRN after oral care;Free water  protocol after oral care    Liquid Administration via: Cup;No straw Medication Administration: Via alternative means Compensations: Slow rate;Small sips/bites Postural Changes: Seated upright at 90 degrees    Other Recommendations Oral Care Recommendations: Oral care QID;Oral care prior to ice chip/H20     Swallow Evaluation Recommendations     Assistance Recommended at Discharge    Functional Status Assessment Patient has had a recent decline in their functional status and demonstrates the ability to make significant improvements in function in a reasonable and predictable amount of time.  Frequency and Duration min 2x/week  2 weeks       Prognosis Prognosis for improved oropharyngeal function: Good Barriers to Reach Goals: Cognitive deficits      Swallow Study   General Date of Onset: 12/06/24 HPI: Dwayne [name] is a 79 y.o. male who presented to the ED from home on 12/05/24 after a day of him not feeling well, laying in bed all  day and not eating, followed by two falls. He did not improve and so EMS was called. He presented as a code trauma but CTH revealed Large right hemispheric parenchymal hemorrhage with edema, mass effect and leftward midline shift and neurology was consulted. In addition, CTH showed possible abnormal expansion of right caudate/basal ganglia. MRI brain pending. He passed [facility] with nursing on 1/10 but had a  decline in his mentation on 1/11 and was made NPO and SLP swallow evaluation was ordered. PMH: HTN, DVT of lower limb, arrythmia, bilateral PE 2017. Type of Study: Bedside Swallow Evaluation Previous Swallow Assessment: none found Diet Prior to this Study: NPO Temperature Spikes Noted: Yes (101) Respiratory Status: Room air History of Recent Intubation: No Behavior/Cognition: Alert;Requires cueing;Lethargic/Drowsy;Cooperative;Confused Oral Cavity Assessment: Dry Oral Care Completed by SLP: Yes Oral Cavity - Dentition: Adequate natural dentition Self-Feeding Abilities: Total assist Patient Positioning: Upright in bed Baseline Vocal Quality: Low vocal intensity Volitional Cough: Cognitively unable to elicit Volitional Swallow: Unable to elicit    Oral/Motor/Sensory Function Overall Oral Motor/Sensory Function: Generalized oral weakness   Ice Chips Ice chips: Within functional limits Presentation: Spoon   Thin Liquid Thin Liquid: Impaired Presentation: Cup;Spoon Oral Phase Impairments: Poor awareness of bolus Pharyngeal  Phase Impairments: Cough - Delayed    Nectar Thick Nectar Thick Liquid: Not tested   Honey Thick Honey Thick Liquid: Not tested   Puree Puree: Not tested   Solid     Solid: Not tested      Norleen IVAR Blase, MA, CCC-SLP Speech Therapy  12/06/2024,4:24 PM

## 2024-12-07 ENCOUNTER — Inpatient Hospital Stay (HOSPITAL_COMMUNITY)

## 2024-12-07 DIAGNOSIS — I69391 Dysphagia following cerebral infarction: Secondary | ICD-10-CM

## 2024-12-07 DIAGNOSIS — G936 Cerebral edema: Secondary | ICD-10-CM | POA: Diagnosis not present

## 2024-12-07 DIAGNOSIS — I611 Nontraumatic intracerebral hemorrhage in hemisphere, cortical: Secondary | ICD-10-CM | POA: Diagnosis not present

## 2024-12-07 DIAGNOSIS — T45515A Adverse effect of anticoagulants, initial encounter: Secondary | ICD-10-CM | POA: Diagnosis not present

## 2024-12-07 LAB — SODIUM
Sodium: 150 mmol/L — ABNORMAL HIGH (ref 135–145)
Sodium: 153 mmol/L — ABNORMAL HIGH (ref 135–145)
Sodium: 156 mmol/L — ABNORMAL HIGH (ref 135–145)
Sodium: 158 mmol/L — ABNORMAL HIGH (ref 135–145)

## 2024-12-07 LAB — MAGNESIUM: Magnesium: 2.1 mg/dL (ref 1.7–2.4)

## 2024-12-07 LAB — PHOSPHORUS: Phosphorus: 2.2 mg/dL — ABNORMAL LOW (ref 2.5–4.6)

## 2024-12-07 LAB — GLUCOSE, CAPILLARY: Glucose-Capillary: 154 mg/dL — ABNORMAL HIGH (ref 70–99)

## 2024-12-07 MED ORDER — THIAMINE MONONITRATE 100 MG PO TABS
100.0000 mg | ORAL_TABLET | Freq: Every day | ORAL | Status: DC
  Administered 2024-12-07 – 2024-12-09 (×3): 100 mg
  Filled 2024-12-07 (×3): qty 1

## 2024-12-07 MED ORDER — HYDRALAZINE HCL 20 MG/ML IJ SOLN
10.0000 mg | INTRAMUSCULAR | Status: DC | PRN
  Administered 2024-12-08 – 2024-12-09 (×2): 20 mg via INTRAVENOUS
  Filled 2024-12-07 (×2): qty 1

## 2024-12-07 MED ORDER — ONDANSETRON HCL 4 MG/2ML IJ SOLN
INTRAMUSCULAR | Status: AC
  Filled 2024-12-07: qty 2

## 2024-12-07 MED ORDER — LABETALOL HCL 5 MG/ML IV SOLN: 10.0000 mg | INTRAVENOUS | Status: DC | PRN

## 2024-12-07 MED ORDER — ENOXAPARIN SODIUM 40 MG/0.4ML IJ SOSY
40.0000 mg | PREFILLED_SYRINGE | INTRAMUSCULAR | Status: DC
  Administered 2024-12-07 – 2024-12-08 (×2): 40 mg via SUBCUTANEOUS
  Filled 2024-12-07 (×2): qty 0.4

## 2024-12-07 MED ORDER — PROSOURCE TF20 ENFIT COMPATIBL EN LIQD
60.0000 mL | Freq: Two times a day (BID) | ENTERAL | Status: DC
  Administered 2024-12-07 – 2024-12-09 (×5): 60 mL
  Filled 2024-12-07 (×5): qty 60

## 2024-12-07 MED ORDER — ADULT MULTIVITAMIN W/MINERALS CH
1.0000 | ORAL_TABLET | Freq: Every day | ORAL | Status: DC
  Administered 2024-12-07 – 2024-12-09 (×3): 1
  Filled 2024-12-07 (×3): qty 1

## 2024-12-07 MED ORDER — OSMOLITE 1.5 CAL PO LIQD
1000.0000 mL | ORAL | Status: DC
  Administered 2024-12-07 – 2024-12-09 (×3): 1000 mL
  Filled 2024-12-07: qty 1000

## 2024-12-07 MED ORDER — GADOBUTROL 1 MMOL/ML IV SOLN
8.0000 mL | Freq: Once | INTRAVENOUS | Status: AC | PRN
  Administered 2024-12-07: 8 mL via INTRAVENOUS

## 2024-12-07 MED ORDER — ONDANSETRON HCL 4 MG/2ML IJ SOLN
4.0000 mg | Freq: Four times a day (QID) | INTRAMUSCULAR | Status: DC | PRN
  Administered 2024-12-07: 4 mg via INTRAVENOUS

## 2024-12-07 NOTE — Progress Notes (Signed)
 STROKE TEAM PROGRESS NOTE    SIGNIFICANT HOSPITAL EVENTS 1/10- Admitted with ICH 1/11 - Worsening ICH and then stabilized. 3% started, PICC ordered   INTERIM HISTORY/SUBJECTIVE Seen in room with no family at the bedside. Drowsy this morning but follows commands well with persistent left sided neglect. Repeat CT yesterday afternoon stable.  Speech therapy with main feeding tube which will be done later today.  Blood pressure adequately controlled. MRI brain with contrast shows no underlying mass lesion OBJECTIVE  CBC    Component Value Date/Time   WBC 12.7 (H) 12/05/2024 1938   RBC 5.06 12/05/2024 1938   HGB 15.0 12/05/2024 1938   HGB 14.9 03/04/2018 0933   HCT 42.6 12/05/2024 1938   HCT 44.8 03/04/2018 0933   PLT 162 12/05/2024 1938   PLT 197 03/04/2018 0933   MCV 84.2 12/05/2024 1938   MCV 87 03/04/2018 0933   MCH 29.6 12/05/2024 1938   MCHC 35.2 12/05/2024 1938   RDW 12.6 12/05/2024 1938   RDW 13.6 03/04/2018 0933   LYMPHSABS 2.4 03/04/2018 0933   MONOABS 0.9 12/28/2016 1117   EOSABS 0.2 03/04/2018 0933   BASOSABS 0.0 03/04/2018 0933    BMET    Component Value Date/Time   NA 156 (H) 12/07/2024 1044   NA 136 03/04/2018 0933   K 4.4 12/05/2024 1938   CL 99 12/05/2024 1938   CO2 24 12/05/2024 1938   GLUCOSE 92 12/05/2024 1938   BUN 14 12/05/2024 1938   BUN 18 03/04/2018 0933   CREATININE 1.07 12/05/2024 1938   CREATININE 0.87 07/27/2015 1154   CALCIUM 9.2 12/05/2024 1938   GFRNONAA >60 12/05/2024 1938   Vitals:   12/07/24 1100 12/07/24 1159 12/07/24 1200 12/07/24 1300  BP: 128/62  138/60 (!) 138/59  Pulse: 66  68 65  Resp: (!) 22  (!) 25 (!) 24  Temp:  99.2 F (37.3 C)    TempSrc:  Axillary    SpO2: 90%  91% 93%  Weight:      Height:         PHYSICAL EXAM General:  Well-nourished, well-developed elderly Caucasian male patient in no acute distress Psych:  Mood and affect appropriate for situation CV: Regular rate and rhythm on monitor Respiratory:   Regular, unlabored respirations on room air GI: Abdomen soft and nontender   NEURO:  Mental Status: Drowsy, responds to name  Speech/Language: Dysarthric, limited speech   Cranial Nerves:  II: PERRL. Visual fields full.  III, IV, VI: EOMI. Eyelids elevate symmetrically.  V: Sensation is intact to light touch and symmetrical to face.  VII: Face is symmetrical resting and smiling VIII: hearing intact to voice. IX, X: Palate elevates symmetrically. Phonation is normal.  KP:Dynloizm shrug 5/5. XII: tongue is midline without fasciculations. Motor:  RUE    LUE 3+/5 - left sided neglect- difficulty following commands in LUE and LLE RLE  LLE 4/5  Tone: is normal and bulk is normal Sensation- less response to noxious stimuli on the left, left neglect Coordination: FTN intact bilaterally, HKS: no ataxia in BLE.No drift.  Gait- deferred   ASSESSMENT/PLAN  Dwayne [name] is a 79 y.o. 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  NIH on Admission 8  Intracerebral Hemorrhage:  right temporal hemorrhage  Etiology:  xarelto  related CT Head - R temporal IPH with mod edema and 6mm shift  CTA head & neck 3mm focus enhancement in  the posterior aspect of hemorrhage concerning for spot sign. 40% stenosis of L ICA CT Head- Increased size of the right hemisphere intraparenchymal hematoma, now measuring 6.4 x 5.5 cm (previously 6.0 x 5.0 cm). Leftward midline shift increased to 11 mm (previously 7 mm). CT head- Large right hemispheric parenchymal hemorrhage (estimated volume 94 mL), stable from 2145 hours yesterday along with edema, mass effect, and leftward midline shift (11 mm on axials). Possible abnormal expansion of the Right caudate/basal ganglia along the medial margin. hemorrhagic tumor not excluded, MRI without and with contrast recommended when feasible. MRI  brain w wo -no underlying mass  lesion. 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 Malcolm  Dysphagia Patient has post-stroke dysphagia, SLP consulted NPO Advance diet as tolerated  Leukocytosis WBC 12.7 Febrile 38.3 -> central fever Tylenol  @ 0900 UA negative, chest xray negative  Hospital day # 2  I have personally obtained history,examined this patient, reviewed notes, independently viewed imaging studies, participated in medical decision making and plan of care.ROS completed by me personally and pertinent positives fully documented  I have made any additions or clarifications directly to the above note. Agree with note above.  Patient neurological exam remains stable but still drowsiness and significant left hemiaplasia and neglect.  Continue strict blood pressure control with systolic goal below 160.  Hold anticoagulation.  Core track tube for feeding.  Mobilize out of bed.  Therapy consults.  No family available bedside for discussion.  Appreciate neurosurgery help. This patient is critically ill and at significant risk of neurological worsening, death and care requires constant monitoring of vital signs, hemodynamics,respiratory and cardiac monitoring, extensive review of multiple databases, frequent neurological assessment, discussion with family, other specialists and medical decision making of high complexity.I have made any additions or clarifications directly to the above note.This critical care time does not reflect procedure time, or teaching time or supervisory time of PA/NP/Med Resident etc but could involve care discussion time.  I spent 30 minutes  of neurocritical care time  in the care of  this patient.      Dwayne Popp, MD Medical Director [facility] Pager: [phone] 12/07/2024 2:55 PM    To contact Stroke Continuity provider, please refer to [email or web address]. After hours, contact General Neurology

## 2024-12-07 NOTE — Progress Notes (Signed)
 Inpatient Rehab Admissions Coordinator:   Per therapy recommendation,  patient was screened for CIR candidacy by Leita Kleine, MS, CCC-SLP. At this time, Pt. Appears to be a a potential candidate for CIR. I will place   order for rehab consult per protocol for full assessment. Please contact me any with questions.  Leita Kleine, MS, CCC-SLP Rehab Admissions Coordinator  [phone] (celll) [phone] (office)

## 2024-12-07 NOTE — Progress Notes (Signed)
 Speech Language Pathology Treatment: Dysphagia  Patient Details Name: Dwayne [name] MRN: [id number] DOB: 10/28/1946 Today's Date: 12/07/2024 Time: 8984-8976 SLP Time Calculation (min) (ACUTE ONLY): 8 min  Assessment / Plan / Recommendation Clinical Impression  Pt is more alert in comparison to previous date, though this fluctuates even within the timeframe of our visit. When fully alert initially, coughing was not noted after large, consecutive sips of water . As the session progressed, immediate coughing began occurring immediately after the swallow. He has limited awareness of R sided anterior loss and oral residue but can intermittently clear this when given cueing.   PLAN: RN reports fluctuating alertness has made PO med administration unreliable. Consider placement of short-term enteral nutrition with SLP f/u to assess ability to initiate diet vs need for instrumental testing. His mentation is currently a barrier to proceeding with either. Continue to offer ice chips and sips of water  with frequent oral care when fully alert.    HPI HPI: 79 yo male presenting to ED 1/10 with headache and lethargy x24 hours as well as progressive confusion and L sided weakness s/p falls x2. CTH showed large acute R temporal parenchymal hemorrhage with moderate edema and leftward midline shift, increasing in size on repeat imaging. 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 Pt initially passed the [facility] 1/10 but SLP was consulted 1/11 due to change in mentation. PMH includes DVT, PE on anticoagulation, HTN, retroperitoneal bleed, arrhythmia      SLP Plan  Continue with current plan of care        Swallow Evaluation Recommendations   Recommendations: NPO except meds;Ice chips PRN after oral care Medication Administration: Crushed with puree Oral care recommendations: Oral care QID (4x/day);Oral care before ice chips/water      Recommendations                      Oral care QID;Oral care prior to ice chip/H20   Frequent or constant Supervision/Assistance Dysphagia, unspecified (R13.10)     Continue with current plan of care     Damien Blumenthal, M.A., CCC-SLP Speech Language Pathology, Acute Rehabilitation Services  Secure Chat preferred [phone]   12/07/2024, 10:53 AM

## 2024-12-07 NOTE — Procedures (Signed)
 Cortrak  Person Inserting Tube:  Nija Koopman, Olivia SAUNDERS, RD Tube Type:  Cortrak - 43 inches Tube Size:  10 Tube Location:  Left nare Secured by: Bridle Initial Placement:  Gastric Technique Used to Measure Tube Placement:  Marking at nare/corner of mouth Cortrak Secured At:  66 cm Initial Placement Verification:  Cortrak device (Registered Dieticians Only)  Cortrak Tube Team Note:  Consult received to place a Cortrak feeding tube.   No x-ray is required. RN may begin using tube.   If the tube becomes dislodged please keep the tube and contact the Cortrak team at [email or web address] for replacement.  If after hours and replacement cannot be delayed, place a NG tube and confirm placement with an abdominal x-ray.    Olivia Kenning, RD Registered Dietitian  See Amion for more information

## 2024-12-07 NOTE — TOC CM/SW Note (Signed)
 Transition of Care [facility]) - Inpatient Brief Assessment   Patient Details  Name: Dwayne [name] MRN: [id number] Date of Birth: 1946-01-20  Transition of Care [facility]) CM/SW Contact:    Tippi Mccrae M, RN Phone Number: 12/07/2024, 3:43 PM   Clinical Narrative: Pt is a 79 y.o. male who presented 12/05/24 with headache and lethargy x24 hours, x2 falls, and progressive confusion and L-sided weakness. CT head showed large acute right temporal parenchymal hemorrhage with moderate edema and leftward midline shift, increasing in size on repeat imaging.  PT/OT and ST recommending CIR and consult requested.  Will follow as patient progresses.    Transition of Care Asessment: Insurance and Status: Insurance coverage has been reviewed Patient has primary care physician: Yes Raechel Sauers) Home environment has been reviewed: From [facility]; lives with spouse Prior level of function:: Independent Prior/Current Home Services: No current home services Social Drivers of Health Review: SDOH reviewed no interventions necessary Readmission risk has been reviewed: Yes Transition of care needs: no transition of care needs at this time   Mliss MICAEL Fass, RN, BSN  Trauma/Neuro ICU Case Manager [phone]

## 2024-12-07 NOTE — Evaluation (Signed)
 Physical Therapy Evaluation Patient Details Name: Dwayne [name] MRN: [id number] DOB: Apr 04, 1946 Today's Date: 12/07/2024  History of Present Illness  Pt is a 79 y.o. male who presented 12/05/24 with headache and lethargy x24 hours, x2 falls, and progressive confusion and L-sided weakness. CT head showed large acute right temporal parenchymal hemorrhage with moderate edema and leftward midline shift, increasing in size on repeat imaging. 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 PMH: DVT, PE on anticoagulation, HTN, retroperitoneal bleed, arrythmia   Clinical Impression  Pt admitted with above. PTA pt lived with spouse and was indep without AD, drove, and management medications. Pt and spouse live in ILF apartment at [facility]. Pt now presenting with L sided weakness, significant L UE and LE sensory deficit, L neglect, inability to track with eyes pass midline despite max verbal cues and tactile cues to provide L cervical rotation., impaired balance, and requires assistx2 for mobility. Pt to greatly benefit from intensive inpatient rehab program > 3 hrs a day to address above deficits and achieve safe level of function to return home with spouse. Acute PT to cont to follow.        If plan is discharge home, recommend the following: A lot of help with walking and/or transfers;A little help with bathing/dressing/bathroom;Assist for transportation;Supervision due to cognitive status;Assistance with feeding;Assistance with cooking/housework;Direct supervision/assist for medications management;Direct supervision/assist for financial management   Can travel by private vehicle        Equipment Recommendations Other (comment) (TBD at next venue)  Recommendations for Other Services  Rehab consult    Functional Status Assessment Patient has had a recent decline in their functional status and demonstrates the ability to make significant improvements in function in a reasonable and predictable  amount of time.     Precautions / Restrictions Precautions Precautions: Fall Precaution/Restrictions Comments: L inattention, doesnt cross midline with tracking of eyes Restrictions Weight Bearing Restrictions Per Provider Order: No      Mobility  Bed Mobility Overal bed mobility: Needs Assistance Bed Mobility: Rolling, Sidelying to Sit, Sit to Supine Rolling: Max assist, Used rails (to the L) Sidelying to sit: Max assist, +2 for physical assistance, HOB elevated, Used rails   Sit to supine: Max assist, +2 for physical assistance   General bed mobility comments: pt with L neglect requiring max directional verbal and tactile cues, pt unable to initiate transfer    Transfers Overall transfer level: Needs assistance Equipment used: 2 person hand held assist (face to face transfer with bed pad) Transfers: Sit to/from Stand Sit to Stand: Mod assist, +2 physical assistance           General transfer comment: modAx2 to power up, L knee blocked, completed 2 sit to stands    Ambulation/Gait               General Gait Details: unable this date  Stairs            Wheelchair Mobility     Tilt Bed    Modified Rankin (Stroke Patients Only) Modified Rankin (Stroke Patients Only) Pre-Morbid Rankin Score: No significant disability Modified Rankin: Moderately severe disability     Balance Overall balance assessment: Needs assistance Sitting-balance support: Feet supported, Single extremity supported Sitting balance-Leahy Scale: Poor Sitting balance - Comments: pushing with R UE to the L, L lateral lean Postural control: Left lateral lean Standing balance support: During functional activity, Reliant on assistive device for balance, No upper extremity supported Standing balance-Leahy Scale:  Poor Standing balance comment: dependent on external assist                             Pertinent Vitals/Pain Pain Assessment Pain Assessment: Faces Faces  Pain Scale: Hurts little more Pain Location: neck, restless sitting EOB reporting Its just uncomfortable    Home Living Family/patient expects to be discharged to:: Private residence Living Arrangements: Spouse/significant other Available Help at Discharge: Family Type of Home: Independent living facility Home Access: Level entry       Home Layout: One level Home Equipment: [place] - single point Additional Comments: lives with wife in indep apartment at [employer]    Prior Function Prior Level of Function : Independent/Modified Independent             Mobility Comments: no AD, drives ADLs Comments: indep, manages medication     Extremity/Trunk Assessment   Upper Extremity Assessment Upper Extremity Assessment: Defer to OT evaluation    Lower Extremity Assessment Lower Extremity Assessment: LLE deficits/detail LLE Deficits / Details: grossly 2+/5, able to stand with support of L knee blocking to prevent buckling    Cervical / Trunk Assessment Cervical / Trunk Assessment: Kyphotic  Communication   Communication Communication: Impaired Factors Affecting Communication: Reduced clarity of speech (delayed response)    Cognition Arousal: Alert (but keeps eyes closed) Behavior During Therapy: Flat affect   PT - Cognitive impairments: Orientation, Awareness, Memory, Attention, Initiation, Sequencing, Problem solving, Safety/Judgement   Orientation impairments: Place, Time, Situation (repatedly stating he's in the bathroom)                   PT - Cognition Comments: no carry over once re-oriented to hospital and situation, L inattention, difficulty sequencing/initiating task, diffiuclty keeping eyes open t/o session Following commands: Impaired Following commands impaired: Follows one step commands with increased time (with R side more consistently than L)     Cueing Cueing Techniques: Verbal cues, Tactile cues, Visual cues     General Comments General  comments (skin integrity, edema, etc.): L eye bruising and swelling    Exercises     Assessment/Plan    PT Assessment Patient needs continued PT services  PT Problem List Decreased strength;Decreased range of motion;Decreased activity tolerance;Decreased balance;Decreased mobility;Decreased coordination;Decreased cognition       PT Treatment Interventions DME instruction;Gait training;Functional mobility training;Stair training;Therapeutic activities;Therapeutic exercise;Balance training;Neuromuscular re-education    PT Goals (Current goals can be found in the Care Plan section)  Acute Rehab PT Goals Patient Stated Goal: didn't state PT Goal Formulation: With patient/family Time For Goal Achievement: 12/21/24 Potential to Achieve Goals: Good    Frequency Min 4X/week     Co-evaluation               AM-PAC PT 6 Clicks Mobility  Outcome Measure Help needed turning from your back to your side while in a flat bed without using bedrails?: A Lot Help needed moving from lying on your back to sitting on the side of a flat bed without using bedrails?: A Lot Help needed moving to and from a bed to a chair (including a wheelchair)?: A Lot Help needed standing up from a chair using your arms (e.g., wheelchair or bedside chair)?: A Lot Help needed to walk in hospital room?: Total Help needed climbing 3-5 steps with a railing? : Total 6 Click Score: 10    End of Session Equipment Utilized During Treatment: Gait belt Activity Tolerance: Patient  tolerated treatment well Patient left: in bed;with call bell/phone within reach;with bed alarm set;with family/visitor present Nurse Communication: Mobility status PT Visit Diagnosis: Unsteadiness on feet (R26.81);Other abnormalities of gait and mobility (R26.89);Muscle weakness (generalized) (M62.81);History of falling (Z91.81)    Time: 9193-9162 PT Time Calculation (min) (ACUTE ONLY): 31 min   Charges:   PT Evaluation $PT Eval  Moderate Complexity: 1 Mod   PT General Charges $$ ACUTE PT VISIT: 1 Visit         Norene Ames, PT, DPT Acute Rehabilitation Services Secure chat preferred Office #: [phone]   Norene CHRISTELLA Ames 12/07/2024, 10:07 AM

## 2024-12-07 NOTE — Plan of Care (Signed)
 Notified of intermittent bradycardia to 30s. Exam unchanged. 12 lead with bradycardia in 50s and PVCs. Will check AM labs. BP stable.  Will repeat CTH in AM.  [name] Lav, MD Neurology

## 2024-12-07 NOTE — Progress Notes (Signed)
 Looks better today.  Much more awake and interactive.  Oriented to person and knows that he is in the hospital.  Working with speech therapy this morning.  He is awake and alert.  He is oriented x 1.  His speech is reasonably clear.  Still with a rather dense left-sided hemiparesis.  Right-sided strength normal.  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  No evidence of neoplastic or vascular disease.  Mass effect rather minimal given the size of his hemorrhage.  No new recommendations from our standpoint.  I will continue to follow.

## 2024-12-07 NOTE — Evaluation (Signed)
 Occupational Therapy Evaluation Patient Details Name: Dwayne [name] MRN: [id number] DOB: 12-Nov-1946 Today's Date: 12/07/2024   History of Present Illness   Pt is a 79 y.o. male who presented 12/05/24 with headache and lethargy x24 hours, x2 falls, and progressive confusion and L-sided weakness. CT head showed large acute right temporal parenchymal hemorrhage with moderate edema and leftward midline shift, increasing in size on repeat imaging. 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 PMH: DVT, PE on anticoagulation, HTN, retroperitoneal bleed, arrythmia     Clinical Impressions Dwayne [name] was evaluated s/p the above admission list. He lives at an ILF with his wife, drives and is indep at baseline. Upon evaluation the pt was limited by L weakness, R gaze, L inattention, communication, cognition, balance and activity tolerance. Overall he needed max A +2 fro bed mobility, mod A +2 to stand but was unable to progress to stepping. Due to the deficits listed below the pt also needs total A fro LB ADLs and max A for UB ADLs. Pt will benefit from continued acute OT services and intensive inpatient follow up therapy, >3 hours/day after discharge.       If plan is discharge home, recommend the following:   A lot of help with walking and/or transfers;A lot of help with bathing/dressing/bathroom;Assistance with cooking/housework;Assistance with feeding;Direct supervision/assist for medications management;Direct supervision/assist for financial management;Assist for transportation;Help with stairs or ramp for entrance;Supervision due to cognitive status     Functional Status Assessment   Patient has had a recent decline in their functional status and demonstrates the ability to make significant improvements in function in a reasonable and predictable amount of time.     Equipment Recommendations   Other (comment) (defer)     Recommendations for Other Services   Rehab consult      Precautions/Restrictions   Precautions Precautions: Fall Precaution/Restrictions Comments: L inattention, doesnt cross midline with tracking of eyes Restrictions Weight Bearing Restrictions Per Provider Order: No     Mobility Bed Mobility Overal bed mobility: Needs Assistance Bed Mobility: Rolling, Sidelying to Sit, Sit to Supine Rolling: Max assist, Used rails (to the L) Sidelying to sit: Max assist, +2 for physical assistance, HOB elevated, Used rails   Sit to supine: Max assist, +2 for physical assistance   General bed mobility comments: pt with L neglect requiring max directional verbal and tactile cues, pt unable to initiate transfer    Transfers Overall transfer level: Needs assistance Equipment used: 2 person hand held assist (face to face transfer with bed pad) Transfers: Sit to/from Stand Sit to Stand: Mod assist, +2 physical assistance           General transfer comment: modAx2 to power up, L knee blocked, completed 2 sit to stands      Balance Overall balance assessment: Needs assistance Sitting-balance support: Feet supported, Single extremity supported Sitting balance-Leahy Scale: Poor Sitting balance - Comments: pushing with R UE to the L, L lateral lean Postural control: Left lateral lean Standing balance support: During functional activity, Reliant on assistive device for balance, No upper extremity supported Standing balance-Leahy Scale: Poor Standing balance comment: dependent on external assist                           ADL either performed or assessed with clinical judgement   ADL Overall ADL's : Needs assistance/impaired Eating/Feeding: NPO   Grooming: Maximal assistance   Upper Body Bathing: Maximal assistance   Lower  Body Bathing: Total assistance   Upper Body Dressing : Maximal assistance   Lower Body Dressing: Total assistance   Toilet Transfer: Total assistance   Toileting- Clothing Manipulation and Hygiene: Total  assistance       Functional mobility during ADLs: Maximal assistance;+2 for safety/equipment;+2 for physical assistance General ADL Comments: limited by L weakness, R gaze, L inattention, poor balance, limited command following - no carryover of commands and no awareness of midline     Vision Baseline Vision/History: 1 Wears glasses Vision Assessment?: Vision impaired- to be further tested in functional context Additional Comments: R gaze, able to track to midline, eyes do no cross to L despite maximal efffort and head turn.     Perception Perception: Impaired Preception Impairment Details: Inattention/Neglect     Praxis Praxis: Not tested       Pertinent Vitals/Pain Pain Assessment Faces Pain Scale: Hurts little more Pain Location: neck, restless sitting EOB reporting Its just uncomfortable     Extremity/Trunk Assessment Upper Extremity Assessment Upper Extremity Assessment: LUE deficits/detail LUE Deficits / Details: 1/5 MMT digit flexion/ext, 1/5 elbow flexion - otherwise not activation noted. no functional use of L. responds to pain LUE Sensation: decreased light touch;decreased proprioception LUE Coordination: decreased fine motor;decreased gross motor   Lower Extremity Assessment Lower Extremity Assessment: Defer to PT evaluation LLE Deficits / Details: grossly 2+/5, able to stand with support of L knee blocking to prevent buckling   Cervical / Trunk Assessment Cervical / Trunk Assessment: Kyphotic   Communication Communication Communication: Impaired Factors Affecting Communication: Reduced clarity of speech (delayed response)   Cognition Arousal: Alert (but keeps eyes closed) Behavior During Therapy: Flat affect                                 Following commands: Impaired Following commands impaired: Follows one step commands with increased time (with R side more consistently than L)     Cueing  General Comments   Cueing Techniques:  Verbal cues;Tactile cues;Visual cues  VSS, dtr present and supportive           Home Living Family/patient expects to be discharged to:: Private residence Living Arrangements: Spouse/significant other Available Help at Discharge: Family Type of Home: Independent living facility Home Access: Level entry     Home Layout: One level     Bathroom Shower/Tub: Producer, Television/film/video: Handicapped height     Home Equipment: [employer] - single point   Additional Comments: lives with wife in indep apartment at [employer] With: Spouse    Prior Functioning/Environment Prior Level of Function : Independent/Modified Independent             Mobility Comments: no AD, drives ADLs Comments: indep, manages medication    OT Problem List: Decreased strength;Decreased range of motion;Decreased activity tolerance;Impaired balance (sitting and/or standing);Decreased cognition;Decreased safety awareness;Decreased knowledge of use of DME or AE;Decreased knowledge of precautions;Pain   OT Treatment/Interventions: Self-care/ADL training;Therapeutic exercise;DME and/or AE instruction;Therapeutic activities;Balance training;Patient/family education;Cognitive remediation/compensation      OT Goals(Current goals can be found in the care plan section)   Acute Rehab OT Goals Patient Stated Goal: unable to state OT Goal Formulation: With patient Time For Goal Achievement: 12/21/24 Potential to Achieve Goals: Good ADL Goals Pt Will Perform Grooming: with contact guard assist;sitting Pt Will Perform Upper Body Dressing: sitting;with contact guard assist Additional ADL Goal #1: pt will visuall attend to the L  environment with mod cues during a functional task Additional ADL Goal #2: pt will use his LUE as a functional assist with mod A during ADL task   OT Frequency:  Min 2X/week    Co-evaluation PT/OT/SLP Co-Evaluation/Treatment: Yes Reason for Co-Treatment: Complexity of the  patient's impairments (multi-system involvement);For patient/therapist safety;To address functional/ADL transfers   OT goals addressed during session: ADL's and self-care      AM-PAC OT 6 Clicks Daily Activity     Outcome Measure Help from another person eating meals?: Total Help from another person taking care of personal grooming?: A Lot Help from another person toileting, which includes using toliet, bedpan, or urinal?: Total Help from another person bathing (including washing, rinsing, drying)?: A Lot Help from another person to put on and taking off regular upper body clothing?: A Lot Help from another person to put on and taking off regular lower body clothing?: Total 6 Click Score: 9   End of Session Equipment Utilized During Treatment: Gait belt Nurse Communication: Mobility status  Activity Tolerance: Patient tolerated treatment well Patient left: in bed;with call bell/phone within reach;with bed alarm set  OT Visit Diagnosis: Unsteadiness on feet (R26.81);Other abnormalities of gait and mobility (R26.89);Muscle weakness (generalized) (M62.81);Hemiplegia and hemiparesis;Pain;Cognitive communication deficit (R41.841) Symptoms and signs involving cognitive functions: Cerebral infarction Hemiplegia - Right/Left: Left Hemiplegia - dominant/non-dominant: Non-Dominant Hemiplegia - caused by: Cerebral infarction                Time: 9193-9163 OT Time Calculation (min): 30 min Charges:  OT General Charges $OT Visit: 1 Visit OT Evaluation $OT Eval Moderate Complexity: 1 Mod  Lucie Kendall, OTR/L Acute Rehabilitation Services Office [phone] Secure Chat Communication Preferred   Lucie JONETTA Kendall 12/07/2024, 11:06 AM

## 2024-12-07 NOTE — Evaluation (Signed)
 Speech Language Pathology Evaluation Patient Details Name: Dwayne [name] MRN: [id number] DOB: 08/13/1946 Today's Date: 12/07/2024 Time: 8976-8967 SLP Time Calculation (min) (ACUTE ONLY): 9 min  Problem List:  Patient Active Problem List   Diagnosis Date Noted   ICH (intracerebral hemorrhage) (HCC) 12/05/2024   Retroperitoneal bleed 12/25/2016   Bilateral pulmonary embolism (HCC) 05/28/2016   DVT of lower limb, acute (HCC) 07/27/2015   Past Medical History:  Past Medical History:  Diagnosis Date   Arrhythmia    Bilateral pulmonary embolism (HCC) 05/28/2016   03/03/16   DVT of lower limb, acute (HCC)    left popliteal tibial and peroneal vein   Hypertension    Retroperitoneal bleed 12/25/2016   Cough x 1 week on Xarelto  extensive abdominal wall hematoma tracking down to penis & scrotum   Past Surgical History:  Past Surgical History:  Procedure Laterality Date   INGUINAL HERNIA REPAIR Left 01/22/2022   Procedure: OPEN LEFT INGUINAL HERNIA REPAIR WITH MESH;  Surgeon: Vernetta Berg, MD;  Location: [facility];  Service: General;  Laterality: Left;   INGUINAL HERNIA REPAIR Right 01/20/2024   Procedure: open right HERNIA REPAIR INGUINAL ADULT;  Surgeon: Vernetta Berg, MD;  Location: [facility];  Service: General;  Laterality: Right;  LMA   KNEE SURGERY     HPI:  79 yo male presenting to ED 1/10 with headache and lethargy x24 hours as well as progressive confusion and L sided weakness s/p falls x2. CTH showed large acute R temporal parenchymal hemorrhage with moderate edema and leftward midline shift, increasing in size on repeat imaging. 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 Pt initially passed the [facility] 1/10 but SLP was consulted 1/11 due to change in mentation. PMH includes DVT, PE on anticoagulation, HTN, retroperitoneal bleed, arrhythmia   Assessment / Plan / Recommendation Clinical Impression  At  baseline, pt lives with his spouse in an ILF but is generally independent. During structured tasks, he demonstrates functional attention and memory but this does not carry over into functional tasks. His eyes generally remain closed and when open, he cannot track to the L of midline given Max cueing. He has difficulty sustaining attention to functional tasks and has limited awareness of CLOF or situation. Suspect performance may also be affected by fluctuating alertness. Given independence PTA, intensive SLP f/u is recommended.     SLP Assessment  SLP Recommendation/Assessment: Patient needs continued Speech Language Pathology Services SLP Visit Diagnosis: Cognitive communication deficit (R41.841)     Assistance Recommended at Discharge  Frequent or constant Supervision/Assistance  Functional Status Assessment Patient has had a recent decline in their functional status and demonstrates the ability to make significant improvements in function in a reasonable and predictable amount of time.  Frequency and Duration min 2x/week  2 weeks      SLP Evaluation Cognition  Overall Cognitive Status: Impaired/Different from baseline Arousal/Alertness: Awake/alert Orientation Level: Oriented to person;Oriented to place;Oriented to time;Disoriented to situation Attention: Sustained Sustained Attention: Impaired Sustained Attention Impairment: Functional basic Memory: Impaired Memory Impairment: Decreased recall of new information Awareness: Impaired Awareness Impairment: Intellectual impairment Problem Solving: Impaired Problem Solving Impairment: Functional basic       Comprehension  Auditory Comprehension Overall Auditory Comprehension: Appears within functional limits for tasks assessed    Expression Expression Primary Mode of Expression: Verbal Verbal Expression Overall Verbal Expression: Appears within functional limits for tasks assessed   Oral / Motor  Oral Motor/Sensory  Function Overall Oral Motor/Sensory Function: Mild impairment Facial ROM: Reduced left;Suspected CN VII (facial) dysfunction Facial Symmetry: Abnormal symmetry left;Suspected CN VII (facial) dysfunction Facial Strength: Reduced left;Suspected CN VII (facial) dysfunction Facial Sensation: Within Functional Limits Lingual ROM: Within Functional Limits Lingual Symmetry: Within Functional Limits Lingual Strength: Within Functional Limits Lingual Sensation: Within Functional Limits Motor Speech Overall Motor Speech: Appears within functional limits for tasks assessed            Damien Blumenthal, M.A., CCC-SLP Speech Language Pathology, Acute Rehabilitation Services  Secure Chat preferred [phone]  12/07/2024, 11:06 AM

## 2024-12-07 NOTE — Progress Notes (Signed)
 Initial Nutrition Assessment  DOCUMENTATION CODES:   Non-severe (moderate) malnutrition in context of chronic illness  INTERVENTION:   Initiate tube feeding via Cortrak tube: Osmolite 1.5 at 20 ml/h and increase by 10 ml every 8 hours to goal rate of 50 ml/hr (1200 ml per day)  Prosource TF20 60 ml BID  Provides 1960 kcal, 115 gm protein, 912 ml free water  daily  Add MVI with minerals   Pt is at risk for refeeding syndrome given meets criteria for malnutrition. Monitor magnesium and phosphorus daily x 4 occurrences or until WNL, MD to replete as needed.  100 mg thiamine  daily x 7 days     NUTRITION DIAGNOSIS:   Moderate Malnutrition related to chronic illness as evidenced by mild fat depletion, mild muscle depletion.  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   Spoke with family who reports that pt typically eats well. They eat Breakfast and Lunch in their own apartment at [facility] and dinner in the cafeteria.  Pt is awake and alert during cortrak placement.    1/10 - admitted with ICH 1/11 - worsening ICH, started on 3% 1/12 - s/p cortrak placement; tip gastric     Medications reviewed and include: protonix , senokot-s Cleviprex  Hypertonic saline  Labs reviewed:  Na 153     NUTRITION - FOCUSED PHYSICAL EXAM:  Flowsheet Row Most Recent Value  Orbital Region No depletion  Upper Arm Region Mild depletion  Thoracic and Lumbar Region No depletion  Buccal Region Mild depletion  Temple Region Moderate depletion  Clavicle Bone Region Mild depletion  Clavicle and Acromion Bone Region Moderate depletion  Scapular Bone Region Unable to assess  Dorsal Hand No depletion  Patellar  Region Moderate depletion  Anterior Thigh Region Moderate depletion  Posterior Calf Region Mild depletion  Edema (RD Assessment) None  Hair Reviewed  Eyes Reviewed  Mouth Reviewed  Skin Reviewed  Nails Reviewed    Diet Order:   Diet Order             Diet NPO time specified  Diet effective now                   EDUCATION NEEDS:   Education needs have been addressed  Skin:  Skin Assessment: Reviewed RN Assessment  Last BM:  unknown  Height:   Ht Readings from Last 1 Encounters:  12/05/24 5' 10 (1.778 m)    Weight:   Wt Readings from Last 1 Encounters:  12/05/24 75.3 kg    BMI:  Body mass index is 23.82 kg/m.  Estimated Nutritional Needs:   Kcal:  1800-2000  Protein:  90-115 grams  Fluid:  >1.8 L/day  Powell SQUIBB., RD, LDN, CNSC See AMiON for contact information

## 2024-12-08 DIAGNOSIS — I69391 Dysphagia following cerebral infarction: Secondary | ICD-10-CM | POA: Diagnosis not present

## 2024-12-08 DIAGNOSIS — T45515A Adverse effect of anticoagulants, initial encounter: Secondary | ICD-10-CM | POA: Diagnosis not present

## 2024-12-08 DIAGNOSIS — G936 Cerebral edema: Secondary | ICD-10-CM | POA: Diagnosis not present

## 2024-12-08 DIAGNOSIS — E440 Moderate protein-calorie malnutrition: Secondary | ICD-10-CM | POA: Insufficient documentation

## 2024-12-08 DIAGNOSIS — I611 Nontraumatic intracerebral hemorrhage in hemisphere, cortical: Secondary | ICD-10-CM | POA: Diagnosis not present

## 2024-12-08 LAB — BASIC METABOLIC PANEL WITH GFR
Anion gap: 12 (ref 5–15)
BUN: 38 mg/dL — ABNORMAL HIGH (ref 8–23)
CO2: 21 mmol/L — ABNORMAL LOW (ref 22–32)
Calcium: 8.4 mg/dL — ABNORMAL LOW (ref 8.9–10.3)
Chloride: 127 mmol/L — ABNORMAL HIGH (ref 98–111)
Creatinine, Ser: 1.07 mg/dL (ref 0.61–1.24)
GFR, Estimated: >60 mL/min
Glucose, Bld: 139 mg/dL — ABNORMAL HIGH (ref 70–99)
Potassium: 3.4 mmol/L — ABNORMAL LOW (ref 3.5–5.1)
Sodium: 160 mmol/L — ABNORMAL HIGH (ref 135–145)

## 2024-12-08 LAB — GLUCOSE, CAPILLARY
Glucose-Capillary: 149 mg/dL — ABNORMAL HIGH (ref 70–99)
Glucose-Capillary: 136 mg/dL — ABNORMAL HIGH (ref 70–99)
Glucose-Capillary: 139 mg/dL — ABNORMAL HIGH (ref 70–99)
Glucose-Capillary: 131 mg/dL — ABNORMAL HIGH (ref 70–99)
Glucose-Capillary: 133 mg/dL — ABNORMAL HIGH (ref 70–99)

## 2024-12-08 LAB — PHOSPHORUS: Phosphorus: 2.4 mg/dL — ABNORMAL LOW (ref 2.5–4.6)

## 2024-12-08 LAB — SODIUM
Sodium: 159 mmol/L — ABNORMAL HIGH (ref 135–145)
Sodium: 157 mmol/L — ABNORMAL HIGH (ref 135–145)
Sodium: 156 mmol/L — ABNORMAL HIGH (ref 135–145)

## 2024-12-08 LAB — MAGNESIUM: Magnesium: 2.3 mg/dL (ref 1.7–2.4)

## 2024-12-08 MED ORDER — POTASSIUM & SODIUM PHOSPHATES 280-160-250 MG PO PACK: 2.0000 | PACK | ORAL | Status: DC

## 2024-12-08 MED ORDER — POTASSIUM & SODIUM PHOSPHATES 280-160-250 MG PO PACK
2.0000 | PACK | ORAL | Status: AC
  Administered 2024-12-08 (×2): 2
  Filled 2024-12-08 (×2): qty 2

## 2024-12-08 MED ORDER — CARVEDILOL 3.125 MG PO TABS
3.1250 mg | ORAL_TABLET | Freq: Two times a day (BID) | ORAL | Status: DC
  Administered 2024-12-08 – 2024-12-09 (×4): 3.125 mg
  Filled 2024-12-08 (×4): qty 1

## 2024-12-08 MED ORDER — LORAZEPAM 1 MG PO TABS: 2.0000 mg | ORAL_TABLET | Freq: Once | ORAL | Status: DC | PRN

## 2024-12-08 MED ORDER — SENNOSIDES-DOCUSATE SODIUM 8.6-50 MG PO TABS
1.0000 | ORAL_TABLET | Freq: Two times a day (BID) | ORAL | Status: DC
  Administered 2024-12-08 – 2024-12-09 (×3): 1
  Filled 2024-12-08 (×2): qty 1

## 2024-12-08 NOTE — Progress Notes (Signed)
 "  Providing Compassionate, Quality Care - Together   Subjective: Patient's wife and daughter at the bedside. They report the patient is making improvements every day.  Objective: Vital signs in last 24 hours: Temp:  [99 F (37.2 C)-99.3 F (37.4 C)] 99.1 F (37.3 C) (01/13 0400) Pulse Rate:  [45-68] 52 (01/13 0800) Resp:  [16-25] 20 (01/13 0800) BP: (128-147)/(59-73) 143/73 (01/13 0800) SpO2:  [90 %-94 %] 90 % (01/13 0800) Weight:  [76.4 kg] 76.4 kg (01/13 0500)  Intake/Output from previous day: 01/12 0701 - 01/13 0700 In: 1377.8 [I.V.:1035.6; NG/GT:342.2] Out: 1025 [Urine:1025] Intake/Output this shift: Total I/O In: 123.7 [I.V.:45; NG/GT:78.7] Out: -   Oriented to self PERRLA Speech clear Purposeful movement RUE, RLE Withdraws on LUE,LLE  Lab Results: Recent Labs    12/05/24 1938  WBC 12.7*  HGB 15.0  HCT 42.6  PLT 162   BMET Recent Labs    12/05/24 1938 12/05/24 2310 12/08/24 0000 12/08/24 0606  NA 135   < > 159* 160*  K 4.4  --   --  3.4*  CL 99  --   --  127*  CO2 24  --   --  21*  GLUCOSE 92  --   --  139*  BUN 14  --   --  38*  CREATININE 1.07  --   --  1.07  CALCIUM 9.2  --   --  8.4*   < > = values in this interval not displayed.    Studies/Results: MR BRAIN W WO CONTRAST Result Date: 12/07/2024 EXAM: MRI BRAIN WITH AND WITHOUT CONTRAST 12/07/2024 04:58:00 AM TECHNIQUE: Multiplanar multisequence MRI of the head/brain was performed with and without the administration of intravenous contrast. COMPARISON: Head CT yesterday and earlier. CLINICAL HISTORY: 79 year old male. Stroke, hemorrhagic. FINDINGS: BRAIN AND VENTRICLES: No acute infarct. Redemonstrated large right hemispheric hemorrhage with layering hematocrit level. By MRI and including some of the layering seroma the intra axial hematoma encompasses 82 x 59 x 44 mm (AP x transverse x CC). Estimated volume: 100 mL. Internal blood products range from isointense to hyperintense on T1 imaging,  extremely hypointense to hyperintense on T2 imaging. No convincing enhancement is identified within the hemorrhage. Confluent edema surrounding the hemorrhage which partially tracks into the right deep white matter capsules. The right basal ganglia and thalami appear negative except for mass effect. Only trace intraventricular extension of blood such as layering in the left occipital horn. Small right side subdural hematoma is confirmed measuring 3 mm (series 11 image 27) and also likely accounts for the appearance of mild asymmetry right sided pachymeningeal thickening and enhancement. Intracranial mass effect with subtotal effacement of the right lateral ventricle and leftward midline shift of 9 mm (series 11 image 30). No ventriculomegaly or transependymal edema. Patchy nonspecific periventricular and other widely scattered white matter T2 and FLAIR hyperintensity. On SWI, there are occasional areas of possible chronic microhemorrhage and superficial siderosis (right parietal lobe series 12 image 52, left operculum series 12 image 42). There is confirmed a small right lateral temporal lobe probably dural based semicircular and homogeneously enhancing mass measuring 10 x 5 x 14 mm, most resembling a small meningioma. Susceptibility artifact on diffusion weighted imaging associated with the blood products. No larger area of restricted diffusion. Basilar cisterns remain patent. Following contrast the major dural venous sinuses are enhancing and patent. No other abnormal intra axial enhancement. ORBITS: No acute abnormality. SINUSES: Trace left mastoid effusion. BONES AND SOFT TISSUES: Negative visible cervical spine.  Normal bone marrow signal and enhancement. No acute soft tissue abnormality. IMPRESSION: 1. Large right hemisphere hemorrhage (estimated 100 mL by MRI) with surrounding confluent edema. At this time no evidence of associated tumor (see #3) but recommend repeat MRI without and with contrast once the  hematoma has mostly resolved (e.g. in 3 months). Several areas of bilateral superficial siderosis - Amyloid angiopathy is not excluded. 2. Small right subdural hematoma (3 mm) and trace intraventricular hemorrhage. Intracranial mass effect with leftward midline shift of 9 mm. 3. 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 4. Moderate for age nonspecific white matter signal changes, most commonly due to chronic small vessel disease Electronically signed by: Helayne Hurst MD MD 12/07/2024 05:18 AM EST RP Workstation: [id number]   ECHOCARDIOGRAM COMPLETE Result Date: 12/06/2024    ECHOCARDIOGRAM REPORT   Patient Name:   Dwayne [name] Date of Exam: 12/06/2024 Medical Rec #:  [id number]       Height:       70.0 in Accession #:    [id number]      Weight:       166.0 lb Date of Birth:  [birth year]        BSA:          1.928 m Patient Age:    78 years        BP:           143/67 mmHg Patient Gender: M               HR:           65 bpm. Exam Location:  Inpatient Procedure: 2D Echo, Cardiac Doppler and Color Doppler (Both Spectral and Color            Flow Doppler were utilized during procedure). Indications:    Stroke  History:        Patient has no prior history of Echocardiogram examinations.  Sonographer:    Sherlean Dubin Referring Phys: [id number] ZEKE HERO [facility] IMPRESSIONS  1. Left ventricular ejection fraction, by estimation, is 60 to 65%. Left ventricular ejection fraction by 2D MOD biplane is 63.0 %. The left ventricle has normal function. The left ventricle has no regional wall motion abnormalities. Left ventricular diastolic parameters were normal.  2. Right ventricular systolic function is normal. The right ventricular size is normal.  3. The mitral valve is normal in structure. No evidence of mitral valve regurgitation. No evidence of mitral stenosis.  4. The aortic valve is normal in structure. Aortic valve regurgitation is mild. Aortic valve sclerosis/calcification is  present, without any evidence of aortic stenosis.  5. There is mild dilatation of the ascending aorta, measuring 41 mm.  6. The inferior vena cava is dilated in size with <50% respiratory variability, suggesting right atrial pressure of 15 mmHg. Comparison(s): No prior Echocardiogram. FINDINGS  Left Ventricle: Left ventricular ejection fraction, by estimation, is 60 to 65%. Left ventricular ejection fraction by 2D MOD biplane is 63.0 %. The left ventricle has normal function. The left ventricle has no regional wall motion abnormalities. The left ventricular internal cavity size was normal in size. There is no left ventricular hypertrophy. Left ventricular diastolic parameters were normal. Right Ventricle: The right ventricular size is normal. No increase in right ventricular wall thickness. Right ventricular systolic function is normal. Left Atrium: Left atrial size was normal in size. Right Atrium: Right atrial size was normal in size. Pericardium: There is no evidence of pericardial effusion. Mitral  Valve: The mitral valve is normal in structure. No evidence of mitral valve regurgitation. No evidence of mitral valve stenosis. Tricuspid Valve: The tricuspid valve is normal in structure. Tricuspid valve regurgitation is not demonstrated. No evidence of tricuspid stenosis. Aortic Valve: The aortic valve is normal in structure. Aortic valve regurgitation is mild. Aortic valve sclerosis/calcification is present, without any evidence of aortic stenosis. Aortic valve mean gradient measures 7.5 mmHg. Aortic valve peak gradient measures 14.3 mmHg. Aortic valve area, by VTI measures 2.33 cm. Pulmonic Valve: The pulmonic valve was normal in structure. Pulmonic valve regurgitation is not visualized. No evidence of pulmonic stenosis. Aorta: The aortic root is normal in size and structure. There is mild dilatation of the ascending aorta, measuring 41 mm. Venous: The inferior vena cava is dilated in size with less than 50%  respiratory variability, suggesting right atrial pressure of 15 mmHg. IAS/Shunts: The interatrial septum was not well visualized.  LEFT VENTRICLE PLAX 2D                        Biplane EF (MOD) LVIDd:         5.00 cm         LV Biplane EF:   Left LVIDs:         3.20 cm                          ventricular LV PW:         0.90 cm                          ejection LV IVS:        0.80 cm                          fraction by LVOT diam:     2.20 cm                          2D MOD LV SV:         102                              biplane is LV SV Index:   53                               63.0 %. LVOT Area:     3.80 cm                                Diastology                                LV e' medial:    9.90 cm/s LV Volumes (MOD)               LV E/e' medial:  10.9 LV vol d, MOD    50.9 ml       LV e' lateral:   12.10 cm/s A2C:                           LV E/e' lateral: 8.9 LV vol d,  MOD    91.0 ml A4C: LV vol s, MOD    23.5 ml A2C: LV vol s, MOD    25.7 ml A4C: LV SV MOD A2C:   27.4 ml LV SV MOD A4C:   91.0 ml LV SV MOD BP:    46.8 ml RIGHT VENTRICLE             IVC RV Basal diam:  3.70 cm     IVC diam: 2.40 cm RV Mid diam:    3.00 cm RV S prime:     14.60 cm/s TAPSE (M-mode): 1.6 cm LEFT ATRIUM             Index        RIGHT ATRIUM           Index LA diam:        2.90 cm 1.50 cm/m   RA Area:     19.70 cm LA Vol (A2C):   63.5 ml 32.93 ml/m  RA Volume:   55.20 ml  28.62 ml/m LA Vol (A4C):   28.0 ml 14.52 ml/m LA Biplane Vol: 44.9 ml 23.28 ml/m  AORTIC VALVE AV Area (Vmax):    2.24 cm AV Area (Vmean):   2.26 cm AV Area (VTI):     2.33 cm AV Vmax:           189.00 cm/s AV Vmean:          126.500 cm/s AV VTI:            0.437 m AV Peak Grad:      14.3 mmHg AV Mean Grad:      7.5 mmHg LVOT Vmax:         111.50 cm/s LVOT Vmean:        75.200 cm/s LVOT VTI:          0.268 m LVOT/AV VTI ratio: 0.61  AORTA Ao Root diam: 3.40 cm Ao Asc diam:  4.10 cm MITRAL VALVE                TRICUSPID VALVE MV Area (PHT): 3.65 cm      TR Peak grad:   12.5 mmHg MV Decel Time: 208 msec     TR Vmax:        177.00 cm/s MV E velocity: 108.00 cm/s MV A velocity: 107.00 cm/s  SHUNTS MV E/A ratio:  1.01         Systemic VTI:  0.27 m                             Systemic Diam: 2.20 cm Joelle Azobou Tonleu Electronically signed by Joelle Cedars Tonleu Signature Date/Time: 12/06/2024/3:11:01 PM    Final    US  EKG SITE RITE Result Date: 12/06/2024 If Site Rite image not attached, placement could not be confirmed due to current cardiac rhythm.  CT HEAD WO CONTRAST ( ) Result Date: 12/06/2024 EXAM: CT HEAD WITHOUT CONTRAST 12/06/2024 10:34:22 AM TECHNIQUE: CT of the head was performed without the administration of intravenous contrast. Automated exposure control, iterative reconstruction, and/or weight based adjustment of the mA/kV was utilized to reduce the radiation dose to as low as reasonably achievable. COMPARISON: CT head and CTA head and neck from yesterday. CLINICAL HISTORY: 79 year old male. Presented with large right hemisphere hemorrhage yesterday. FINDINGS: BRAIN AND VENTRICLES: Large hyperdense and mixed density hemorrhage in the right hemisphere with layering hematocrit level now encompasses approximately 79 x 57 x 40  mm (AP x transverse x CC). Estimated volume: 94 mL. Allowing for some redistribution, this does not appear significantly changed in size or configuration since 2145 hours yesterday. Intracranial mass effect with leftward midline shift of 8 mm and subtotal effacement of the right lateral ventricle also stable. On axial images, the midline shift is estimated at 11 mm and stable. No ventriculomegaly. No convincing intraventricular hemorrhage. There is trace extra-axial hyperdense blood along the right operculum (series 4, image 21), or could be artifact from draining cortical vein there, but regardless is stable since presentation. Edema in the right hemisphere is stable. There is questionable ill-defined enlargement of the right  caudate and anterior basal ganglia superimposed, along the anteromedial margin of the hemorrhage and edema (such as series 4, image 18). Basilar cisterns remain patent. No evidence of acute infarct. Hemorrhagic tumor is not excluded. Brain MRI without and with contrast recommended when feasible. Calcified atherosclerosis at the skull base. No suspicious intracranial vascular hyperdensity. ORBITS: No acute abnormality. SINUSES: Paranasal sinuses, tympanic cavities and mastoids remain well aerated. SOFT TISSUES AND SKULL: No acute soft tissue abnormality. No skull fracture. IMPRESSION: 1. Large right hemispheric parenchymal hemorrhage (estimated volume 94 mL), stable from 2145 hours yesterday along with edema, mass effect, and leftward midline shift (11 mm on axials). 2. Possible abnormal expansion of the Right caudate/basal ganglia along the medial margin. Hemorrhagic tumor not excluded, MRI without and with contrast recommended when feasible. 3. Questionable trace extra-axial blood along the right operculum, stable. Electronically signed by: Helayne Hurst MD MD 12/06/2024 10:46 AM EST RP Workstation: [id number]    Assessment/Plan: Patient with right posterior temporal lobe hemorrhage. Was on Xarelto  for DVT and PE. This was reversed in the ED. Surgical intervention was not recommended. Hypertonic saline started on 12/05/2024 and discontinued this morning.   LOS: 3 days   -Continue supportive efforts. -Neurosurgery will sign off at this time. Please re-consult if we can be of further assistance.  I am in communication with my attending and they agree with the plan for this patient.   [name], DNP, AGNP-C Nurse Practitioner  [facility] [address], [place], [place] [address] P: [phone]    F: [phone]  12/08/2024, 10:32 AM     "

## 2024-12-08 NOTE — TOC Progression Note (Signed)
 Transition of Care [facility]) - Progression Note    Patient Details  Name: Dwayne [name] MRN: [id number] Date of Birth: 26-Nov-1946  Transition of Care [facility]) CM/SW Contact  Aerika Groll E Colan Laymon, LCSW Phone Number: 12/08/2024, 2:13 PM  Clinical Narrative:    ICM is following for CIR eligibility when medically appropriate.                      Expected Discharge Plan and Services                                               Social Drivers of Health (SDOH) Interventions SDOH Screenings   Food Insecurity: No Food Insecurity (12/05/2024)  Housing: Unknown (12/05/2024)  Depression (PHQ2-9): Low Risk (10/05/2024)  Tobacco Use: Low Risk (12/05/2024)    Readmission Risk Interventions     No data to display

## 2024-12-08 NOTE — PMR Pre-admission (Signed)
 PMR Admission Coordinator Pre-Admission Assessment  Patient: Dwayne [name] is an 79 y.o., male MRN: [id number] DOB: [birth year] Height: 5' 10 (177.8 cm) Weight: 86 kg  Insurance Information HMO:     PPO:      PCP:      IPA:      80/20:      OTHER:  PRIMARY: Medicare Part A and B      Policy#: [id number]       Subscriber: pt CM Name:       Phone#:      Fax#:  Pre-Cert#: verified Health And Safety Inspector:  Benefits:  Phone #:      Name:  Eff. Date: 04/26/12 A, 06/26/22 B     Deduct: $1736      Out of Pocket Max: n/a      Life Max: n/a CIR: 100%      SNF: 20 full days (none used) Outpatient: 80%     Co-Pay: 20% Home Health: 100%      Co-Pay:  DME: 80%     Co-Pay: 20% Providers:  SECONDARYBETHA BRIGHTER      Policy#: [id number]     Phone#: [phone]  Financial Counselor:       Phone#:   The Data Collection Information Summary for patients in Inpatient Rehabilitation Facilities with attached Privacy Act Statement-Health Care Records was provided and verbally reviewed with: Patient and Family  Emergency Contact Information Contact Information     Name Relation Home Work Mobile   [place] Spouse [phone]        Other Contacts     Name Relation Home Work Mobile   [place] Daughter   [phone]       Current Medical History  Patient Admitting Diagnosis: ICH   History of Present Illness: Pt is a 79 y/o male with 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  In ED NIHSS 8, WBC 12.5, INR 1.5.  Code stroke was not called due to being outside the window for TNK/IR.  Imaging revealed mixed density lobar hemorrhage in his right posterior temporal lobe.  Neurosurgery consulted and felt likely ischemic insult with hemorrhagic conversion, no operative intervention recommended.  Neurology admitted pt, xarelto  was reversed.  Serial CTs showed increased ICH with increased MLS but stable  clinical picture, neurosurgery continued to recommend medical management with hypertonic saline.  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  No plans for [facility] or IVC filter at this time.  Therapy evaluations completed and pt was recommended for CIR.   Complete NIHSS TOTAL: 17  Patient's medical record from [name] has been reviewed by the rehabilitation admission coordinator and physician.  Past Medical History  Past Medical History:  Diagnosis Date   Arrhythmia    Bilateral pulmonary embolism (HCC) 05/28/2016   03/03/16   DVT of lower limb, acute (HCC)    left popliteal tibial and peroneal vein   Hypertension    Retroperitoneal bleed 12/25/2016   Cough x 1 week on Xarelto  extensive abdominal wall hematoma tracking down to penis & scrotum    Has the patient had major surgery during 100 days prior to admission? No  Family History   family history includes Cancer in his paternal grandfather; Diabetes in his father; Stroke in his father and maternal grandmother.  Current Medications Current Medications[1]  Patients  Current Diet:  Diet Order             Diet full liquid Room service appropriate? No; Fluid consistency: Thin  Diet effective now                   Precautions / Restrictions Precautions Precautions: Fall Precaution/Restrictions Comments: L inattention, doesnt cross midline with tracking of eyes Restrictions Weight Bearing Restrictions Per Provider Order: No   Has the patient had 2 or more falls or a fall with injury in the past year? Yes  Prior Activity Level Community (5-7x/wk): independent without DME, driving, very active  Prior Functional Level Self Care: Did the patient need help bathing, dressing, using the toilet or eating? Independent  Indoor Mobility: Did the patient need assistance with walking from room to room (with or without device)?  Independent  Stairs: Did the patient need assistance with internal or external stairs (with or without device)? Independent  Functional Cognition: Did the patient need help planning regular tasks such as shopping or remembering to take medications? Independent  Patient Information Are you of Hispanic, Latino/a,or Spanish origin?: A. No, not of Hispanic, Latino/a, or Spanish origin What is your race?: A. White Do you need or want an interpreter to communicate with a doctor or health care staff?: 0. No  Patient's Response To:  Health Literacy and Transportation Is the patient able to respond to health literacy and transportation needs?: Yes Health Literacy - How often do you need to have someone help you when you read instructions, pamphlets, or other written material from your doctor or pharmacy?: Never In the past 12 months, has lack of transportation kept you from medical appointments or from getting medications?: No In the past 12 months, has lack of transportation kept you from meetings, work, or from getting things needed for daily living?: No  Home Assistive Devices / Equipment Home Equipment: [place] - single point  Prior Device Use: Indicate devices/aids used by the patient prior to current illness, exacerbation or injury? None of the above  Current Functional Level Cognition  Arousal/Alertness: Awake/alert Overall Cognitive Status: Impaired/Different from baseline Orientation Level: Oriented X4 Attention: Sustained Sustained Attention: Impaired Sustained Attention Impairment: Functional basic Memory: Impaired Memory Impairment: Decreased recall of new information Awareness: Impaired Awareness Impairment: Intellectual impairment Problem Solving: Impaired Problem Solving Impairment: Functional basic    Extremity Assessment (includes Sensation/Coordination)  Upper Extremity Assessment: LUE deficits/detail LUE Deficits / Details: 1/5 MMT digit flexion/ext, 1/5 elbow flexion  - otherwise not activation noted. no functional use of L. responds to pain LUE Sensation: decreased light touch, decreased proprioception LUE Coordination: decreased fine motor, decreased gross motor  Lower Extremity Assessment: Defer to PT evaluation LLE Deficits / Details: grossly 2+/5, able to stand with support of L knee blocking to prevent buckling    ADLs  Overall ADL's : Needs assistance/impaired Eating/Feeding: NPO Grooming: Maximal assistance Upper Body Bathing: Maximal assistance Lower Body Bathing: Total assistance Upper Body Dressing : Maximal assistance Lower Body Dressing: Total assistance Toilet Transfer: Total assistance Toileting- Clothing Manipulation and Hygiene: Total assistance Functional mobility during ADLs: Maximal assistance, +2 for safety/equipment, +2 for physical assistance General ADL Comments: limited by L weakness, R gaze, L inattention, poor balance, limited command following - no carryover of commands and no awareness of midline    Mobility  Overal bed mobility: Needs Assistance Bed Mobility: Rolling, Sidelying to Sit, Sit to Supine Rolling: Max assist, Used rails (to the L) Sidelying to sit: Max assist, +2 for  physical assistance, HOB elevated, Used rails Sit to supine: Max assist, +2 for physical assistance General bed mobility comments: pt with L neglect requiring max directional verbal and tactile cues, pt unable to initiate transfer, pt with strong resistance and pushing to the L with R UE this date    Transfers  Overall transfer level: Needs assistance Equipment used: 2 person hand held assist (face to face transfer with bed pad) Transfers: Sit to/from Stand Sit to Stand: +2 physical assistance, Max assist General transfer comment: maxAx2 to power up, unable to achieve full upright posture/trunk extension, L knee blocked    Ambulation / Gait / Stairs / Wheelchair Mobility  Ambulation/Gait General Gait Details: unable this date    Posture /  Balance Dynamic Sitting Balance Sitting balance - Comments: pushing with R UE to the L, L lateral lean Balance Overall balance assessment: Needs assistance Sitting-balance support: Feet supported, Single extremity supported Sitting balance-Leahy Scale: Poor Sitting balance - Comments: pushing with R UE to the L, L lateral lean Postural control: Left lateral lean Standing balance support: During functional activity, Reliant on assistive device for balance, No upper extremity supported Standing balance-Leahy Scale: Poor Standing balance comment: dependent on external assist    Special considerations/life events  N/A   Previous Home Environment (from acute therapy documentation) Living Arrangements: Spouse/significant other  Lives With: Spouse Available Help at Discharge: Family Type of Home: Independent living facility Home Layout: One level Home Access: Level entry Bathroom Shower/Tub: Health Visitor: Handicapped height Additional Comments: lives with wife in indep apartment at [facility]  Discharge Living Setting Plans for Discharge Living Setting: Lives with (comment) (spouse) Type of Home at Discharge: Independent living facility Care Facility Name at Discharge: [facility] Discharge Home Layout: One level Discharge Home Access: Level entry Discharge Bathroom Shower/Tub: Walk-in shower Discharge Bathroom Toilet: Handicapped height Discharge Bathroom Accessibility: Yes How Accessible: Accessible via walker Does the patient have any problems obtaining your medications?: No  Social/Family/Support Systems Patient Roles: Spouse Anticipated Caregiver: [name] (spouse) Anticipated Caregiver's Contact Information: [phone] Ability/Limitations of Caregiver: min assist Caregiver Availability: 24/7 Discharge Plan Discussed with Primary Caregiver: Yes Is Caregiver In Agreement with Plan?: Yes Does Caregiver/Family have Issues with Lodging/Transportation while Pt  is in Rehab?: No  Goals Patient/Family Goal for Rehab: PT/OT/SLP min assist Expected length of stay: 24-28 days Additional Information: Discharge plan: ideally will discharge home with spouse at min assist or better, but would be able to d/c to SNF at [facility] if needed Pt/Family Agrees to Admission and willing to participate: Yes Program Orientation Provided & Reviewed with Pt/Caregiver Including Roles  & Responsibilities: Yes  Decrease burden of Care through IP rehab admission: No.   Possible need for SNF placement upon discharge: Not anticipated, but potentially.  Plan for min assist goals and d/c back to ILF with spouse, but if pt does not reach a level she can manage would need to transition to SNF for further rehab.    Patient Condition: I have reviewed medical records from [facility], spoken with [facility] team, and patient, spouse, and daughter. I met with patient at the bedside for inpatient rehabilitation assessment.  Patient will benefit from ongoing PT, OT, and SLP, can actively participate in 3 hours of therapy a day 5 days of the week, and can make measurable gains during the admission.  Patient will also benefit from the coordinated team approach during an Inpatient Acute Rehabilitation admission.  The patient will receive intensive therapy as well as Rehabilitation physician,  nursing, child psychotherapist, and care management interventions.  Due to bladder management, bowel management, safety, skin/wound care, disease management, medication administration, pain management, and patient education the patient requires 24 hour a day rehabilitation nursing.  The patient is currently max +2 with mobility and basic ADLs.  Discharge setting and therapy post discharge at [facility] is anticipated.  Patient has agreed to participate in the Acute Inpatient Rehabilitation Program and will admit today.  Preadmission Screen Completed By:  Reche FORBES Lowers, PT, DPT 12/09/2024 1:46  PM ______________________________________________________________________   Discussed status with Dr. Urbano on 12/09/2024  at 1:47 PM  and received approval for admission today.  Admission Coordinator:  Caitlin E Warren, PT, DPT time 1:47 PM Pattricia 12/09/2024    Assessment/Plan: Diagnosis:  traumatic right temporal hemorrhage 12/05/2024 complicated by cerebral edema receiving hypertonic saline through 12/06/2024  Does the need for close, 24 hr/day Medical supervision in concert with the patient's rehab needs make it unreasonable for this patient to be served in a less intensive setting? Yes Co-Morbidities requiring supervision/potential complications: HTN, Dysphagia, DVT/PE history, fever, leukocytosis Due to bladder management, bowel management, safety, skin/wound care, disease management, medication administration, pain management, and patient education, does the patient require 24 hr/day rehab nursing? Yes Does the patient require coordinated care of a physician, rehab nurse, PT, OT, and SLP to address physical and functional deficits in the context of the above medical diagnosis(es)? Yes Addressing deficits in the following areas: balance, endurance, locomotion, strength, transferring, bowel/bladder control, bathing, dressing, feeding, grooming, toileting, cognition, speech, language, swallowing, and psychosocial support Can the patient actively participate in an intensive therapy program of at least 3 hrs of therapy 5 days a week? Yes The potential for patient to make measurable gains while on inpatient rehab is excellent Anticipated functional outcomes upon discharge from inpatient rehab: min assist PT, min assist OT, min assist SLP Estimated rehab length of stay to reach the above functional goals is: 24-28 days Anticipated discharge destination: Home 10. Overall Rehab/Functional Prognosis: good   MD Signature: Murray Urbano     [1]  Current Facility-Administered Medications:     acetaminophen  (TYLENOL ) tablet 650 mg, 650 mg, Oral, Q4H PRN, 650 mg at 12/09/24 1032 **OR** acetaminophen  (TYLENOL ) 160 MG/5ML solution 650 mg, 650 mg, Per Tube, Q4H PRN, 650 mg at 12/08/24 1238 **OR** acetaminophen  (TYLENOL ) suppository 650 mg, 650 mg, Rectal, Q4H PRN, Waddell Aquas A, NP, 650 mg at 12/06/24 2348   carvedilol  (COREG ) tablet 3.125 mg, 3.125 mg, Per Tube, BID WC, Shafer, Devon, NP, 3.125 mg at 12/09/24 9178   Chlorhexidine  Gluconate Cloth 2 % PADS 6 each, 6 each, Topical, Q0600, Remi Pippin, NP, 6 each at 12/09/24 0547   enoxaparin  (LOVENOX ) injection 40 mg, 40 mg, Subcutaneous, Q24H, Rosemarie, Pramod S, MD, 40 mg at 12/08/24 2012   feeding supplement (OSMOLITE 1.5 CAL) liquid 1,000 mL, 1,000 mL, Per Tube, Continuous, Rosemarie Eather RAMAN, MD, Last Rate: 50 mL/hr at 12/09/24 1341, 1,000 mL at 12/09/24 1341   feeding supplement (PROSource TF20) liquid 60 mL, 60 mL, Per Tube, BID, Rosemarie, Pramod S, MD, 60 mL at 12/09/24 1033   hydrALAZINE  (APRESOLINE ) injection 10-20 mg, 10-20 mg, Intravenous, Q4H PRN, de Clint Kill, Cortney E, NP, 20 mg at 12/09/24 0409   labetalol  (NORMODYNE ) injection 10-20 mg, 10-20 mg, Intravenous, Q2H PRN, de Clint Kill, Cortney E, NP   multivitamin with minerals tablet 1 tablet, 1 tablet, Per Tube, Daily, Sethi, Pramod S, MD, 1 tablet at 12/09/24 1033   ondansetron  (  ZOFRAN ) injection 4 mg, 4 mg, Intravenous, Q6H PRN, de Clint Kill, Cortney E, NP, 4 mg at 12/07/24 0907   Oral care mouth rinse, 15 mL, Mouth Rinse, 4 times per day, Remi Pippin, NP, 15 mL at 12/09/24 1240   Oral care mouth rinse, 15 mL, Mouth Rinse, PRN, Remi Pippin, NP   pantoprazole  (PROTONIX ) injection 40 mg, 40 mg, Intravenous, QHS, Waddell Aquas A, NP, 40 mg at 12/08/24 2012   senna-docusate (Senokot-S) tablet 1 tablet, 1 tablet, Per Tube, BID, Remi Pippin, NP, 1 tablet at 12/09/24 1033   sodium chloride  flush (NS) 0.9 % injection 10-40 mL, 10-40 mL, Intracatheter, Q12H, Shafer, Devon, NP, 10 mL at  12/09/24 1033   sodium chloride  flush (NS) 0.9 % injection 10-40 mL, 10-40 mL, Intracatheter, PRN, Remi Pippin, NP   thiamine  (VITAMIN B1) tablet 100 mg, 100 mg, Per Tube, Daily, Sethi, Pramod S, MD, 100 mg at 12/09/24 [phone]

## 2024-12-08 NOTE — Progress Notes (Signed)
 Speech Language Pathology Treatment: Dysphagia;Cognitive-Linguistic  Patient Details Name: Dwayne [name] MRN: [id number] DOB: 04/28/1946 Today's Date: 12/08/2024 Time: 8856-8792 SLP Time Calculation (min) (ACUTE ONLY): 24 min  Assessment / Plan / Recommendation Clinical Impression  Pt's level of alertness is more consistent, especially after giving extensive cueing to open his eyes. His attention is severely impaired and he has decreased awareness. He also exhibits pragmatic deficits related to tone, affect, and topic maintenance. He tracked to the L of midline x1 to make eye contact with his daughter but was not stimulable to do this repeatedly. He held the cup to take sips of thin liquids without signs clinically concerning for aspiration, though RN reports some coughing with small sips earlier this date. Mastication of regular solids was functional. Swallowing is frequently followed by eructation and pt/his family describe occasional globus sensation and regurgitation (specifically with chicken) PTA. Education was provided to pt, his spouse, and his daughter.   PLAN: Pt can continue to have ice chips and small, controlled sips of water  in moderation with frequent oral care. SLP will f/u but anticipate that pt is nearing readiness to participate in an MBS.    HPI HPI: 79 yo male presenting to ED 1/10 with headache and lethargy x24 hours as well as progressive confusion and L sided weakness s/p falls x2. CTH showed large acute R temporal parenchymal hemorrhage with moderate edema and leftward midline shift, increasing in size on repeat imaging. 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 Pt initially passed the [facility] 1/10 but SLP was consulted 1/11 due to change in mentation. 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

## 2024-12-08 NOTE — Progress Notes (Signed)
 STROKE TEAM PROGRESS NOTE    SIGNIFICANT HOSPITAL EVENTS 1/10- Admitted with ICH 1/11 - Worsening ICH and then stabilized. 3% started, PICC ordered   INTERIM HISTORY/SUBJECTIVE Seen in room with his wife at the bedside.  More alert this morning and follows commands well with persistent but improving left sided neglect.  Speech therapy still recommending n.p.o. except meds with ice chips.  Blood pressure adequately controlled. Will discuss  with hematology whether he needs resumption of anticoagulation at some point in the future or not given prior history of DVT and pulmonary embolism. OBJECTIVE  CBC    Component Value Date/Time   WBC 12.7 (H) 12/05/2024 1938   RBC 5.06 12/05/2024 1938   HGB 15.0 12/05/2024 1938   HGB 14.9 03/04/2018 0933   HCT 42.6 12/05/2024 1938   HCT 44.8 03/04/2018 0933   PLT 162 12/05/2024 1938   PLT 197 03/04/2018 0933   MCV 84.2 12/05/2024 1938   MCV 87 03/04/2018 0933   MCH 29.6 12/05/2024 1938   MCHC 35.2 12/05/2024 1938   RDW 12.6 12/05/2024 1938   RDW 13.6 03/04/2018 0933   LYMPHSABS 2.4 03/04/2018 0933   MONOABS 0.9 12/28/2016 1117   EOSABS 0.2 03/04/2018 0933   BASOSABS 0.0 03/04/2018 0933    BMET    Component Value Date/Time   NA 157 (H) 12/08/2024 1151   NA 136 03/04/2018 0933   K 3.4 (L) 12/08/2024 0606   CL 127 (H) 12/08/2024 0606   CO2 21 (L) 12/08/2024 0606   GLUCOSE 139 (H) 12/08/2024 0606   BUN 38 (H) 12/08/2024 0606   BUN 18 03/04/2018 0933   CREATININE 1.07 12/08/2024 0606   CREATININE 0.87 07/27/2015 1154   CALCIUM 8.4 (L) 12/08/2024 0606   GFRNONAA >60 12/08/2024 0606   Vitals:   12/08/24 1000 12/08/24 1100 12/08/24 1200 12/08/24 1300  BP: (!) 149/121 (!) 140/65 138/69 (!) 131/59  Pulse: 87 83  73  Resp: (!) 28 (!) 34 (!) 21 (!) 21  Temp:   (!) 100.6 F (38.1 C)   TempSrc:   Axillary   SpO2: 90% 92%  92%  Weight:      Height:         PHYSICAL EXAM General:  Well-nourished, well-developed elderly Caucasian  male patient in no acute distress Psych:  Mood and affect appropriate for situation CV: Regular rate and rhythm on monitor Respiratory:  Regular, unlabored respirations on room air GI: Abdomen soft and nontender   NEURO:  Mental Status: Drowsy, responds to name  Speech/Language: Dysarthric, limited speech   Cranial Nerves:  II: PERRL. Visual fields full.  III, IV, VI: EOMI. Eyelids elevate symmetrically.  V: Sensation is intact to light touch and symmetrical to face.  VII: Face is symmetrical resting and smiling VIII: hearing intact to voice. IX, X: Palate elevates symmetrically. Phonation is normal.  KP:Dynloizm shrug 5/5. XII: tongue is midline without fasciculations. Motor:  RUE    LUE 3+/5 - left sided neglect- difficulty following commands in LUE and LLE RLE  LLE 4/5  Tone: is normal and bulk is normal Sensation- less response to noxious stimuli on the left, left neglect Coordination: FTN intact bilaterally, HKS: no ataxia in BLE.No drift.  Gait- deferred   ASSESSMENT/PLAN  [name] is a 79 y.o. 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  NIH on Admission 8  Intracerebral Hemorrhage:  right temporal hemorrhage  Etiology:  xarelto  related CT Head - R temporal IPH with mod edema and 6mm shift  CTA head & neck 3mm focus enhancement in the posterior aspect of hemorrhage concerning for spot sign. 40% stenosis of L ICA CT Head- Increased size of the right hemisphere intraparenchymal hematoma, now measuring 6.4 x 5.5 cm (previously 6.0 x 5.0 cm). Leftward midline shift increased to 11 mm (previously 7 mm). CT head- Large right hemispheric parenchymal hemorrhage (estimated volume 94 mL), stable from 2145 hours yesterday along with edema, mass effect, and leftward midline shift (11 mm on axials). Possible abnormal expansion of the Right caudate/basal ganglia along the  medial margin. hemorrhagic tumor not excluded, MRI without and with contrast recommended when feasible. MRI  brain w wo -no underlying mass lesion. 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 Malcolm  Dysphagia Patient has post-stroke dysphagia, SLP consulted NPO Advance diet as tolerated  Leukocytosis WBC 12.7 Febrile 38.3 -> central fever Tylenol  @ 0900 UA negative, chest xray negative  Hospital day # 3    Patient neurological exam remains stable but still drowsiness and significant left hemiaplasia and neglect.  Continue strict blood pressure control with systolic goal below 160.  Hold anticoagulation.  Core track tube for feeding.  Mobilize out of bed.  Therapy consults.  Discussed with his wife at bedside about his prognosis, plan of care and answered questions.  Appreciate neurosurgery help and they have signed off..  Discussed with Dr. Cloretta hematology who feel patient does not have any immediate needs to resume anticoagulation or place IVC filter.  Recommend transfer out of ICU to neurology stepdown bed.  Will ask medical hospitalist team to assume care.  Will likely transfer to inpatient rehab in the next few days after bed available.  I personally spent a total of 50 minutes in the care of the patient today including getting/reviewing separately obtained history, performing a medically appropriate exam/evaluation, counseling and educating, placing orders, referring and communicating with other health care professionals, documenting clinical information in the EHR, independently interpreting results, and coordinating care.                Dwayne Popp, MD Medical Director [facility] Pager: [phone] 12/08/2024 2:20 PM    To contact Stroke Continuity provider, please refer to [email or web address]. After hours, contact General Neurology

## 2024-12-08 NOTE — Progress Notes (Signed)
 Physical Therapy Treatment Patient Details Name: Dwayne [name] MRN: [id number] DOB: 09-28-1946 Today's Date: 12/08/2024   History of Present Illness Pt is a 79 y.o. male who presented 12/05/24 with headache and lethargy x24 hours, x2 falls, and progressive confusion and L-sided weakness. CT head showed large acute right temporal parenchymal hemorrhage with moderate edema and leftward midline shift, increasing in size on repeat imaging. 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 PMH: DVT, PE on anticoagulation, HTN, retroperitoneal bleed, arrythmia    PT Comments  Pt was hyperfocused on drinking water  t/o session this date with no carry over of needing to wait for upright position and assist from RN. Pt continues with significant L UE and LE impaired sensation but did initiate trying to find his L UE with his R UE asking is this my elbow or your elbow.. Pt with strong L lateral lean and pushing with R UE to the L requiring constant verbal and tactile cues to keep R UE in lap. Pt unable to maintain midline position this date despite maxA. Pt more vocal this date however demo's impaired ability to sequence, impaired ability to problem solve, severe L neglect without ability to track past midline to the L with eyes despite max verbal and tactile cues. Pt requiring maxAx2 for transfers. Pt unsafe to leave up in chair this date due to strong L lateral lean and high risk of falling out of recliner at this time. Pt to continue to benefit from intensive inpatient rehab program > 3 hrs to address above deficits and achieve maximal functional recovery to return to ILF with spouse. Acute PT to cont to follow.   If plan is discharge home, recommend the following: A lot of help with walking and/or transfers;A little help with bathing/dressing/bathroom;Assist for transportation;Supervision due to cognitive status;Assistance with feeding;Assistance with cooking/housework;Direct supervision/assist for medications  management;Direct supervision/assist for financial management   Can travel by private vehicle        Equipment Recommendations  Other (comment) (TBD at next venue)    Recommendations for Other Services Rehab consult     Precautions / Restrictions Precautions Precautions: Fall Precaution/Restrictions Comments: L inattention, doesnt cross midline with tracking of eyes Restrictions Weight Bearing Restrictions Per Provider Order: No     Mobility  Bed Mobility Overal bed mobility: Needs Assistance Bed Mobility: Rolling, Sidelying to Sit, Sit to Supine Rolling: Max assist, Used rails (to the L) Sidelying to sit: Max assist, +2 for physical assistance, HOB elevated, Used rails   Sit to supine: Max assist, +2 for physical assistance   General bed mobility comments: pt with L neglect requiring max directional verbal and tactile cues, pt unable to initiate transfer, pt with strong resistance and pushing to the L with R UE this date    Transfers Overall transfer level: Needs assistance Equipment used: 2 person hand held assist (face to face transfer with bed pad) Transfers: Sit to/from Stand Sit to Stand: +2 physical assistance, Max assist           General transfer comment: maxAx2 to power up, unable to achieve full upright posture/trunk extension, L knee blocked    Ambulation/Gait               General Gait Details: unable this date   Stairs             Wheelchair Mobility     Tilt Bed    Modified Rankin (Stroke Patients Only) Modified Rankin (Stroke Patients Only) Pre-Morbid  Rankin Score: No significant disability Modified Rankin: Severe disability     Balance Overall balance assessment: Needs assistance Sitting-balance support: Feet supported, Single extremity supported Sitting balance-Leahy Scale: Poor Sitting balance - Comments: pushing with R UE to the L, L lateral lean Postural control: Left lateral lean Standing balance support: During  functional activity, Reliant on assistive device for balance, No upper extremity supported Standing balance-Leahy Scale: Poor Standing balance comment: dependent on external assist                            Communication Communication Communication: Impaired Factors Affecting Communication: Reduced clarity of speech (delayed response)  Cognition Arousal: Alert (but keeps eyes closed) Behavior During Therapy: Flat affect   PT - Cognitive impairments: Orientation, Awareness, Memory, Attention, Initiation, Sequencing, Problem solving, Safety/Judgement   Orientation impairments: Place, Time, Situation (repatedly stating he's in the bathroom)                   PT - Cognition Comments: no carry over once re-oriented to hospital and situation, L inattention, difficulty sequencing/initiating task, diffiuclty keeping eyes open t/o session Following commands: Impaired Following commands impaired: Follows one step commands with increased time (with R side more consistently than L)    Cueing Cueing Techniques: Verbal cues, Tactile cues, Visual cues  Exercises      General Comments General comments (skin integrity, edema, etc.): VSS, L eye bruising      Pertinent Vitals/Pain Pain Assessment Pain Assessment: Faces Faces Pain Scale: No hurt Pain Location: neck, restless sitting EOB reporting Its just uncomfortable    Home Living                          Prior Function            PT Goals (current goals can now be found in the care plan section) Acute Rehab PT Goals Patient Stated Goal: didn't state PT Goal Formulation: With patient/family Time For Goal Achievement: 12/21/24 Potential to Achieve Goals: Good Progress towards PT goals: Progressing toward goals    Frequency    Min 4X/week      PT Plan      Co-evaluation              AM-PAC PT 6 Clicks Mobility   Outcome Measure  Help needed turning from your back to your side while  in a flat bed without using bedrails?: A Lot Help needed moving from lying on your back to sitting on the side of a flat bed without using bedrails?: A Lot Help needed moving to and from a bed to a chair (including a wheelchair)?: A Lot Help needed standing up from a chair using your arms (e.g., wheelchair or bedside chair)?: A Lot Help needed to walk in hospital room?: Total Help needed climbing 3-5 steps with a railing? : Total 6 Click Score: 10    End of Session Equipment Utilized During Treatment: Gait belt Activity Tolerance: Patient tolerated treatment well Patient left: in bed;with call bell/phone within reach;with bed alarm set;with family/visitor present Nurse Communication: Mobility status PT Visit Diagnosis: Unsteadiness on feet (R26.81);Other abnormalities of gait and mobility (R26.89);Muscle weakness (generalized) (M62.81);History of falling (Z91.81)     Time: 9241-9178 PT Time Calculation (min) (ACUTE ONLY): 23 min  Charges:    $Therapeutic Activity: 8-22 mins $Neuromuscular Re-education: 8-22 mins PT General Charges $$ ACUTE PT VISIT: 1 Visit  Dwayne [name], PT, DPT Acute Rehabilitation Services Secure chat preferred Office #: [phone]    Dwayne [name] 12/08/2024, 9:03 AM

## 2024-12-08 NOTE — Progress Notes (Signed)
 Inpatient Rehab Coordinator Note:  I met with patient and his family (spouse and dtr) at bedside to discuss CIR recommendations and goals/expectations of CIR stay.  We reviewed 3 hrs/day of therapy, physician follow up, and average length of stay 2 weeks (dependent upon progress) with goals of min assist.  Family confirms pt does live with spouse in ILF at [facility] which has SNF available.  Pt was active and independent at baseline, having retired only 2 years ago.  At this time they do prefer CIR for intensive rehabilitation.  We reviewed Medicare benefits.  I will follow for potential admission when cleared by stroke service and bed available.   Reche Lowers, PT, DPT Admissions Coordinator [phone] 12/08/2024 11:36 AM

## 2024-12-09 ENCOUNTER — Inpatient Hospital Stay (HOSPITAL_COMMUNITY)
Admission: EM | Admit: 2024-12-09 | Discharge: 2024-12-12 | DRG: 056 | Disposition: A | Discharge status: Other Acute Inpatient Hospital | Source: Intra-hospital | Attending: Physical Medicine & Rehabilitation | Admitting: Physical Medicine & Rehabilitation

## 2024-12-09 ENCOUNTER — Inpatient Hospital Stay (HOSPITAL_COMMUNITY)

## 2024-12-09 ENCOUNTER — Other Ambulatory Visit: Payer: Self-pay

## 2024-12-09 ENCOUNTER — Encounter (HOSPITAL_COMMUNITY): Payer: Self-pay | Admitting: Physical Medicine & Rehabilitation

## 2024-12-09 DIAGNOSIS — I69391 Dysphagia following cerebral infarction: Principal | ICD-10-CM

## 2024-12-09 DIAGNOSIS — I10 Essential (primary) hypertension: Secondary | ICD-10-CM | POA: Diagnosis present

## 2024-12-09 DIAGNOSIS — E870 Hyperosmolality and hypernatremia: Secondary | ICD-10-CM | POA: Diagnosis present

## 2024-12-09 DIAGNOSIS — R1310 Dysphagia, unspecified: Secondary | ICD-10-CM | POA: Diagnosis present

## 2024-12-09 DIAGNOSIS — Z833 Family history of diabetes mellitus: Secondary | ICD-10-CM | POA: Diagnosis not present

## 2024-12-09 DIAGNOSIS — R509 Fever, unspecified: Secondary | ICD-10-CM | POA: Diagnosis not present

## 2024-12-09 DIAGNOSIS — R29707 NIHSS score 7: Secondary | ICD-10-CM | POA: Diagnosis not present

## 2024-12-09 DIAGNOSIS — D72829 Elevated white blood cell count, unspecified: Secondary | ICD-10-CM | POA: Diagnosis not present

## 2024-12-09 DIAGNOSIS — G919 Hydrocephalus, unspecified: Secondary | ICD-10-CM | POA: Diagnosis present

## 2024-12-09 DIAGNOSIS — Z86718 Personal history of other venous thrombosis and embolism: Secondary | ICD-10-CM | POA: Diagnosis not present

## 2024-12-09 DIAGNOSIS — Z888 Allergy status to other drugs, medicaments and biological substances status: Secondary | ICD-10-CM | POA: Diagnosis not present

## 2024-12-09 DIAGNOSIS — R1312 Dysphagia, oropharyngeal phase: Secondary | ICD-10-CM | POA: Diagnosis not present

## 2024-12-09 DIAGNOSIS — I610 Nontraumatic intracerebral hemorrhage in hemisphere, subcortical: Secondary | ICD-10-CM | POA: Diagnosis not present

## 2024-12-09 DIAGNOSIS — S065XAA Traumatic subdural hemorrhage with loss of consciousness status unknown, initial encounter: Secondary | ICD-10-CM | POA: Diagnosis present

## 2024-12-09 DIAGNOSIS — Z880 Allergy status to penicillin: Secondary | ICD-10-CM

## 2024-12-09 DIAGNOSIS — I69319 Unspecified symptoms and signs involving cognitive functions following cerebral infarction: Secondary | ICD-10-CM | POA: Diagnosis not present

## 2024-12-09 DIAGNOSIS — S065X9S Traumatic subdural hemorrhage with loss of consciousness of unspecified duration, sequela: Secondary | ICD-10-CM

## 2024-12-09 DIAGNOSIS — G932 Benign intracranial hypertension: Secondary | ICD-10-CM | POA: Diagnosis not present

## 2024-12-09 DIAGNOSIS — E876 Hypokalemia: Secondary | ICD-10-CM | POA: Diagnosis not present

## 2024-12-09 DIAGNOSIS — Z79899 Other long term (current) drug therapy: Secondary | ICD-10-CM

## 2024-12-09 DIAGNOSIS — J9601 Acute respiratory failure with hypoxia: Secondary | ICD-10-CM | POA: Diagnosis not present

## 2024-12-09 DIAGNOSIS — I69392 Facial weakness following cerebral infarction: Secondary | ICD-10-CM | POA: Diagnosis not present

## 2024-12-09 DIAGNOSIS — I611 Nontraumatic intracerebral hemorrhage in hemisphere, cortical: Secondary | ICD-10-CM | POA: Diagnosis not present

## 2024-12-09 DIAGNOSIS — R414 Neurologic neglect syndrome: Secondary | ICD-10-CM | POA: Diagnosis present

## 2024-12-09 DIAGNOSIS — Z7901 Long term (current) use of anticoagulants: Secondary | ICD-10-CM

## 2024-12-09 DIAGNOSIS — S065X0A Traumatic subdural hemorrhage without loss of consciousness, initial encounter: Principal | ICD-10-CM

## 2024-12-09 DIAGNOSIS — D696 Thrombocytopenia, unspecified: Secondary | ICD-10-CM | POA: Diagnosis not present

## 2024-12-09 DIAGNOSIS — I63531 Cerebral infarction due to unspecified occlusion or stenosis of right posterior cerebral artery: Secondary | ICD-10-CM | POA: Diagnosis not present

## 2024-12-09 DIAGNOSIS — G936 Cerebral edema: Secondary | ICD-10-CM | POA: Diagnosis present

## 2024-12-09 DIAGNOSIS — Z86711 Personal history of pulmonary embolism: Secondary | ICD-10-CM

## 2024-12-09 DIAGNOSIS — D649 Anemia, unspecified: Secondary | ICD-10-CM | POA: Diagnosis not present

## 2024-12-09 DIAGNOSIS — W19XXXA Unspecified fall, initial encounter: Secondary | ICD-10-CM | POA: Diagnosis not present

## 2024-12-09 DIAGNOSIS — G935 Compression of brain: Secondary | ICD-10-CM | POA: Diagnosis not present

## 2024-12-09 DIAGNOSIS — I6381 Other cerebral infarction due to occlusion or stenosis of small artery: Secondary | ICD-10-CM | POA: Diagnosis not present

## 2024-12-09 LAB — GLUCOSE, CAPILLARY
Glucose-Capillary: 139 mg/dL — ABNORMAL HIGH (ref 70–99)
Glucose-Capillary: 112 mg/dL — ABNORMAL HIGH (ref 70–99)
Glucose-Capillary: 143 mg/dL — ABNORMAL HIGH (ref 70–99)
Glucose-Capillary: 148 mg/dL — ABNORMAL HIGH (ref 70–99)
Glucose-Capillary: 169 mg/dL — ABNORMAL HIGH (ref 70–99)

## 2024-12-09 LAB — PHOSPHORUS: Phosphorus: 2.3 mg/dL — ABNORMAL LOW (ref 2.5–4.6)

## 2024-12-09 LAB — SODIUM
Sodium: 157 mmol/L — ABNORMAL HIGH (ref 135–145)
Sodium: 158 mmol/L — ABNORMAL HIGH (ref 135–145)
Sodium: 158 mmol/L — ABNORMAL HIGH (ref 135–145)

## 2024-12-09 LAB — MAGNESIUM: Magnesium: 1.9 mg/dL (ref 1.7–2.4)

## 2024-12-09 MED ORDER — ENOXAPARIN SODIUM 40 MG/0.4ML IJ SOSY: 40.0000 mg | PREFILLED_SYRINGE | INTRAMUSCULAR | Status: DC

## 2024-12-09 MED ORDER — CARVEDILOL 3.125 MG PO TABS
3.1250 mg | ORAL_TABLET | Freq: Two times a day (BID) | ORAL | Status: DC
  Administered 2024-12-10 – 2024-12-12 (×5): 3.125 mg
  Filled 2024-12-09 (×5): qty 1

## 2024-12-09 MED ORDER — ORAL CARE MOUTH RINSE
15.0000 mL | OROMUCOSAL | Status: DC
  Administered 2024-12-09 – 2024-12-12 (×12): 15 mL via OROMUCOSAL

## 2024-12-09 MED ORDER — ENOXAPARIN SODIUM 40 MG/0.4ML IJ SOSY
40.0000 mg | PREFILLED_SYRINGE | INTRAMUSCULAR | Status: DC
  Administered 2024-12-09 – 2024-12-11 (×3): 40 mg via SUBCUTANEOUS
  Filled 2024-12-09 (×3): qty 0.4

## 2024-12-09 MED ORDER — SENNOSIDES-DOCUSATE SODIUM 8.6-50 MG PO TABS: 1.0000 | ORAL_TABLET | Freq: Two times a day (BID) | ORAL | 0 refills | Status: DC

## 2024-12-09 MED ORDER — THIAMINE MONONITRATE 100 MG PO TABS
100.0000 mg | ORAL_TABLET | Freq: Every day | ORAL | Status: DC
  Administered 2024-12-10 – 2024-12-12 (×3): 100 mg
  Filled 2024-12-09 (×3): qty 1

## 2024-12-09 MED ORDER — PROSOURCE TF20 ENFIT COMPATIBL EN LIQD
60.0000 mL | Freq: Two times a day (BID) | ENTERAL | Status: DC
  Administered 2024-12-09 – 2024-12-12 (×6): 60 mL
  Filled 2024-12-09 (×5): qty 60

## 2024-12-09 MED ORDER — SODIUM CHLORIDE 0.9% FLUSH: 10.0000 mL | INTRAVENOUS | Status: DC | PRN

## 2024-12-09 MED ORDER — STROKE: EARLY STAGES OF RECOVERY BOOK
Freq: Once | Status: AC
  Filled 2024-12-09: qty 1

## 2024-12-09 MED ORDER — OSMOLITE 1.5 CAL PO LIQD
1000.0000 mL | ORAL | Status: DC
  Administered 2024-12-09: 1000 mL

## 2024-12-09 MED ORDER — CARVEDILOL 3.125 MG PO TABS: 3.1250 mg | ORAL_TABLET | Freq: Two times a day (BID) | ORAL | 0 refills | Status: DC

## 2024-12-09 MED ORDER — ACETAMINOPHEN 325 MG PO TABS: 650.0000 mg | ORAL_TABLET | ORAL | 0 refills | Status: DC | PRN

## 2024-12-09 MED ORDER — VITAMIN B-1 100 MG PO TABS: 100.0000 mg | ORAL_TABLET | Freq: Every day | ORAL | 0 refills | Status: DC

## 2024-12-09 MED ORDER — OSMOLITE 1.5 CAL PO LIQD: 1000.0000 mL | ORAL | 0 refills | Status: DC

## 2024-12-09 MED ORDER — ORAL CARE MOUTH RINSE: 15.0000 mL | OROMUCOSAL | Status: DC | PRN

## 2024-12-09 MED ORDER — CHLORHEXIDINE GLUCONATE CLOTH 2 % EX PADS
6.0000 | MEDICATED_PAD | Freq: Two times a day (BID) | CUTANEOUS | Status: DC
  Administered 2024-12-10 – 2024-12-12 (×5): 6 via TOPICAL

## 2024-12-09 MED ORDER — PROSOURCE TF20 ENFIT COMPATIBL EN LIQD: 60.0000 mL | Freq: Two times a day (BID) | ENTERAL | 0 refills | Status: DC

## 2024-12-09 MED ORDER — SENNOSIDES-DOCUSATE SODIUM 8.6-50 MG PO TABS
1.0000 | ORAL_TABLET | Freq: Two times a day (BID) | ORAL | Status: DC
  Administered 2024-12-09 – 2024-12-12 (×6): 1
  Filled 2024-12-09 (×6): qty 1

## 2024-12-09 MED ORDER — ACETAMINOPHEN 325 MG PO TABS: 650.0000 mg | ORAL_TABLET | ORAL | Status: DC | PRN

## 2024-12-09 MED ORDER — ADULT MULTIVITAMIN W/MINERALS CH: 1.0000 | ORAL_TABLET | Freq: Every day | ORAL | 0 refills | Status: DC

## 2024-12-09 MED ORDER — ACETAMINOPHEN 650 MG RE SUPP: 650.0000 mg | RECTAL | Status: DC | PRN

## 2024-12-09 MED ORDER — ADULT MULTIVITAMIN W/MINERALS CH
1.0000 | ORAL_TABLET | Freq: Every day | ORAL | Status: DC
  Administered 2024-12-10 – 2024-12-12 (×3): 1
  Filled 2024-12-09 (×3): qty 1

## 2024-12-09 MED ORDER — ACETAMINOPHEN 160 MG/5ML PO SOLN: 650.0000 mg | ORAL | Status: DC | PRN

## 2024-12-09 NOTE — Discharge Summary (Addendum)
 Stroke Discharge Summary  Patient ID: Dwayne [name]   MRN: [id number]      DOB: 17-Mar-1946  Date of Admission: 12/05/2024 Date of Discharge: 12/09/2024  Attending Physician:  Stroke, Md, MD Consultant(s):   Treatment Team:  Cloretta Arley NOVAK, MD neurosurgery  Patient's PCP:  Charlott Dorn LABOR, MD  DISCHARGE PRIMARY DIAGNOSIS:  Intracerebral Hemorrhage:  right temporal hemorrhage  Etiology:  xarelto  related  Patient Active Problem List   Diagnosis Date Noted   Malnutrition of moderate degree 12/08/2024   ICH (intracerebral hemorrhage) (HCC) 12/05/2024   Retroperitoneal bleed 12/25/2016   Bilateral pulmonary embolism (HCC) 05/28/2016   DVT of lower limb, acute (HCC) 07/27/2015     Allergies as of 12/09/2024       Reactions   Penicillins    Thimerosal (thiomersal) Other (See Comments)        Medication List     STOP taking these medications    multivitamin capsule   Xarelto  10 MG Tabs tablet Generic drug: rivaroxaban        TAKE these medications    acetaminophen  325 MG tablet Commonly known as: TYLENOL  Take 2 tablets (650 mg total) by mouth every 4 (four) hours as needed for mild pain (pain score 1-3) or fever (or temp > 37.5 C (99.5 F)).   carvedilol  3.125 MG tablet Commonly known as: COREG  Place 1 tablet (3.125 mg total) into feeding tube 2 (two) times daily with a meal. Start taking on: December 10, 2024   feeding supplement (OSMOLITE 1.5 CAL) Liqd Place 1,000 mLs into feeding tube continuous.   feeding supplement (PROSource TF20) liquid Place 60 mLs into feeding tube 2 (two) times daily.   lisinopril-hydrochlorothiazide 10-12.5 MG tablet Commonly known as: ZESTORETIC Take 1 tablet by mouth daily.   multivitamin with minerals Tabs tablet Place 1 tablet into feeding tube daily. Start taking on: December 10, 2024   senna-docusate 8.6-50 MG tablet Commonly known as: Senokot-S Place 1 tablet into feeding tube 2 (two) times daily.   thiamine   100 MG tablet Commonly known as: Vitamin B-1 Place 1 tablet (100 mg total) into feeding tube daily. Start taking on: December 10, 2024   Vitamin D3 1000 units Caps Take 1,000 Units by mouth daily at 6 (six) AM.        LABORATORY STUDIES CBC    Component Value Date/Time   WBC 12.7 (H) 12/05/2024 1938   RBC 5.06 12/05/2024 1938   HGB 15.0 12/05/2024 1938   HGB 14.9 03/04/2018 0933   HCT 42.6 12/05/2024 1938   HCT 44.8 03/04/2018 0933   PLT 162 12/05/2024 1938   PLT 197 03/04/2018 0933   MCV 84.2 12/05/2024 1938   MCV 87 03/04/2018 0933   MCH 29.6 12/05/2024 1938   MCHC 35.2 12/05/2024 1938   RDW 12.6 12/05/2024 1938   RDW 13.6 03/04/2018 0933   LYMPHSABS 2.4 03/04/2018 0933   MONOABS 0.9 12/28/2016 1117   EOSABS 0.2 03/04/2018 0933   BASOSABS 0.0 03/04/2018 0933   CMP    Component Value Date/Time   NA 158 (H) 12/09/2024 1401   NA 136 03/04/2018 0933   K 3.4 (L) 12/08/2024 0606   CL 127 (H) 12/08/2024 0606   CO2 21 (L) 12/08/2024 0606   GLUCOSE 139 (H) 12/08/2024 0606   BUN 38 (H) 12/08/2024 0606   BUN 18 03/04/2018 0933   CREATININE 1.07 12/08/2024 0606   CREATININE 0.87 07/27/2015 1154   CALCIUM 8.4 (L) 12/08/2024 0606  PROT 7.0 12/05/2024 1938   ALBUMIN 3.9 12/05/2024 1938   AST 72 (H) 12/05/2024 1938   ALT 18 12/05/2024 1938   ALKPHOS 67 12/05/2024 1938   BILITOT 1.1 12/05/2024 1938   GFRNONAA >60 12/08/2024 0606   GFRAA 83 03/04/2018 0933   COAGS Lab Results  Component Value Date   INR 1.1 12/05/2024   INR 1.5 (H) 12/05/2024   Lipid Panel    Component Value Date/Time   CHOL 178 12/05/2024 1938   TRIG 61 12/05/2024 1938   HDL 52 12/05/2024 1938   CHOLHDL 3.4 12/05/2024 1938   VLDL 12 12/05/2024 1938   LDLCALC 113 (H) 12/05/2024 1938   HgbA1C  Lab Results  Component Value Date   HGBA1C 5.6 12/05/2024   Alcohol Level    Component Value Date/Time   [facility] <15 12/05/2024 1938     SIGNIFICANT DIAGNOSTIC STUDIES  EXAM: CT HEAD WITHOUT  CONTRAST 12/05/2024 10:53:08 AM   TECHNIQUE: CT of the head was performed without the administration of intravenous contrast. Automated exposure control, iterative reconstruction, and/or weight based adjustment of the mA/kV was utilized to reduce the radiation dose to as low as reasonably achievable.   COMPARISON: Head CT 10/08/2020.   CLINICAL HISTORY: Head trauma, moderate-severe.   FINDINGS:   BRAIN AND VENTRICLES: Large acute parenchymal hemorrhage centered in the posterior right temporal lobe measures 8.0 x 5.0 x 3.7 cm (AP x transverse x craniocaudal) (estimated volume of 74 ml). There is moderate surrounding vasogenic edema with mass effect including regional sulcal effacement, effacement of the right lateral and third ventricles, and 6 mm of leftward midline shift. There is mild mass effect on the midbrain and partial effacement of the upper basilar cisterns. No hydrocephalus, contralateral infarct, or definite extra-axial fluid collection is identified. Calcified atherosclerosis at the skull base.   ORBITS: Reported on today's separate maxillofacial CT.   SINUSES: Reported on today's separate maxillofacial CT.   SOFT TISSUES AND SKULL: Left frontal scalp soft tissue swelling. No skull fracture.   Critical results were communicated by telephone to Dr. Doretha on 12/05/2024 at 11:05 AM.   IMPRESSION: 1. Large acute right temporal parenchymal hemorrhage with moderate edema and 6 mm leftward midline shift. 2. Left frontal scalp soft tissue swelling.     EXAM: MRI BRAIN WITH AND WITHOUT CONTRAST 12/07/2024 04:58:00 AM   TECHNIQUE: Multiplanar multisequence MRI of the head/brain was performed with and without the administration of intravenous contrast.   COMPARISON: Head CT yesterday and earlier.   CLINICAL HISTORY: 79 year old male. Stroke, hemorrhagic.   FINDINGS:   BRAIN AND VENTRICLES: No acute infarct. Redemonstrated large right hemispheric  hemorrhage with layering hematocrit level. By MRI and including some of the layering seroma the intra axial hematoma encompasses 82 x 59 x 44 mm (AP x transverse x CC). Estimated volume: 100 mL. Internal blood products range from isointense to hyperintense on T1 imaging, extremely hypointense to hyperintense on T2 imaging. No convincing enhancement is identified within the hemorrhage. Confluent edema surrounding the hemorrhage which partially tracks into the right deep white matter capsules. The right basal ganglia and thalami appear negative except for mass effect. Only trace intraventricular extension of blood such as layering in the left occipital horn. Small right side subdural hematoma is confirmed measuring 3 mm (series 11 image 27) and also likely accounts for the appearance of mild asymmetry right sided pachymeningeal thickening and enhancement. Intracranial mass effect with subtotal effacement of the right lateral ventricle and leftward midline shift of 9  mm (series 11 image 30). No ventriculomegaly or transependymal edema. Patchy nonspecific periventricular and other widely scattered white matter T2 and FLAIR hyperintensity. On SWI, there are occasional areas of possible chronic microhemorrhage and superficial siderosis (right parietal lobe series 12 image 52, left operculum series 12 image 42). There is confirmed a small right lateral temporal lobe probably dural based semicircular and homogeneously enhancing mass measuring 10 x 5 x 14 mm, most resembling a small meningioma. Susceptibility artifact on diffusion weighted imaging associated with the blood products. No larger area of restricted diffusion. Basilar cisterns remain patent. Following contrast the major dural venous sinuses are enhancing and patent. No other abnormal intra axial enhancement.   ORBITS: No acute abnormality.   SINUSES: Trace left mastoid effusion.   BONES AND SOFT TISSUES: Negative visible cervical  spine. Normal bone marrow signal and enhancement. No acute soft tissue abnormality.     IMPRESSION: 1. Large right hemisphere hemorrhage (estimated 100 mL by MRI) with surrounding confluent edema. At this time no evidence of associated tumor (see #3) but recommend repeat MRI without and with contrast once the hematoma has mostly resolved (e.g. in 3 months). Several areas of bilateral superficial siderosis - Amyloid angiopathy is not excluded. 2. Small right subdural hematoma (3 mm) and trace intraventricular hemorrhage. Intracranial mass effect with leftward midline shift of 9 mm. 3. 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 4. Moderate for age nonspecific white matter signal changes, most commonly due to chronic small vessel disease  HISTORY OF PRESENT ILLNESS 79 y.o. patient with history of DVT and PE on Xarelto  was admitted with acute onset headache and left-sided weakness  HOSPITAL COURSE Patient was found to have a right temporal ICH and was admitted to the ICU.  Xarelto  was reversed.  Neurosurgery was consulted, but no operative interventions were performed.  ICH was noted to initially worsen on imaging but then stabilized, and patient was on hypertonic saline for a time.  This was discontinued, and he was transferred out of the ICU.  He is now stable and ready to be transferred to [facility] for rehabilitation.  Intracerebral Hemorrhage:  right temporal hemorrhage  Etiology:  xarelto  related CT Head - R temporal IPH with mod edema and 6mm shift  CTA head & neck 3mm focus enhancement in the posterior aspect of hemorrhage concerning for spot sign. 40% stenosis of L ICA CT Head- Increased size of the right hemisphere intraparenchymal hematoma, now measuring 6.4 x 5.5 cm (previously 6.0 x 5.0 cm). Leftward midline shift increased to 11 mm (previously 7 mm). CT head- Large right hemispheric parenchymal hemorrhage (estimated volume 94 mL), stable from 2145  hours yesterday along with edema, mass effect, and leftward midline shift (11 mm on axials). Possible abnormal expansion of the Right caudate/basal ganglia along the medial margin. hemorrhagic tumor not excluded, MRI without and with contrast recommended when feasible. MRI  brain w wo -no underlying mass lesion. 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 Malcolm   Dysphagia Patient has post-stroke dysphagia, SLP consulted NPO Advance diet as tolerated   Leukocytosis WBC 12.7 Febrile 38.3 -> central  fever Tylenol  @ 0900 UA negative, chest xray negative  RN Pressure Injury Documentation:     DISCHARGE EXAM  PHYSICAL EXAM General:  Alert, well-nourished, well-developed patient in no acute distress Psych:  Mood and affect appropriate for situation CV: Regular rate and rhythm on monitor Respiratory:  Regular, unlabored respirations on room air  NEURO:  Mental Status: AA&Ox3  Speech/Language: speech is with mild dysarthria but no aphasia  Cranial Nerves:  II: PERRL. Visual fields full.  III, IV, VI: EOMI. Eyelids elevate symmetrically.  VII: Smile is symmetrical.  VIII: hearing intact to voice. IX, X: Voice is mildly dysarthric XII: tongue is midline without fasciculations. Motor: Able to move right upper and lower extremities with good antigravity strength, some movement of left upper extremity but unable to hold break gravity, able to break gravity with left lower extremity but still weaker than right side Tone: is normal and bulk is normal Sensation- Intact to light touch bilaterally, some  left-sided neglect still present Coordination: FTN intact bilaterally Gait- deferred  1a Level of Conscious.: 0 1b LOC Questions: 0 1c LOC Commands: 0 2 Best Gaze: 0 3 Visual: 0 4 Facial Palsy: 0 5a Motor Arm - left: 3 5b Motor Arm - Right: 0 6a Motor Leg - Left: 2 6b Motor Leg - Right: 0 7 Limb Ataxia: 0 8 Sensory: 0 9 Best Language: 0 10 Dysarthria: 1 11 Extinct. and Inatten.: 1 TOTAL: 7   Discharge Diet       Diet   Diet full liquid Room service appropriate? No; Fluid consistency: Thin   liquids  DISCHARGE PLAN Disposition: Rehab No antithrombotic secondary to ICH Ongoing stroke risk factor control by Primary Care Physician at time of discharge Follow-up PCP Charlott Dorn LABOR, MD in 2 weeks. Follow-up in [facility] in 8 weeks, office to schedule an appointment.   35 minutes were spent preparing discharge.  Cortney E Everitt Clint Kill , MSN, AGACNP-BC [facility] See Amion for schedule and pager information 12/09/2024 4:33 PM    I have personally obtained history,examined this patient, reviewed notes, independently viewed imaging studies, participated in medical decision making and plan of care.ROS completed by me personally and pertinent positives fully documented  I have made any additions or clarifications directly to the above note. Agree with note above.    Eather Popp, MD Medical Director [facility] Pager: [phone] 12/10/2024 2:29 PM

## 2024-12-09 NOTE — H&P (Signed)
 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  Per chart review patient lives with spouse independent living facility/[facility].  1 level home with level entry.  Presented 12/05/2024 with headache and poor appetite as well as a fall while going to the bathroom x 2.  Denied loss of consciousness..  Cranial CT scan showed large acute right temporal parenchymal hemorrhage with moderate edema and a 6 mm leftward midline shift.  Left frontal scalp soft tissue swelling.  CT cervical spine negative.  CT maxillofacial showed acute at most minimally displaced bilateral nasal bone fractures.  Nasal and left-sided facial soft tissue swelling.  CTA head and neck showed no underlying aneurysm or vascular malformation identified.  Venous Doppler studies 12/05/2024 showed chronic DVT left femoral vein.  Patient's Xarelto  was reversed with Kcentra .  Neurosurgery consulted/Dr. Louis advised conservative care.  Hospital course 12/06/2024 patient with some altered mental status with follow-up CT/MRI showed large right hemispheric hemorrhage with surrounding confluent edema.  Small right subdural hematoma and trace intraventricular hemorrhage.  Intracranial mass effect with leftward midline shift 9 mm.  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  Due to worsening ICH patient placed on 3% saline until 12/06/2024 and stopped.  Patient was cleared to begin Lovenox  for DVT prophylaxis 12/07/2024.  Echocardiogram with ejection fraction of 60 to 65%.  In regards to resuming patient's chronic Xarelto  discussed with hematology services Dr. Deanne who felt patient did not have any immediate needs to resume anticoagulation or place an IVC  filter.  Patient with fevers, suspected to be central fever.  UA and chest x-ray have been negative.  Followed by speech therapy for dysphagia initially n.p.o. MBS 12/09/2024 started on a full liquid diet still receiving supplemental nutrition through nasogastric tube.  Therapy evaluations completed due to patient's decreased functional mobility was admitted for a comprehensive rehab program.  Review of Systems  Constitutional:  Positive for fever and malaise/fatigue.       Poor appetite  HENT:  Positive for hearing loss.   Eyes:  Negative for blurred vision and double vision.       Hx of cataract surgery  Respiratory:  Positive for wheezing. Negative for cough and shortness of breath.   Cardiovascular:  Positive for palpitations. Negative for chest pain.  Gastrointestinal:  Positive for constipation. Negative for heartburn, nausea and vomiting.  Genitourinary:  Negative for dysuria, flank pain and hematuria.  Musculoskeletal:  Negative for joint pain and myalgias.  Skin:  Negative for rash.  Neurological:  Positive for weakness and headaches. Negative for dizziness and sensory change.  All other systems reviewed and are negative.  Past Medical History:  Diagnosis Date   Arrhythmia    Bilateral pulmonary embolism (HCC) 05/28/2016   03/03/16   DVT of lower limb, acute (HCC)    left popliteal tibial and peroneal vein   Hypertension    Retroperitoneal bleed 12/25/2016   Cough x 1 week on Xarelto  extensive abdominal wall hematoma tracking down to penis & scrotum   Past Surgical History:  Procedure Laterality Date   INGUINAL HERNIA REPAIR Left 01/22/2022   Procedure: OPEN LEFT INGUINAL HERNIA REPAIR WITH MESH;  Surgeon: Vernetta Berg, MD;  Location: [facility];  Service: General;  Laterality:  Left;   INGUINAL HERNIA REPAIR Right 01/20/2024   Procedure: open right HERNIA REPAIR INGUINAL ADULT;  Surgeon: Vernetta Berg, MD;  Location: [facility];  Service:  General;  Laterality: Right;  LMA   KNEE SURGERY     Family History  Problem Relation Age of Onset   Stroke Father    Diabetes Father    Stroke Maternal Grandmother    Cancer Paternal Grandfather    Social History:  reports that he has never smoked. He has never used smokeless tobacco. He reports current alcohol use. He reports that he does not use drugs. Allergies: Allergies[1] Medications Prior to Admission  Medication Sig Dispense Refill   Cholecalciferol (VITAMIN D3) 1000 units CAPS Take 1,000 Units by mouth daily at 6 (six) AM.     lisinopril-hydrochlorothiazide (PRINZIDE,ZESTORETIC) 10-12.5 MG per tablet Take 1 tablet by mouth daily.     Multiple Vitamin (MULTIVITAMIN) capsule Take 1 capsule by mouth daily.     rivaroxaban  (XARELTO ) 10 MG TABS tablet TAKE 1 TABLET BY MOUTH DAILY 90 tablet 0      Home: Home Living Family/patient expects to be discharged to:: Private residence Living Arrangements: Spouse/significant other Available Help at Discharge: Family Type of Home: Independent living facility Home Access: Level entry Home Layout: One level Bathroom Shower/Tub: Health Visitor: Handicapped height Home Equipment: Medical Laboratory Scientific Officer - single point Additional Comments: lives with wife in indep apartment at [employer] With: Spouse   Functional History: Prior Function Prior Level of Function : Independent/Modified Independent Mobility Comments: no AD, drives ADLs Comments: indep, manages medication  Functional Status:  Mobility: Bed Mobility Overal bed mobility: Needs Assistance Bed Mobility: Rolling, Sidelying to Sit, Sit to Supine Rolling: Max assist, Used rails (to the L) Sidelying to sit: Max assist, +2 for physical assistance, HOB elevated, Used rails Sit to supine: Max assist, +2 for physical assistance General bed mobility comments: pt with L neglect requiring max directional verbal and tactile cues, pt unable to initiate transfer, pt with strong  resistance and pushing to the L with R UE this date Transfers Overall transfer level: Needs assistance Equipment used: 2 person hand held assist (face to face transfer with bed pad) Transfers: Sit to/from Stand Sit to Stand: +2 physical assistance, Max assist General transfer comment: maxAx2 to power up, unable to achieve full upright posture/trunk extension, L knee blocked Ambulation/Gait General Gait Details: unable this date    ADL: ADL Overall ADL's : Needs assistance/impaired Eating/Feeding: NPO Grooming: Maximal assistance Upper Body Bathing: Maximal assistance Lower Body Bathing: Total assistance Upper Body Dressing : Maximal assistance Lower Body Dressing: Total assistance Toilet Transfer: Total assistance Toileting- Clothing Manipulation and Hygiene: Total assistance Functional mobility during ADLs: Maximal assistance, +2 for safety/equipment, +2 for physical assistance General ADL Comments: limited by L weakness, R gaze, L inattention, poor balance, limited command following - no carryover of commands and no awareness of midline  Cognition: Cognition Overall Cognitive Status: Impaired/Different from baseline Arousal/Alertness: Awake/alert Orientation Level: Oriented X4 Attention: Sustained Sustained Attention: Impaired Sustained Attention Impairment: Functional basic Memory: Impaired Memory Impairment: Decreased recall of new information Awareness: Impaired Awareness Impairment: Intellectual impairment Problem Solving: Impaired Problem Solving Impairment: Functional basic Cognition Arousal: Alert (but keeps eyes closed) Behavior During Therapy: Flat affect Overall Cognitive Status: Impaired/Different from baseline  Physical Exam: Blood pressure 115/65, pulse 68, temperature 98.6 F (37 C), temperature source Axillary, resp. rate (!) 29, height 5' 10 (1.778 m), weight 86 kg, SpO2 95%.  General: No  apparent distress HEENT: Head is normocephalic, bruising on  nose, L eye. Abrasion on forehead, sclera anicteric, oral mucosa dry, Keeps eyes partially closed. NG tube in place.  Heart: Reg rate and rhythm. No murmurs rubs or gallops Chest: A little course breath sounds b/l, mild tachypnea, no distress Abdomen: Soft, non-tender, mildly-distended, bowel sounds positive. Extremities: No clubbing, cyanosis, or edema. Pulses are 2+ Psych: Pt's affect is appropriate. Pt is cooperative Skin: Warm and dry , R heel foam dressing Neuro:    Mental Status: AAO to person place and month and year but required a lot of encouragement for him to answer, delayed processing and responses, tangential at times-often tries to answer questions with Joke or story.  Drowsy appearing confused. Speech/Languate: Would not name objects today, follows simple commands.  Dysarthric speech CRANIAL NERVES: II: PERRL.  III, IV, VI: Left neglect,  does not cross midline V:  sensation decreased on left VII: Left facial weakness VIII: HOH IX, X: normal palatal elevation XI: head turn intact b/l  XII: Tongue midline   MOTOR: RUE: 4/5 Deltoid, 4/5 Biceps, 4/5 Triceps,4/5 Grip LUE: 0/5 Deltoid, 0/5 Biceps, 0/5 Triceps, 0/5 Grip RLE: HF 4/5, KE 4/5, ADF 4/5, APF 4/5 LLE: HF 2/5, KE 2/5, ADF 0/5, APF 1/5  MMT likely limited by left neglect   REFLEXES: No hyperreflexia, no ankle clonus   No hypertonia  SENSORY: Decreased to LT LUE, LLE  Coordination: finger to nose and heel to shin-limited by fatigue/cooperation  IV LUE, Picc Line RUE    Results for orders placed or performed during the hospital encounter of 12/05/24 (from the past 48 hours)  Magnesium     Status: None   Collection Time: 12/07/24  6:02 PM  Result Value Ref Range   Magnesium 2.1 1.7 - 2.4 mg/dL    Comment: Performed at [facility], [address], [place], [place] [address]  Phosphorus     Status: Abnormal   Collection Time: 12/07/24  6:02 PM  Result Value Ref Range   Phosphorus 2.2 (L) 2.5 -  4.6 mg/dL    Comment: Performed at [facility], [address] [place], [place] [address]  Sodium     Status: Abnormal   Collection Time: 12/07/24  6:02 PM  Result Value Ref Range   Sodium 158 (H) 135 - 145 mmol/L    Comment: Performed at [facility], [address] [place], [place] [address]  Glucose, capillary     Status: Abnormal   Collection Time: 12/07/24 11:18 PM  Result Value Ref Range   Glucose-Capillary 154 (H) 70 - 99 mg/dL    Comment: Glucose reference range applies only to samples taken after fasting for at least 8 hours.  Sodium     Status: Abnormal   Collection Time: 12/08/24 12:00 AM  Result Value Ref Range   Sodium 159 (H) 135 - 145 mmol/L    Comment: Performed at [facility], [address] [place], [place] [address]  Glucose, capillary     Status: Abnormal   Collection Time: 12/08/24  3:34 AM  Result Value Ref Range   Glucose-Capillary 149 (H) 70 - 99 mg/dL    Comment: Glucose reference range applies only to samples taken after fasting for at least 8 hours.  Basic metabolic panel with GFR     Status: Abnormal   Collection Time: 12/08/24  6:06 AM  Result Value Ref Range   Sodium 160 (H) 135 - 145 mmol/L  Potassium 3.4 (L) 3.5 - 5.1 mmol/L   Chloride 127 (H) 98 - 111 mmol/L   CO2 21 (L) 22 - 32 mmol/L   Glucose, Bld 139 (H) 70 - 99 mg/dL    Comment: Glucose reference range applies only to samples taken after fasting for at least 8 hours.   BUN 38 (H) 8 - 23 mg/dL   Creatinine, Ser 8.92 0.61 - 1.24 mg/dL   Calcium 8.4 (L) 8.9 - 10.3 mg/dL   GFR, Estimated >39 >39 mL/min    Comment: (NOTE) Calculated using the CKD-EPI Creatinine Equation (2021)    Anion gap 12 5 - 15    Comment: Performed at [facility], [address] [place], [place] [address]  Magnesium     Status: None   Collection Time: 12/08/24  6:06 AM  Result Value Ref Range   Magnesium 2.3 1.7 - 2.4 mg/dL    Comment: Performed at [facility], [address] [place] [address], [place], [place] [address]  Phosphorus     Status: Abnormal   Collection Time: 12/08/24  6:06 AM  Result Value Ref Range   Phosphorus 2.4 (L) 2.5 - 4.6 mg/dL    Comment: Performed at [facility], [address], [place], [place] [address]  Glucose, capillary     Status: Abnormal   Collection Time: 12/08/24  7:35 AM  Result Value Ref Range   Glucose-Capillary 136 (H) 70 - 99 mg/dL    Comment: Glucose reference range applies only to samples taken after fasting for at least 8 hours.  Sodium     Status: Abnormal   Collection Time: 12/08/24 11:51 AM  Result Value Ref Range   Sodium 157 (H) 135 - 145 mmol/L    Comment: Performed at [facility], [address] [place], [place] [address]  Glucose, capillary     Status: Abnormal   Collection Time: 12/08/24 11:56 AM  Result Value Ref Range   Glucose-Capillary 139 (H) 70 - 99 mg/dL    Comment: Glucose reference range applies only to samples taken after fasting for at least 8 hours.  Glucose, capillary     Status: Abnormal   Collection Time: 12/08/24  6:15 PM  Result Value Ref Range   Glucose-Capillary 131 (H) 70 - 99 mg/dL    Comment: Glucose reference range applies only to samples taken after fasting for at least 8 hours.   Comment 1 Notify RN    Comment 2 Document in Chart   Sodium     Status: Abnormal   Collection Time: 12/08/24  7:35 PM  Result Value Ref Range   Sodium 156 (H) 135 - 145 mmol/L    Comment: Performed at [facility], [address], [place], [place] [address]  Glucose, capillary     Status: Abnormal   Collection Time: 12/08/24  9:48 PM  Result Value Ref Range   Glucose-Capillary 133 (H) 70 - 99 mg/dL    Comment: Glucose reference range applies only to samples taken after fasting for at least 8 hours.  Sodium     Status: Abnormal   Collection Time: 12/09/24 12:05 AM  Result Value Ref Range   Sodium 157 (H) 135 - 145 mmol/L    Comment: Performed at [facility], [address]  [place], [place] [address]  Glucose, capillary     Status: Abnormal   Collection Time: 12/09/24  1:02 AM  Result Value Ref Range   Glucose-Capillary 139 (  H) 70 - 99 mg/dL    Comment: Glucose reference range applies only to samples taken after fasting for at least 8 hours.  Glucose, capillary     Status: Abnormal   Collection Time: 12/09/24  3:59 AM  Result Value Ref Range   Glucose-Capillary 112 (H) 70 - 99 mg/dL    Comment: Glucose reference range applies only to samples taken after fasting for at least 8 hours.  Magnesium     Status: None   Collection Time: 12/09/24  5:50 AM  Result Value Ref Range   Magnesium 1.9 1.7 - 2.4 mg/dL    Comment: Performed at [facility], [address] [place], [place] [address]  Phosphorus     Status: Abnormal   Collection Time: 12/09/24  5:50 AM  Result Value Ref Range   Phosphorus 2.3 (L) 2.5 - 4.6 mg/dL    Comment: Performed at [facility], [address], [place], [place] [address]  Sodium     Status: Abnormal   Collection Time: 12/09/24  5:50 AM  Result Value Ref Range   Sodium 158 (H) 135 - 145 mmol/L    Comment: Performed at [facility], [address], [place], [place] [address]  Glucose, capillary     Status: Abnormal   Collection Time: 12/09/24  7:52 AM  Result Value Ref Range   Glucose-Capillary 143 (H) 70 - 99 mg/dL    Comment: Glucose reference range applies only to samples taken after fasting for at least 8 hours.  Glucose, capillary     Status: Abnormal   Collection Time: 12/09/24 11:37 AM  Result Value Ref Range   Glucose-Capillary 148 (H) 70 - 99 mg/dL    Comment: Glucose reference range applies only to samples taken after fasting for at least 8 hours.   DG Swallowing Func-Speech Pathology Result Date: 12/09/2024 Table formatting from the original result was not included. Modified Barium Swallow Study Patient Details Name: SHRAY HUNLEY MRN: [id number] Date of Birth: 03/09/1946 Today's Date: 12/09/2024 HPI/PMH: HPI:  79 yo male presenting to ED 1/10 with headache and lethargy x24 hours as well as progressive confusion and L sided weakness s/p falls x2. CTH showed large acute R temporal parenchymal hemorrhage with moderate edema and leftward midline shift, increasing in size on repeat imaging. 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 CT Cervical Spine shows moderate multilevel cervical disc degeneration and convex curvature of the lower cervical spine. Pt initially passed the [facility] 1/10 but SLP was consulted 1/11 due to change in mentation. PMH includes DVT, PE on anticoagulation, HTN, retroperitoneal bleed, arrhythmia Clinical Impression: Pt exhibits mild oral dysphagia secondary to cognitive deficits. This impacts labial seal with limited awareness of anterior loss. Trace oral residuals are noted along his tongue and palate. The pharyngeal phase is overall functional despite chronic anterior curvature of the cervical spine (see CT 1/10) but epiglottic inversion and laryngeal elevation are complete. No penetration/aspiration occurs with all consistencies regardless of volume and rate. He masticated the 13 mm barium tablet instead of swallowing it whole but with noted esophageal retention. Given fluctuating alertness and cognitive impairment, will start with full liquids. Give meds whole with puree. Full supervision to ensure he is positioned upright and fully alert will be necessary. Expect good prognosis to advance quickly with ongoing intervention. Factors that may increase risk of adverse event in presence of aspiration Noe & Lianne 2021): Factors that may increase  risk of adverse event in presence of aspiration Noe & Lianne 2021): Reduced cognitive function; Limited mobility; Frail or deconditioned Recommendations/Plan: Swallowing Evaluation Recommendations Swallowing Evaluation Recommendations Recommendations: PO diet PO Diet Recommendation: Full liquid diet Liquid  Administration via: Spoon; Cup; Straw Medication Administration: Whole meds with puree Supervision: Full assist for feeding; Full supervision/cueing for swallowing strategies Swallowing strategies  : Minimize environmental distractions; Slow rate; Small bites/sips Postural changes: Position pt fully upright for meals Oral care recommendations: Oral care BID (2x/day) Treatment Plan Treatment Plan Treatment recommendations: Therapy as outlined in treatment plan below Follow-up recommendations: Acute inpatient rehab (3 hours/day) Functional status assessment: Patient has had a recent decline in their functional status and demonstrates the ability to make significant improvements in function in a reasonable and predictable amount of time. Treatment frequency: Min 2x/week Treatment duration: 2 weeks Interventions: Aspiration precaution training; Compensatory techniques; Patient/family education; Trials of upgraded texture/liquids Recommendations Recommendations for follow up therapy are one component of a multi-disciplinary discharge planning process, led by the attending physician.  Recommendations may be updated based on patient status, additional functional criteria and insurance authorization. Assessment: Orofacial Exam: Orofacial Exam Oral Cavity: Oral Hygiene: WFL Oral Cavity - Dentition: Adequate natural dentition Orofacial Anatomy: WFL Oral Motor/Sensory Function: WFL Anatomy: Anatomy: Suspected cervical osteophytes Boluses Administered: Boluses Administered Boluses Administered: Thin liquids (Level 0); Mildly thick liquids (Level 2, nectar thick); Moderately thick liquids (Level 3, honey thick); Puree; Solid  Oral Impairment Domain: Oral Impairment Domain Lip Closure: Escape beyond mid-chin Tongue control during bolus hold: Cohesive bolus between tongue to palatal seal Bolus preparation/mastication: Timely and efficient chewing and mashing Bolus transport/lingual motion: Brisk tongue motion Oral residue: Trace  residue lining oral structures Location of oral residue : Tongue; Palate Initiation of pharyngeal swallow : Pyriform sinuses  Pharyngeal Impairment Domain: Pharyngeal Impairment Domain Soft palate elevation: No bolus between soft palate (SP)/pharyngeal wall (PW) Laryngeal elevation: Complete superior movement of thyroid cartilage with complete approximation of arytenoids to epiglottic petiole Anterior hyoid excursion: Complete anterior movement Epiglottic movement: Complete inversion Laryngeal vestibule closure: Complete, no air/contrast in laryngeal vestibule Pharyngeal stripping wave : Present - complete Pharyngeal contraction (A/P view only): N/A Pharyngoesophageal segment opening: Partial distention/partial duration, partial obstruction of flow Tongue base retraction: Trace column of contrast or air between tongue base and PPW Pharyngeal residue: Trace residue within or on pharyngeal structures Location of pharyngeal residue: Tongue base; Valleculae  Esophageal Impairment Domain: Esophageal Impairment Domain Esophageal clearance upright position: Complete clearance, esophageal coating Pill: Pill Consistency administered: Thin liquids (Level 0) Thin liquids (Level 0): [facility] Penetration/Aspiration Scale Score: Penetration/Aspiration Scale Score 1.  Material does not enter airway: Thin liquids (Level 0); Mildly thick liquids (Level 2, nectar thick); Moderately thick liquids (Level 3, honey thick); Puree; Solid; Pill Compensatory Strategies: Compensatory Strategies Compensatory strategies: No   General Information: Caregiver present: No  Diet Prior to this Study: NPO; Cortrak/Small bore NG tube   Temperature : Normal   Respiratory Status: Tachypneic   Supplemental O2: Nasal cannula   History of Recent Intubation: No  Behavior/Cognition: Alert; Requires cueing; Lethargic/Drowsy; Cooperative; Confused Self-Feeding Abilities: Needs assist with self-feeding Baseline vocal quality/speech: Normal Volitional Cough: Able to  elicit Volitional Swallow: Able to elicit Exam Limitations: No limitations Goal Planning: Prognosis for improved oropharyngeal function: Good Barriers to Reach Goals: Cognitive deficits No data recorded Patient/Family Stated Goal: none stated Consulted and agree with results and recommendations: Patient Pain: Pain Assessment Pain Assessment: Faces Faces Pain Scale: 0 Pain Location: neck, restless sitting  EOB reporting Its just uncomfortable Pain Intervention(s): Monitored during session End of Session: Start Time:SLP Start Time (ACUTE ONLY): 1140 Stop Time: SLP Stop Time (ACUTE ONLY): 1200 Time Calculation:SLP Time Calculation (min) (ACUTE ONLY): 20 min Charges: SLP Evaluations $ SLP Speech Visit: 1 Visit SLP Evaluations $MBS Swallow: 1 Procedure $Swallowing Treatment: 1 Procedure $Speech Treatment for Individual: 1 Procedure SLP visit diagnosis: SLP Visit Diagnosis: Dysphagia, oral phase (R13.11) Past Medical History: Past Medical History: Diagnosis Date  Arrhythmia   Bilateral pulmonary embolism (HCC) 05/28/2016  03/03/16  DVT of lower limb, acute (HCC)   left popliteal tibial and peroneal vein  Hypertension   Retroperitoneal bleed 12/25/2016  Cough x 1 week on Xarelto  extensive abdominal wall hematoma tracking down to penis & scrotum Past Surgical History: Past Surgical History: Procedure Laterality Date  INGUINAL HERNIA REPAIR Left 01/22/2022  Procedure: OPEN LEFT INGUINAL HERNIA REPAIR WITH MESH;  Surgeon: Vernetta Berg, MD;  Location: [facility];  Service: General;  Laterality: Left;  INGUINAL HERNIA REPAIR Right 01/20/2024  Procedure: open right HERNIA REPAIR INGUINAL ADULT;  Surgeon: Vernetta Berg, MD;  Location: [facility];  Service: General;  Laterality: Right;  LMA  KNEE SURGERY   Damien Blumenthal, M.A., CCC-SLP Speech Language Pathology, Acute Rehabilitation Services Secure Chat preferred [phone] 12/09/2024, 1:04 PM     Blood pressure (!) 137/57, pulse 68,  temperature 99.5 F (37.5 C), temperature source Oral, resp. rate (!) 29, height 5' 10 (1.778 m), weight 86 kg, SpO2 95%.  Medical Problem List and Plan: 1. Functional deficits secondary to traumatic right temporal hemorrhage 12/05/2024 complicated by cerebral edema receiving hypertonic saline through 12/06/2024  -patient may shower , cover picc line, dressings  -ELOS/Goals: 24-28 days, PT/OT/SLP min A  -Admit to CIR 2.  Antithrombotics: -DVT/anticoagulation:  Pharmaceutical: Lovenox   -antiplatelet therapy: N/A 3. Pain Management: Tylenol  as needed 4. Mood/Behavior/Sleep: Provide emotional support  -antipsychotic agents: N/A 5. Neuropsych/cognition: This patient is not capable of making decisions on his own behalf. 6. Skin/Wound Care: Routine skin checks 7. Fluids/Electrolytes/Nutrition: Routine in and outs with follow-up chemistries 8.  History of recurrent DVT/PE.  Xarelto  reversed with Kcentra .  As per hematology services Dr.Sherill no immediate needs to resume anticoagulation or place IVC filter.  Hematology note indicates Dr. Cloretta following closely and will make further recommendations 9.  Hypertension.  Coreg  3.125 mg twice daily.  Monitor with increased mobility 10. Dysphagia.  Currently on a full liquid diet after MBS 12/09/2024.  Supplemental tube feeds with Cortrak and follow-up dietary services 11. Leukocytosis. Recheck labs tomorrow 12. Fevers. Neurology suspecting central fevers. Will recheck CXR. U/A was negative on 12/05/24  Toribio PARAS Angiulli, PA-C 12/09/2024  I have personally performed a face to face diagnostic evaluation of this patient and formulated the key components of the plan.  Additionally, I have personally reviewed laboratory data, imaging studies, as well as relevant notes and concur with the physician assistant's documentation above.  The patient's status has not changed from the original H&P.  Any changes in documentation from the acute care chart have been  noted above.  Murray Collier, MD      [1]  Allergies Allergen Reactions   Penicillins    Thimerosal (Thiomersal) Other (See Comments)   "

## 2024-12-09 NOTE — Discharge Instructions (Signed)
 Inpatient Rehab Discharge Instructions  Dwayne [name] Discharge date and time: No discharge date for patient encounter.   Activities/Precautions/ Functional Status: Activity: As tolerated Diet:  Wound Care: Routine skin checks Functional status:  ___ No restrictions     ___ Walk up steps independently ___ 24/7 supervision/assistance   ___ Walk up steps with assistance ___ Intermittent supervision/assistance  ___ Bathe/dress independently ___ Walk with walker     _x__ Bathe/dress with assistance ___ Walk Independently    ___ Shower independently ___ Walk with assistance    ___ Shower with assistance ___ No alcohol     ___ Return to work/school ________  Special Instructions: No driving smoking or alcohol  No Xarelto  until cleared by neurosurgery   My questions have been answered and I understand these instructions. I will adhere to these goals and the provided educational materials after my discharge from the hospital.  Patient/Caregiver Signature _______________________________ Date __________  Clinician Signature _______________________________________ Date __________  Please bring this form and your medication list with you to all your follow-up doctor's appointments.

## 2024-12-09 NOTE — Progress Notes (Signed)
 Occupational Therapy Treatment Patient Details Name: Dwayne [name] MRN: [id number] DOB: 1946/01/31 Today's Date: 12/09/2024   History of present illness Pt is a 79 y.o. male who presented 12/05/24 with headache and lethargy x24 hours, x2 falls, and progressive confusion and L-sided weakness. CT head showed large acute right temporal parenchymal hemorrhage with moderate edema and leftward midline shift, increasing in size on repeat imaging. 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 PMH: DVT, PE on anticoagulation, HTN, retroperitoneal bleed, arrythmia   OT comments  Patient received in supine and eager to participate with OT/PT. Patient was instructed on bed mobility with max assist +2 to get to EOB with patient assisting with using rail but difficulty releasing grasp. Patient demonstrated left lateral leaning while on EOB but was able to correct with pulling forward on back of chair and leaving RUE in lap to prevent pushing. Patient able to wash face while on EOB with supervision and verbal cues. Standing attempted with 2 person HHA requiring max assist +2 and patient with difficulty with holding up trunk. Patient performed standing in trunk with left lateral leaning but better trunk control.  Patient will benefit from intensive inpatient follow-up therapy, >3 hours/day.  Acute OT to continue to follow to address established goals to facilitate DC to next venue of care.        If plan is discharge home, recommend the following:  A lot of help with walking and/or transfers;A lot of help with bathing/dressing/bathroom;Assistance with cooking/housework;Assistance with feeding;Direct supervision/assist for medications management;Direct supervision/assist for financial management;Assist for transportation;Help with stairs or ramp for entrance;Supervision due to cognitive status   Equipment Recommendations  Other (comment) (defer)    Recommendations for Other Services      Precautions /  Restrictions Precautions Precautions: Fall Precaution/Restrictions Comments: L inattention, doesnt cross midline with tracking of eyes Restrictions Weight Bearing Restrictions Per Provider Order: No       Mobility Bed Mobility Overal bed mobility: Needs Assistance Bed Mobility: Rolling, Sidelying to Sit, Sit to Supine Rolling: Max assist, Used rails Sidelying to sit: Max assist, +2 for physical assistance, HOB elevated, Used rails   Sit to supine: Max assist, +2 for physical assistance   General bed mobility comments: difficulty with attention to left, used rail to assist with difficulty releasing rail    Transfers Overall transfer level: Needs assistance Equipment used: 2 person hand held assist, Ambulation equipment used Transfers: Sit to/from Stand Sit to Stand: +2 physical assistance, Max assist           General transfer comment: stood from EOB with max assist +2, 2 person assist with difficulty keeping trunk upright. Stedy used with patient able to correct posture with max verbal cues and mod to max assist. Patient able to stand from [employer] with mod assist +2 Transfer via Lift Equipment: Stedy   Balance Overall balance assessment: Needs assistance Sitting-balance support: Feet supported, Single extremity supported Sitting balance-Leahy Scale: Poor Sitting balance - Comments: pushing with R UE to the L, L lateral lean Postural control: Left lateral lean Standing balance support: During functional activity, Reliant on assistive device for balance, No upper extremity supported Standing balance-Leahy Scale: Poor Standing balance comment: reliant on therapist and stedy for support                           ADL either performed or assessed with clinical judgement   ADL Overall ADL's : Needs assistance/impaired  Grooming: Therapist, nutritional;Supervision/safety;Sitting;Cueing for sequencing Grooming Details (indicate cue type and reason): on EOB with cues to  stay on tasks                                    Extremity/Trunk Assessment              Vision       Perception     Praxis     Communication Communication Communication: Impaired Factors Affecting Communication: Reduced clarity of speech   Cognition Arousal: Alert (requires cues to keep eyes open) Behavior During Therapy: Flat affect Cognition: Cognition impaired   Orientation impairments: Situation Awareness: Intellectual awareness impaired Memory impairment (select all impairments): Short-term memory Attention impairment (select first level of impairment): Focused attention Executive functioning impairment (select all impairments): Initiation, Sequencing, Reasoning, Problem solving OT - Cognition Comments: tangential at times                 Following commands: Impaired Following commands impaired: Follows one step commands with increased time      Cueing   Cueing Techniques: Verbal cues, Tactile cues, Visual cues  Exercises      Shoulder Instructions       General Comments BP in supine 135/69 (89) family present and supportive    Pertinent Vitals/ Pain       Pain Assessment Pain Assessment: Faces Faces Pain Scale: No hurt Pain Intervention(s): Monitored during session  Home Living                                          Prior Functioning/Environment              Frequency  Min 2X/week        Progress Toward Goals  OT Goals(current goals can now be found in the care plan section)  Progress towards OT goals: Progressing toward goals  Acute Rehab OT Goals Patient Stated Goal: to go to rehab OT Goal Formulation: With patient/family Time For Goal Achievement: 12/21/24 Potential to Achieve Goals: Good ADL Goals Pt Will Perform Grooming: with contact guard assist;sitting Pt Will Perform Upper Body Dressing: sitting;with contact guard assist Additional ADL Goal #1: pt will visuall attend to the L  environment with mod cues during a functional task Additional ADL Goal #2: pt will use his LUE as a functional assist with mod A during ADL task  Plan      Co-evaluation    PT/OT/SLP Co-Evaluation/Treatment: Yes Reason for Co-Treatment: Complexity of the patient's impairments (multi-system involvement);For patient/therapist safety;To address functional/ADL transfers   OT goals addressed during session: ADL's and self-care      AM-PAC OT 6 Clicks Daily Activity     Outcome Measure   Help from another person eating meals?: Total Help from another person taking care of personal grooming?: A Lot Help from another person toileting, which includes using toliet, bedpan, or urinal?: Total Help from another person bathing (including washing, rinsing, drying)?: A Lot Help from another person to put on and taking off regular upper body clothing?: A Lot Help from another person to put on and taking off regular lower body clothing?: Total 6 Click Score: 9    End of Session Equipment Utilized During Treatment: Gait belt;Other (comment) Laurent)  OT Visit Diagnosis: Unsteadiness on feet (R26.81);Other abnormalities of gait and  mobility (R26.89);Muscle weakness (generalized) (M62.81);Hemiplegia and hemiparesis;Pain;Cognitive communication deficit (R41.841) Symptoms and signs involving cognitive functions: Cerebral infarction Hemiplegia - Right/Left: Left Hemiplegia - dominant/non-dominant: Non-Dominant Hemiplegia - caused by: Cerebral infarction   Activity Tolerance Patient tolerated treatment well   Patient Left in bed;with call bell/phone within reach;with bed alarm set;with family/visitor present   Nurse Communication Mobility status        Time: 1341-1406 OT Time Calculation (min): 25 min  Charges: OT General Charges $OT Visit: 1 Visit OT Treatments $Self Care/Home Management : 8-22 mins  Dick [name], OTA Acute Rehabilitation Services  Office [phone]   Dwayne [name] 12/09/2024, 3:08 PM

## 2024-12-09 NOTE — Progress Notes (Signed)
 STROKE TEAM PROGRESS NOTE    SIGNIFICANT HOSPITAL EVENTS 1/10- Admitted with ICH 1/11 - Worsening ICH and then stabilized. 3% started, PICC ordered   INTERIM HISTORY/SUBJECTIVE Seen in room with no family at the bedside.  More alert this morning and follows commands well with persistent but improving left sided neglect.   Patient medically stable to be transferred to inpatient rehab when bed available.  Blood pressure adequately controlled. Discussed with  Hematology who feel patient does not need any anticoagulation at this time OBJECTIVE  CBC    Component Value Date/Time   WBC 12.7 (H) 12/05/2024 1938   RBC 5.06 12/05/2024 1938   HGB 15.0 12/05/2024 1938   HGB 14.9 03/04/2018 0933   HCT 42.6 12/05/2024 1938   HCT 44.8 03/04/2018 0933   PLT 162 12/05/2024 1938   PLT 197 03/04/2018 0933   MCV 84.2 12/05/2024 1938   MCV 87 03/04/2018 0933   MCH 29.6 12/05/2024 1938   MCHC 35.2 12/05/2024 1938   RDW 12.6 12/05/2024 1938   RDW 13.6 03/04/2018 0933   LYMPHSABS 2.4 03/04/2018 0933   MONOABS 0.9 12/28/2016 1117   EOSABS 0.2 03/04/2018 0933   BASOSABS 0.0 03/04/2018 0933    BMET    Component Value Date/Time   NA 158 (H) 12/09/2024 1401   NA 136 03/04/2018 0933   K 3.4 (L) 12/08/2024 0606   CL 127 (H) 12/08/2024 0606   CO2 21 (L) 12/08/2024 0606   GLUCOSE 139 (H) 12/08/2024 0606   BUN 38 (H) 12/08/2024 0606   BUN 18 03/04/2018 0933   CREATININE 1.07 12/08/2024 0606   CREATININE 0.87 07/27/2015 1154   CALCIUM 8.4 (L) 12/08/2024 0606   GFRNONAA >60 12/08/2024 0606   Vitals:   12/09/24 0807 12/09/24 0810 12/09/24 1045 12/09/24 1257  BP: (!) 147/59 (!) 147/59 (!) 137/57 115/65  Pulse:  90 68   Resp:  (!) 25 (!) 29   Temp: (!) 100.5 F (38.1 C)  99.5 F (37.5 C) 98.4 F (36.9 C)  TempSrc: Oral  Oral Oral  SpO2:  94% 95%   Weight:      Height:         PHYSICAL EXAM General:  Well-nourished, well-developed elderly Caucasian male patient in no acute distress Psych:   Mood and affect appropriate for situation CV: Regular rate and rhythm on monitor Respiratory:  Regular, unlabored respirations on room air GI: Abdomen soft and nontender   NEURO:  Mental Status: Drowsy, responds to name  Speech/Language: Dysarthric, limited speech   Cranial Nerves:  II: PERRL. Visual fields full.  III, IV, VI: EOMI. Eyelids elevate symmetrically.  V: Sensation is intact to light touch and symmetrical to face.  VII: Face is symmetrical resting and smiling VIII: hearing intact to voice. IX, X: Palate elevates symmetrically. Phonation is normal.  KP:Dynloizm shrug 5/5. XII: tongue is midline without fasciculations. Motor:  RUE    LUE 3+/5 - left sided neglect- difficulty following commands in LUE and LLE RLE  LLE 4/5  Tone: is normal and bulk is normal Sensation- less response to noxious stimuli on the left, left neglect Coordination: FTN intact bilaterally, HKS: no ataxia in BLE.No drift.  Gait- deferred   ASSESSMENT/PLAN  Mr. CATLIN AYCOCK is a 79 y.o. 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  NIH on Admission 8  Intracerebral Hemorrhage:  right temporal hemorrhage  Etiology:  xarelto  related CT  Head - R temporal IPH with mod edema and 6mm shift  CTA head & neck 3mm focus enhancement in the posterior aspect of hemorrhage concerning for spot sign. 40% stenosis of L ICA CT Head- Increased size of the right hemisphere intraparenchymal hematoma, now measuring 6.4 x 5.5 cm (previously 6.0 x 5.0 cm). Leftward midline shift increased to 11 mm (previously 7 mm). CT head- Large right hemispheric parenchymal hemorrhage (estimated volume 94 mL), stable from 2145 hours yesterday along with edema, mass effect, and leftward midline shift (11 mm on axials). Possible abnormal expansion of the Right caudate/basal ganglia along the medial margin. hemorrhagic tumor not  excluded, MRI without and with contrast recommended when feasible. MRI  brain w wo -no underlying mass lesion. 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 Malcolm  Dysphagia Patient has post-stroke dysphagia, SLP consulted NPO Advance diet as tolerated  Leukocytosis WBC 12.7 Febrile 38.3 -> central fever Tylenol  @ 0900 UA negative, chest xray negative  Hospital day # 4   Patient neurological exam remains stable but still drowsiness and significant left hemiaplasia and neglect.  Continue strict blood pressure control with systolic goal below 160.  Hold anticoagulation.  Core track tube for feeding.  Mobilize out of bed.  Therapy consults.     Discussed with Dr. Cloretta hematology who feel patient does not have any immediate needs to resume anticoagulation or place IVC filter.  Recommend transfer to rehab when bed available.  Last medical hospitalist team to assume care for medical management.             Eather Popp, MD Medical Director [facility] Stroke [facility] Pager: [phone] 12/09/2024 2:57 PM    To contact Stroke Continuity provider, please refer to [email or web address]. After hours, contact General Neurology

## 2024-12-09 NOTE — Progress Notes (Signed)
 Modified Barium Swallow Study  Patient Details  Name: Dwayne [name] MRN: [id number] Date of Birth: 09/06/1946  Today's Date: 12/09/2024  Modified Barium Swallow completed.  Full report located under Chart Review in the Imaging Section.  History of Present Illness 79 yo male presenting to ED 1/10 with headache and lethargy x24 hours as well as progressive confusion and L sided weakness s/p falls x2. CTH showed large acute R temporal parenchymal hemorrhage with moderate edema and leftward midline shift, increasing in size on repeat imaging. 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 CT Cervical Spine shows moderate multilevel cervical disc degeneration and convex curvature of the lower cervical spine. Pt initially passed the [facility] 1/10 but SLP was consulted 1/11 due to change in mentation. PMH includes DVT, PE on anticoagulation, HTN, retroperitoneal bleed, arrhythmia   Clinical Impression Pt exhibits mild oral dysphagia secondary to cognitive deficits. This impacts labial seal with limited awareness of anterior loss. Trace oral residuals are noted along his tongue and palate. The pharyngeal phase is overall functional despite chronic anterior curvature of the cervical spine (see CT 1/10) but epiglottic inversion and laryngeal elevation are complete. No penetration/aspiration occurs with all consistencies regardless of volume and rate. He masticated the 13 mm barium tablet instead of swallowing it whole. Noted esophageal retention. Given fluctuating alertness and cognitive impairment, will start with full liquids. Give meds whole with puree. Full supervision to ensure he is positioned upright and fully alert will be necessary. Expect good prognosis to advance quickly with ongoing intervention.  Factors that may increase risk of adverse event in presence of aspiration Noe & Lianne 2021): Reduced cognitive function;Limited mobility;Frail or  deconditioned  Swallow Evaluation Recommendations Recommendations: PO diet PO Diet Recommendation: Full liquid diet Liquid Administration via: Spoon;Cup;Straw Medication Administration: Whole meds with puree Supervision: Full assist for feeding;Full supervision/cueing for swallowing strategies Swallowing strategies  : Minimize environmental distractions;Slow rate;Small bites/sips Postural changes: Position pt fully upright for meals Oral care recommendations: Oral care BID (2x/day)    Damien Blumenthal, M.A., CCC-SLP Speech Language Pathology, Acute Rehabilitation Services  Secure Chat preferred [phone]  12/09/2024,12:54 PM

## 2024-12-09 NOTE — Progress Notes (Signed)
 Inpatient Rehabilitation Admission Medication Review by a Pharmacist  A complete drug regimen review was completed for this patient to identify any potential clinically significant medication issues.  High Risk Drug Classes Is patient taking? Indication by Medication  Antipsychotic No   Anticoagulant Yes Lovenox - vte ppx  Antibiotic No   Opioid No   Antiplatelet No   Hypoglycemics/insulin No   Vasoactive Medication Yes Coreg - HTN   Chemotherapy No   Other No      Type of Medication Issue Identified Description of Issue Recommendation(s)  Drug Interaction(s) (clinically significant)     Duplicate Therapy     Allergy     No Medication Administration End Date     Incorrect Dose     Additional Drug Therapy Needed     Significant med changes from prior encounter (inform family/care partners about these prior to discharge).    Other       Clinically significant medication issues were identified that warrant physician communication and completion of prescribed/recommended actions by midnight of the next day:  No   Time spent performing this drug regimen review (minutes):  30   Lillymae Duet BS, PharmD, BCPS Clinical Pharmacist 12/09/2024 5:56 PM  Contact: [phone] after 3 PM

## 2024-12-09 NOTE — TOC Transition Note (Signed)
 Transition of Care [facility]) - Discharge Note   Patient Details  Name: Dwayne [name] MRN: [id number] Date of Birth: 1946-06-23  [facility] CM/SW Contact:  Andrez JULIANNA George, RN Phone Number: 12/09/2024, 1:02 PM   Clinical Narrative:     Pt can discharge to CIR today. No further needs per IP Care management..   Final next level of care: IP Rehab Facility Barriers to Discharge: No Barriers Identified   Patient Goals and CMS Choice   CMS Medicare.gov Compare Post Acute Care list provided to:: Patient Choice offered to / list presented to : Patient      Discharge Placement                       Discharge Plan and Services Additional resources added to the After Visit Summary for                                       Social Drivers of Health (SDOH) Interventions SDOH Screenings   Food Insecurity: No Food Insecurity (12/05/2024)  Housing: Unknown (12/05/2024)  Transportation Needs: No Transportation Needs (12/08/2024)  Utilities: Not At Risk (12/08/2024)  Depression (PHQ2-9): Low Risk (10/05/2024)  Tobacco Use: Low Risk (12/05/2024)     Readmission Risk Interventions     No data to display

## 2024-12-09 NOTE — Progress Notes (Signed)
 Inpatient Rehab Admissions Coordinator:   I have a bed available for this patient to admit to CIR today.  Dr. Rosemarie in agreement and [facility] aware.  I stopped by and updated pt/spouse at bedside and left a voicemail for his daughter.    Reche Lowers, PT, DPT Admissions Coordinator [phone] 12/09/2024 1:47 PM

## 2024-12-09 NOTE — Progress Notes (Signed)
 Physical Therapy Treatment Patient Details Name: Dwayne [name] MRN: [id number] DOB: 03-May-1946 Today's Date: 12/09/2024   History of Present Illness Pt is a 79 y.o. male who presented 12/05/24 with headache and lethargy x24 hours, x2 falls, and progressive confusion and L-sided weakness. CT head showed large acute right temporal parenchymal hemorrhage with moderate edema and leftward midline shift, increasing in size on repeat imaging. 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 PMH: DVT, PE on anticoagulation, HTN, retroperitoneal bleed, arrythmia    PT Comments  Pt resting in bed on arrival, agreeable to session and demonstrating steady progress towards acute goals. Pt seen in conjunction with OT to maximize pt activity tolerance and progression of OOB transfers. Pt requiring max A +2 to complete bed mobility and come to standing in stedy frame from EOB and stedy pads. Pt with continued strong L lateral lean in standing with crouched posture needing max multimodal cues to correct. Pt with improved sititng balance with pt able to self correct L lateral lean with verbal and tactile cues. Patient will benefit from intensive inpatient follow-up therapy, >3 hours/day, will continue to follow acutely.    If plan is discharge home, recommend the following: A lot of help with walking and/or transfers;A little help with bathing/dressing/bathroom;Assist for transportation;Supervision due to cognitive status;Assistance with feeding;Assistance with cooking/housework;Direct supervision/assist for medications management;Direct supervision/assist for financial management   Can travel by private vehicle        Equipment Recommendations  Other (comment)    Recommendations for Other Services       Precautions / Restrictions Precautions Precautions: Fall Precaution/Restrictions Comments: L inattention, doesnt cross midline with tracking of eyes Restrictions Weight Bearing Restrictions Per Provider  Order: No     Mobility  Bed Mobility Overal bed mobility: Needs Assistance Bed Mobility: Rolling, Sidelying to Sit, Sit to Supine Rolling: Max assist, Used rails Sidelying to sit: Max assist, +2 for physical assistance, HOB elevated, Used rails   Sit to supine: Max assist, +2 for physical assistance   General bed mobility comments: difficulty with attention to left, used rail to assist with difficulty releasing rail    Transfers Overall transfer level: Needs assistance Equipment used: 2 person hand held assist, Ambulation equipment used Transfers: Sit to/from Stand Sit to Stand: +2 physical assistance, Max assist           General transfer comment: stood from EOB with max assist +2, 2 person assist with difficulty keeping trunk upright. Stedy used with patient able to correct posture with max verbal cues and mod to max assist. Patient able to stand from [employer] with mod assist +2 Transfer via Lift Equipment: Stedy  Ambulation/Gait                   Stairs             Wheelchair Mobility     Tilt Bed    Modified Rankin (Stroke Patients Only) Modified Rankin (Stroke Patients Only) Pre-Morbid Rankin Score: No significant disability Modified Rankin: Severe disability     Balance Overall balance assessment: Needs assistance Sitting-balance support: Feet supported, Single extremity supported Sitting balance-Leahy Scale: Poor Sitting balance - Comments: pushing with R UE to the L, L lateral lean Postural control: Left lateral lean Standing balance support: During functional activity, Reliant on assistive device for balance, No upper extremity supported Standing balance-Leahy Scale: Poor Standing balance comment: reliant on therapist and stedy for support  Communication Communication Communication: Impaired Factors Affecting Communication: Reduced clarity of speech  Cognition Arousal: Alert (requires cues to  keep eyes open) Behavior During Therapy: Flat affect                             Following commands: Impaired Following commands impaired: Follows one step commands with increased time    Cueing Cueing Techniques: Verbal cues, Tactile cues, Visual cues  Exercises      General Comments General comments (skin integrity, edema, etc.): BP in supine 135/69 (89) family present and supportive      Pertinent Vitals/Pain Pain Assessment Pain Assessment: Faces Faces Pain Scale: No hurt Pain Intervention(s): Monitored during session    Home Living                          Prior Function            PT Goals (current goals can now be found in the care plan section) Acute Rehab PT Goals PT Goal Formulation: With patient/family Time For Goal Achievement: 12/21/24 Progress towards PT goals: Progressing toward goals    Frequency    Min 4X/week      PT Plan      Co-evaluation PT/OT/SLP Co-Evaluation/Treatment: Yes Reason for Co-Treatment: Complexity of the patient's impairments (multi-system involvement);For patient/therapist safety;To address functional/ADL transfers PT goals addressed during session: Mobility/safety with mobility;Balance;Proper use of DME OT goals addressed during session: ADL's and self-care      AM-PAC PT 6 Clicks Mobility   Outcome Measure  Help needed turning from your back to your side while in a flat bed without using bedrails?: A Lot Help needed moving from lying on your back to sitting on the side of a flat bed without using bedrails?: A Lot Help needed moving to and from a bed to a chair (including a wheelchair)?: A Lot Help needed standing up from a chair using your arms (e.g., wheelchair or bedside chair)?: A Lot Help needed to walk in hospital room?: Total Help needed climbing 3-5 steps with a railing? : Total 6 Click Score: 10    End of Session Equipment Utilized During Treatment: Gait belt Activity Tolerance:  Patient tolerated treatment well Patient left: in bed;with call bell/phone within reach;with bed alarm set;with family/visitor present Nurse Communication: Mobility status PT Visit Diagnosis: Unsteadiness on feet (R26.81);Other abnormalities of gait and mobility (R26.89);Muscle weakness (generalized) (M62.81);History of falling (Z91.81)     Time: 8658-8591 PT Time Calculation (min) (ACUTE ONLY): 27 min  Charges:    $Neuromuscular Re-education: 8-22 mins PT General Charges $$ ACUTE PT VISIT: 1 Visit                     Windie Marasco R. PTA Acute Rehabilitation Services Office: [phone]   Therisa CHRISTELLA Boor 12/09/2024, 3:57 PM

## 2024-12-09 NOTE — Discharge Summary (Signed)
 Physician Discharge Summary  Patient ID: Dwayne [name] MRN: [id number] DOB/AGE: 78/02/1946 77 y.o.  Admit date: 12/09/2024 Discharge date: 12/12/2024  Discharge Diagnoses:  Principal Problem:   Traumatic subdural hematoma [facility]) History of recurrent DVT/PE Cerebral edema Hypertension Dysphagia History of retroperitoneal bleed 2018  Discharged Condition: Guarded  Significant Diagnostic Studies:   Labs:  Basic Metabolic Panel: Recent Labs  Lab 12/05/24 1938 12/05/24 2310 12/06/24 0612 12/06/24 1200 12/06/24 1704 12/06/24 2341 12/07/24 0546 12/07/24 1044 12/07/24 1802 12/08/24 0000 12/08/24 0606 12/08/24 1151 12/08/24 1935 12/09/24 0005 12/09/24 0550 12/09/24 1401 12/10/24 0957 12/11/24 0428 12/12/24 1210 12/12/24 1704  NA 135 137 141 144 147* 150* 153* 156* 158* 159* 160* 157* 156* 157* 158* 158* 152* 149* 141 143  K 4.4  --   --   --   --   --   --   --   --   --  3.4*  --   --   --   --   --  3.1* 3.3* 4.0  --   CL 99  --   --   --   --   --   --   --   --   --  127*  --   --   --   --   --  117* 114* 107  --   CO2 24  --   --   --   --   --   --   --   --   --  21*  --   --   --   --   --  26 27 25   --   GLUCOSE 92  --   --   --   --   --   --   --   --   --  139*  --   --   --   --   --  138* 115* 102*  --   BUN 14  --   --   --   --   --   --   --   --   --  38*  --   --   --   --   --  37* 35* 31*  --   CREATININE 1.07  --   --   --   --   --   --   --   --   --  1.07  --   --   --   --   --  0.92 0.85 0.75  --   CALCIUM 9.2  --   --   --   --   --   --   --   --   --  8.4*  --   --   --   --   --  7.9* 7.8* 8.2*  --   MG  --   --   --   --   --   --   --   --  2.1  --  2.3  --   --   --  1.9  --   --   --   --   --   PHOS  --   --   --   --   --   --   --   --  2.2*  --  2.4*  --   --   --  2.3*  --   --   --   --   --  CBC: Recent Labs  Lab 12/05/24 1938 12/10/24 0957 12/12/24 1210  WBC 12.7* 9.9 9.9  NEUTROABS  --  6.3 6.1  HGB 15.0 11.9* 11.7*   HCT 42.6 35.9* 35.2*  MCV 84.2 90.4 88.0  PLT 162 103* 116*    CBG: Recent Labs  Lab 12/09/24 0359 12/09/24 0752 12/09/24 1137 12/09/24 1548 12/12/24 1209  GLUCAP 112* 143* 148* 169* 101*    Brief HPI:   Dwayne [name] is a 79 y.o. 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  Per chart review patient lives with spouse independent living facility/[facility].  Independent prior to admission.  Presented 12/05/2024 with headache and poor appetite as well as a fall x 2.  Denied loss of consciousness.  Cranial CT scan showed a large acute right temporal parenchymal hemorrhage with moderate edema and a 6 mm leftward midline shift.  Left frontal scalp soft tissue swelling.  CT cervical spine negative.  CT maxillofacial showed acute at most minimally displaced bilateral nasal bone fracture.  Nasal left side facial soft tissue swelling.  CTA head and neck showed no underlying aneurysm or vascular malformation.  Venous Doppler studies 12/05/2024 showed chronic DVT left femoral vein.  Patient Xarelto  was reversed with Kcentra .  Neurosurgery consulted Dr. Malcolm advised conservative care.  Hospital course 12/06/2024 patient with some altered mental status with follow-up CT/MRI showed large right hemispheric hemorrhage with surrounding confluent edema.  Small right subdural hematoma and trace intraventricular hemorrhage.  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  Due to worsening of ICH placed on 3% saline until 12/06/2024 and stopped.  Patient was cleared to begin Lovenox  for DVT prophylaxis 12/07/2024.  Echocardiogram with ejection fraction of 60 to 65%.  In regards to resuming patient's chronic Xarelto  discussed with hematology service Dr. Deanne who felt patient did not have any  immediate needs to resume anticoagulation or place an IVC filter.  Followed by speech therapy for dysphagia initially n.p.o. swallow study 12/09/2024 started on a full liquid diet as well as receiving supplemental nutrition through a nasogastric tube.  Therapy evaluations completed due to patient's decreased functional mobility was admitted for a comprehensive rehab program.   Hospital Course: Dwayne [name] was admitted to rehab 12/09/2024 for inpatient therapies to consist of PT, ST and OT at least three hours five days a week. Past admission physiatrist, therapy team and rehab RN have worked together to provide customized collaborative inpatient rehab.  Pertaining to patient's traumatic right temporal hemorrhage 12/05/2024 complicated by chronic Xarelto  with bouts of cerebral edema and did receive hypertonic saline through 12/06/2024.  Followed by neurology services conservative care by neurosurgery Dr. Malcolm.  Patient had been cleared for Lovenox  for DVT prophylaxis however in regards to recurrent history of DVT pulmonary emboli discussed with hematology services no immediate need to resume anticoagulation or place a filter at this time.  Blood pressure remained well-controlled on Coreg  3.125 mg twice daily monitor with increased mobility.  Diet was slowly advanced followed by speech therapy initially with nasogastric tube.  On the afternoon of 12/12/2024 patient with significant lethargy not verbalizing or answering questions.  CT of the head repeated showing significant increase in midline shift and left lateral ventricle dilation.  Discussed with neurosurgery Dr. Darnella recommendations to readmit to the neuro ICU for hypertonic saline/mannitol/possible surgical intervention.  Critical care medicine  consulted patient discharged to acute care services in guarded condition.   Blood pressures were monitored on TID basis and remained soft and monitored     Rehab course: During patient's stay in rehab weekly  team conferences were held to monitor patient's progress, set goals and discuss barriers to discharge. At admission, patient required +2 physical assist sit to stand max assist sit to supine  He/She  has had improvement in activity tolerance, balance, postural control as well as ability to compensate for deficits. He/She has had improvement in functional use RUE/LUE  and RLE/LLE as well as improvement in awareness.  Therapy is limited overall due to lethargy       Disposition:  Discharge disposition: 02-Transferred to [facility]        Diet: N.p.o.  Special Instructions: As per critical care  Continue to hold Xarelto  until cleared by neurosurgery/hematology  Medications at discharge. 1.  Tylenol  as needed 2.  Coreg  3.125 mg p.o. twice daily 3.  Thiamine  100 mg p.o. daily  30-35 minutes were spent completing discharge summary and discharge planning  Discharge Instructions     Ambulatory referral to Neurology   Complete by: As directed    An appointment is requested in approximately: 4 weeks traumatic SDH        Follow-up Information     Kirsteins, Prentice BRAVO, MD Follow up.   Specialty: Physical Medicine and Rehabilitation Why: Office to call for appointment Contact information: [address] [place] [address] [place] [address] [phone]         Charlott Dorn LABOR, MD Follow up.   Specialty: Internal Medicine Why: Call for appointment 1 to 2 weeks Contact information: [address] [place] [address] [phone]         Louis Shove, MD Follow up.   Specialty: Neurosurgery Why: Call for appointment Contact information: [address] [place] [address] [phone]         Cloretta Arley NOVAK, MD Follow up.   Specialty: Oncology Why: As needed Contact information: [address] [place] [address] [phone]                 Signed: Toribio PARAS Kieley Akter 12/12/2024, 7:06  PM

## 2024-12-09 NOTE — Plan of Care (Signed)

## 2024-12-09 NOTE — Care Management Important Message (Signed)
 Important Message  Patient Details  Name: Dwayne [name] MRN: [id number] Date of Birth: 07/27/1946   Important Message Given:  Yes - Medicare IM     Claretta Deed 12/09/2024, 2:54 PM

## 2024-12-09 NOTE — Progress Notes (Signed)
 Inpatient Rehabilitation  Patient information reviewed and entered into eRehab system by [facility]. Karen Kays., CCC/SLP, PPS Coordinator.  Information including medical coding, functional ability and quality indicators will be reviewed and updated through discharge.

## 2024-12-09 NOTE — Plan of Care (Signed)
  Problem: Education: Goal: Knowledge of disease or condition will improve Outcome: Progressing Goal: Knowledge of secondary prevention will improve (MUST DOCUMENT ALL) Outcome: Progressing Goal: Knowledge of patient specific risk factors will improve (DELETE if not current risk factor) Outcome: Progressing   Problem: Intracerebral Hemorrhage Tissue Perfusion: Goal: Complications of Intracerebral Hemorrhage will be minimized Outcome: Progressing   Problem: Coping: Goal: Will verbalize positive feelings about self Outcome: Progressing Goal: Will identify appropriate support needs Outcome: Progressing   Problem: Health Behavior/Discharge Planning: Goal: Ability to manage health-related needs will improve Outcome: Progressing Goal: Goals will be collaboratively established with patient/family Outcome: Progressing   Problem: Self-Care: Goal: Ability to participate in self-care as condition permits will improve Outcome: Progressing Goal: Verbalization of feelings and concerns over difficulty with self-care will improve Outcome: Progressing Goal: Ability to communicate needs accurately will improve Outcome: Progressing   Problem: Nutrition: Goal: Risk of aspiration will decrease Outcome: Progressing Goal: Dietary intake will improve Outcome: Progressing

## 2024-12-09 NOTE — H&P (Addendum)
 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  Per chart review patient lives with spouse independent living facility/[facility].  1 level home with level entry.  Presented 12/05/2024 with headache and poor appetite as well as a fall while going to the bathroom x 2.  Denied loss of consciousness..  Cranial CT scan showed large acute right temporal parenchymal hemorrhage with moderate edema and a 6 mm leftward midline shift.  Left frontal scalp soft tissue swelling.  CT cervical spine negative.  CT maxillofacial showed acute at most minimally displaced bilateral nasal bone fractures.  Nasal and left-sided facial soft tissue swelling.  CTA head and neck showed no underlying aneurysm or vascular malformation identified.  Venous Doppler studies 12/05/2024 showed chronic DVT left femoral vein.  Patient's Xarelto  was reversed with Kcentra .  Neurosurgery consulted/Dr. Louis advised conservative care.  Hospital course 12/06/2024 patient with some altered mental status with follow-up CT/MRI showed large right hemispheric hemorrhage with surrounding confluent edema.  Small right subdural hematoma and trace intraventricular hemorrhage.  Intracranial mass effect with leftward midline shift 9 mm.  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  Due to worsening ICH patient placed on 3% saline until 12/06/2024 and stopped.  Patient was cleared to begin Lovenox  for DVT prophylaxis 12/07/2024.  Echocardiogram with ejection fraction of 60 to 65%.  In regards to resuming patient's chronic Xarelto  discussed with hematology services Dr. Deanne who felt patient did not have any immediate needs to resume anticoagulation or place  an IVC filter.  Patient with fevers, suspected to be central fever.  UA and chest x-ray have been negative.  Followed by speech therapy for dysphagia initially n.p.o. MBS 12/09/2024 started on a full liquid diet still receiving supplemental nutrition through nasogastric tube.  Therapy evaluations completed due to patient's decreased functional mobility was admitted for a comprehensive rehab program.   Review of Systems  Constitutional:  Positive for fever and malaise/fatigue.       Poor appetite  HENT:  Positive for hearing loss.   Eyes:  Negative for blurred vision and double vision.       Hx of cataract surgery  Respiratory:  Positive for wheezing. Negative for cough and shortness of breath.   Cardiovascular:  Positive for palpitations. Negative for chest pain.  Gastrointestinal:  Positive for constipation. Negative for heartburn, nausea and vomiting.  Genitourinary:  Negative for dysuria, flank pain and hematuria.  Musculoskeletal:  Negative for joint pain and myalgias.  Skin:  Negative for rash.  Neurological:  Positive for weakness and headaches. Negative for dizziness and sensory change.  All other systems reviewed and are negative.      Past Medical History:  Diagnosis Date   Arrhythmia     Bilateral pulmonary embolism (HCC) 05/28/2016    03/03/16   DVT of lower limb, acute (HCC)      left popliteal tibial and peroneal vein   Hypertension     Retroperitoneal bleed 12/25/2016    Cough x 1 week on Xarelto  extensive abdominal wall hematoma tracking down to penis & scrotum             Past Surgical History:  Procedure Laterality Date   INGUINAL HERNIA REPAIR  Left 01/22/2022    Procedure: OPEN LEFT INGUINAL HERNIA REPAIR WITH MESH;  Surgeon: Vernetta Berg, MD;  Location: [facility];  Service: General;  Laterality: Left;   INGUINAL HERNIA REPAIR Right 01/20/2024    Procedure: open right HERNIA REPAIR INGUINAL ADULT;  Surgeon: Vernetta Berg, MD;  Location: [facility];  Service: General;  Laterality: Right;  LMA   KNEE SURGERY                 Family History  Problem Relation Age of Onset   Stroke Father     Diabetes Father     Stroke Maternal Grandmother     Cancer Paternal Grandfather          Social History:  reports that he has never smoked. He has never used smokeless tobacco. He reports current alcohol use. He reports that he does not use drugs. Allergies: [Allergies]  [Allergies]     Allergen Reactions   Penicillins     Thimerosal (Thiomersal) Other (See Comments)         Medications Prior to Admission  Medication Sig Dispense Refill   Cholecalciferol (VITAMIN D3) 1000 units CAPS Take 1,000 Units by mouth daily at 6 (six) AM.       lisinopril-hydrochlorothiazide (PRINZIDE,ZESTORETIC) 10-12.5 MG per tablet Take 1 tablet by mouth daily.       Multiple Vitamin (MULTIVITAMIN) capsule Take 1 capsule by mouth daily.       rivaroxaban  (XARELTO ) 10 MG TABS tablet TAKE 1 TABLET BY MOUTH DAILY 90 tablet 0              Home: Home Living Family/patient expects to be discharged to:: Private residence Living Arrangements: Spouse/significant other Available Help at Discharge: Family Type of Home: Independent living facility Home Access: Level entry Home Layout: One level Bathroom Shower/Tub: Health Visitor: Handicapped height Home Equipment: Medical Laboratory Scientific Officer - single point Additional Comments: lives with wife in indep apartment at [employer] With: Spouse   Functional History: Prior Function Prior Level of Function : Independent/Modified Independent Mobility Comments: no AD, drives ADLs Comments: indep, manages medication   Functional Status:  Mobility: Bed Mobility Overal bed mobility: Needs Assistance Bed Mobility: Rolling, Sidelying to Sit, Sit to Supine Rolling: Max assist, Used rails (to the L) Sidelying to sit: Max assist, +2 for physical assistance, HOB elevated, Used rails Sit to supine:  Max assist, +2 for physical assistance General bed mobility comments: pt with L neglect requiring max directional verbal and tactile cues, pt unable to initiate transfer, pt with strong resistance and pushing to the L with R UE this date Transfers Overall transfer level: Needs assistance Equipment used: 2 person hand held assist (face to face transfer with bed pad) Transfers: Sit to/from Stand Sit to Stand: +2 physical assistance, Max assist General transfer comment: maxAx2 to power up, unable to achieve full upright posture/trunk extension, L knee blocked Ambulation/Gait General Gait Details: unable this date   ADL: ADL Overall ADL's : Needs assistance/impaired Eating/Feeding: NPO Grooming: Maximal assistance Upper Body Bathing: Maximal assistance Lower Body Bathing: Total assistance Upper Body Dressing : Maximal assistance Lower Body Dressing: Total assistance Toilet Transfer: Total assistance Toileting- Clothing Manipulation and Hygiene: Total assistance Functional mobility during ADLs: Maximal assistance, +2 for safety/equipment, +2 for physical assistance General ADL Comments: limited by L weakness, R gaze, L inattention, poor balance, limited command following - no carryover of commands and no awareness of midline   Cognition: Cognition Overall Cognitive  Status: Impaired/Different from baseline Arousal/Alertness: Awake/alert Orientation Level: Oriented X4 Attention: Sustained Sustained Attention: Impaired Sustained Attention Impairment: Functional basic Memory: Impaired Memory Impairment: Decreased recall of new information Awareness: Impaired Awareness Impairment: Intellectual impairment Problem Solving: Impaired Problem Solving Impairment: Functional basic Cognition Arousal: Alert (but keeps eyes closed) Behavior During Therapy: Flat affect Overall Cognitive Status: Impaired/Different from baseline   Physical Exam: Blood pressure 115/65, pulse 68, temperature 98.6  F (37 C), temperature source Axillary, resp. rate (!) 29, height 5' 10 (1.778 m), weight 86 kg, SpO2 95%.   General: No apparent distress HEENT: Head is normocephalic, bruising on nose, L eye. Abrasion on forehead, sclera anicteric, oral mucosa dry, Keeps eyes partially closed. NG tube in place.  Heart: Reg rate and rhythm. No murmurs rubs or gallops Chest: A little course breath sounds b/l, mild tachypnea, no distress Abdomen: Soft, non-tender, mildly-distended, bowel sounds positive. Extremities: No clubbing, cyanosis, or edema. Pulses are 2+ Psych: Pt's affect is appropriate. Pt is cooperative Skin: Warm and dry , R heel foam dressing Neuro:     Mental Status: AAO to person place and month and year but required a lot of encouragement for him to answer, delayed processing and responses, tangential at times-often tries to answer questions with Joke or story.  Drowsy appearing confused. Speech/Languate: Would not name objects today, follows simple commands.  Dysarthric speech CRANIAL NERVES: II: PERRL.  III, IV, VI: Left neglect,  does not cross midline V:  sensation decreased on left VII: Left facial weakness VIII: HOH IX, X: normal palatal elevation XI: head turn intact b/l  XII: Tongue midline     MOTOR: RUE: 4/5 Deltoid, 4/5 Biceps, 4/5 Triceps,4/5 Grip LUE: 0/5 Deltoid, 0/5 Biceps, 0/5 Triceps, 0/5 Grip RLE: HF 4/5, KE 4/5, ADF 4/5, APF 4/5 LLE: HF 2/5, KE 2/5, ADF 0/5, APF 1/5   MMT likely limited by left neglect     REFLEXES: No hyperreflexia, no ankle clonus    No hypertonia   SENSORY: Decreased to LT LUE, LLE   Coordination: finger to nose and heel to shin-limited by fatigue/cooperation   IV LUE, Picc Line RUE       Lab Results Last 48 Hours        Results for orders placed or performed during the hospital encounter of 12/05/24 (from the past 48 hours)  Magnesium     Status: None    Collection Time: 12/07/24  6:02 PM  Result Value Ref Range     Magnesium 2.1 1.7 - 2.4 mg/dL      Comment: Performed at [facility], [address] [place], [place] [address]  Phosphorus     Status: Abnormal    Collection Time: 12/07/24  6:02 PM  Result Value Ref Range    Phosphorus 2.2 (L) 2.5 - 4.6 mg/dL      Comment: Performed at [facility], [address] [place], [place] [address]  Sodium     Status: Abnormal    Collection Time: 12/07/24  6:02 PM  Result Value Ref Range    Sodium 158 (H) 135 - 145 mmol/L      Comment: Performed at [facility], [address] [place], [place] [address]  Glucose, capillary     Status: Abnormal    Collection Time: 12/07/24 11:18 PM  Result Value Ref Range    Glucose-Capillary 154 (H) 70 - 99 mg/dL      Comment: Glucose reference range applies only to samples taken  after fasting for at least 8 hours.  Sodium     Status: Abnormal    Collection Time: 12/08/24 12:00 AM  Result Value Ref Range    Sodium 159 (H) 135 - 145 mmol/L      Comment: Performed at [facility], [address] [place], [place] [address]  Glucose, capillary     Status: Abnormal    Collection Time: 12/08/24  3:34 AM  Result Value Ref Range    Glucose-Capillary 149 (H) 70 - 99 mg/dL      Comment: Glucose reference range applies only to samples taken after fasting for at least 8 hours.  Basic metabolic panel with GFR     Status: Abnormal    Collection Time: 12/08/24  6:06 AM  Result Value Ref Range    Sodium 160 (H) 135 - 145 mmol/L    Potassium 3.4 (L) 3.5 - 5.1 mmol/L    Chloride 127 (H) 98 - 111 mmol/L    CO2 21 (L) 22 - 32 mmol/L    Glucose, Bld 139 (H) 70 - 99 mg/dL      Comment: Glucose reference range applies only to samples taken after fasting for at least 8 hours.    BUN 38 (H) 8 - 23 mg/dL    Creatinine, Ser 8.92 0.61 - 1.24 mg/dL    Calcium 8.4 (L) 8.9 - 10.3 mg/dL    GFR, Estimated >39 >39 mL/min      Comment: (NOTE) Calculated using the CKD-EPI Creatinine Equation (2021)      Anion gap 12 5 -  15      Comment: Performed at [facility], [address], [place], [place] [address]  Magnesium     Status: None    Collection Time: 12/08/24  6:06 AM  Result Value Ref Range    Magnesium 2.3 1.7 - 2.4 mg/dL      Comment: Performed at [facility], [address], [place], [place] [address]  Phosphorus     Status: Abnormal    Collection Time: 12/08/24  6:06 AM  Result Value Ref Range    Phosphorus 2.4 (L) 2.5 - 4.6 mg/dL      Comment: Performed at [facility], [address], [place], [place] [address]  Glucose, capillary     Status: Abnormal    Collection Time: 12/08/24  7:35 AM  Result Value Ref Range    Glucose-Capillary 136 (H) 70 - 99 mg/dL      Comment: Glucose reference range applies only to samples taken after fasting for at least 8 hours.  Sodium     Status: Abnormal    Collection Time: 12/08/24 11:51 AM  Result Value Ref Range    Sodium 157 (H) 135 - 145 mmol/L      Comment: Performed at [facility], [address], [place], [place] [address]  Glucose, capillary     Status: Abnormal    Collection Time: 12/08/24 11:56 AM  Result Value Ref Range    Glucose-Capillary 139 (H) 70 - 99 mg/dL      Comment: Glucose reference range applies only to samples taken after fasting for at least 8 hours.  Glucose, capillary     Status: Abnormal    Collection Time: 12/08/24  6:15 PM  Result Value Ref Range    Glucose-Capillary 131 (H) 70 - 99 mg/dL      Comment: Glucose reference range applies only to samples taken after fasting for  at least 8 hours.    Comment 1 Notify RN      Comment 2 Document in Chart    Sodium     Status: Abnormal    Collection Time: 12/08/24  7:35 PM  Result Value Ref Range    Sodium 156 (H) 135 - 145 mmol/L      Comment: Performed at [facility], [address] [place], [place] [address]  Glucose, capillary     Status: Abnormal    Collection Time: 12/08/24  9:48 PM  Result Value Ref Range    Glucose-Capillary 133 (H) 70 -  99 mg/dL      Comment: Glucose reference range applies only to samples taken after fasting for at least 8 hours.  Sodium     Status: Abnormal    Collection Time: 12/09/24 12:05 AM  Result Value Ref Range    Sodium 157 (H) 135 - 145 mmol/L      Comment: Performed at [facility], [address], [place], [place] [address]  Glucose, capillary     Status: Abnormal    Collection Time: 12/09/24  1:02 AM  Result Value Ref Range    Glucose-Capillary 139 (H) 70 - 99 mg/dL      Comment: Glucose reference range applies only to samples taken after fasting for at least 8 hours.  Glucose, capillary     Status: Abnormal    Collection Time: 12/09/24  3:59 AM  Result Value Ref Range    Glucose-Capillary 112 (H) 70 - 99 mg/dL      Comment: Glucose reference range applies only to samples taken after fasting for at least 8 hours.  Magnesium     Status: None    Collection Time: 12/09/24  5:50 AM  Result Value Ref Range    Magnesium 1.9 1.7 - 2.4 mg/dL      Comment: Performed at [facility], [address] [place], [place] [address]  Phosphorus     Status: Abnormal    Collection Time: 12/09/24  5:50 AM  Result Value Ref Range    Phosphorus 2.3 (L) 2.5 - 4.6 mg/dL      Comment: Performed at [facility], [address], [place], [place] [address]  Sodium     Status: Abnormal    Collection Time: 12/09/24  5:50 AM  Result Value Ref Range    Sodium 158 (H) 135 - 145 mmol/L      Comment: Performed at [facility], [address] [place], [place] [address]  Glucose, capillary     Status: Abnormal    Collection Time: 12/09/24  7:52 AM  Result Value Ref Range    Glucose-Capillary 143 (H) 70 - 99 mg/dL      Comment: Glucose reference range applies only to samples taken after fasting for at least 8 hours.  Glucose, capillary     Status: Abnormal    Collection Time: 12/09/24 11:37 AM  Result Value Ref Range    Glucose-Capillary 148 (H) 70 - 99 mg/dL      Comment: Glucose reference  range applies only to samples taken after fasting for at least 8 hours.      Imaging Results (Last 48 hours)  DG Swallowing Func-Speech Pathology Result Date: 12/09/2024 Table formatting from the original result was not included. Modified Barium Swallow Study Patient Details Name: Dwayne [name] MRN: [id number] Date of Birth: 17-Jul-1946 Today's Date: 12/09/2024 HPI/PMH: HPI: 79 yo male presenting to  ED 1/10 with headache and lethargy x24 hours as well as progressive confusion and L sided weakness s/p falls x2. CTH showed large acute R temporal parenchymal hemorrhage with moderate edema and leftward midline shift, increasing in size on repeat imaging. 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 CT Cervical Spine shows moderate multilevel cervical disc degeneration and convex curvature of the lower cervical spine. Pt initially passed the [facility] 1/10 but SLP was consulted 1/11 due to change in mentation. PMH includes DVT, PE on anticoagulation, HTN, retroperitoneal bleed, arrhythmia Clinical Impression: Pt exhibits mild oral dysphagia secondary to cognitive deficits. This impacts labial seal with limited awareness of anterior loss. Trace oral residuals are noted along his tongue and palate. The pharyngeal phase is overall functional despite chronic anterior curvature of the cervical spine (see CT 1/10) but epiglottic inversion and laryngeal elevation are complete. No penetration/aspiration occurs with all consistencies regardless of volume and rate. He masticated the 13 mm barium tablet instead of swallowing it whole but with noted esophageal retention. Given fluctuating alertness and cognitive impairment, will start with full liquids. Give meds whole with puree. Full supervision to ensure he is positioned upright and fully alert will be necessary. Expect good prognosis to advance quickly with ongoing intervention. Factors that may increase risk of adverse event in presence of  aspiration Noe & Lianne 2021): Factors that may increase risk of adverse event in presence of aspiration Noe & Lianne 2021): Reduced cognitive function; Limited mobility; Frail or deconditioned Recommendations/Plan: Swallowing Evaluation Recommendations Swallowing Evaluation Recommendations Recommendations: PO diet PO Diet Recommendation: Full liquid diet Liquid Administration via: Spoon; Cup; Straw Medication Administration: Whole meds with puree Supervision: Full assist for feeding; Full supervision/cueing for swallowing strategies Swallowing strategies  : Minimize environmental distractions; Slow rate; Small bites/sips Postural changes: Position pt fully upright for meals Oral care recommendations: Oral care BID (2x/day) Treatment Plan Treatment Plan Treatment recommendations: Therapy as outlined in treatment plan below Follow-up recommendations: Acute inpatient rehab (3 hours/day) Functional status assessment: Patient has had a recent decline in their functional status and demonstrates the ability to make significant improvements in function in a reasonable and predictable amount of time. Treatment frequency: Min 2x/week Treatment duration: 2 weeks Interventions: Aspiration precaution training; Compensatory techniques; Patient/family education; Trials of upgraded texture/liquids Recommendations Recommendations for follow up therapy are one component of a multi-disciplinary discharge planning process, led by the attending physician.  Recommendations may be updated based on patient status, additional functional criteria and insurance authorization. Assessment: Orofacial Exam: Orofacial Exam Oral Cavity: Oral Hygiene: WFL Oral Cavity - Dentition: Adequate natural dentition Orofacial Anatomy: WFL Oral Motor/Sensory Function: WFL Anatomy: Anatomy: Suspected cervical osteophytes Boluses Administered: Boluses Administered Boluses Administered: Thin liquids (Level 0); Mildly thick liquids (Level 2, nectar  thick); Moderately thick liquids (Level 3, honey thick); Puree; Solid  Oral Impairment Domain: Oral Impairment Domain Lip Closure: Escape beyond mid-chin Tongue control during bolus hold: Cohesive bolus between tongue to palatal seal Bolus preparation/mastication: Timely and efficient chewing and mashing Bolus transport/lingual motion: Brisk tongue motion Oral residue: Trace residue lining oral structures Location of oral residue : Tongue; Palate Initiation of pharyngeal swallow : Pyriform sinuses  Pharyngeal Impairment Domain: Pharyngeal Impairment Domain Soft palate elevation: No bolus between soft palate (SP)/pharyngeal wall (PW) Laryngeal elevation: Complete superior movement of thyroid cartilage with complete approximation of arytenoids to epiglottic petiole Anterior hyoid excursion: Complete anterior movement Epiglottic movement: Complete inversion Laryngeal vestibule closure: Complete, no air/contrast in laryngeal  vestibule Pharyngeal stripping wave : Present - complete Pharyngeal contraction (A/P view only): N/A Pharyngoesophageal segment opening: Partial distention/partial duration, partial obstruction of flow Tongue base retraction: Trace column of contrast or air between tongue base and PPW Pharyngeal residue: Trace residue within or on pharyngeal structures Location of pharyngeal residue: Tongue base; Valleculae  Esophageal Impairment Domain: Esophageal Impairment Domain Esophageal clearance upright position: Complete clearance, esophageal coating Pill: Pill Consistency administered: Thin liquids (Level 0) Thin liquids (Level 0): [facility] Penetration/Aspiration Scale Score: Penetration/Aspiration Scale Score 1.  Material does not enter airway: Thin liquids (Level 0); Mildly thick liquids (Level 2, nectar thick); Moderately thick liquids (Level 3, honey thick); Puree; Solid; Pill Compensatory Strategies: Compensatory Strategies Compensatory strategies: No   General Information: Caregiver present: No  Diet  Prior to this Study: NPO; Cortrak/Small bore NG tube   Temperature : Normal   Respiratory Status: Tachypneic   Supplemental O2: Nasal cannula   History of Recent Intubation: No  Behavior/Cognition: Alert; Requires cueing; Lethargic/Drowsy; Cooperative; Confused Self-Feeding Abilities: Needs assist with self-feeding Baseline vocal quality/speech: Normal Volitional Cough: Able to elicit Volitional Swallow: Able to elicit Exam Limitations: No limitations Goal Planning: Prognosis for improved oropharyngeal function: Good Barriers to Reach Goals: Cognitive deficits No data recorded Patient/Family Stated Goal: none stated Consulted and agree with results and recommendations: Patient Pain: Pain Assessment Pain Assessment: Faces Faces Pain Scale: 0 Pain Location: neck, restless sitting EOB reporting Its just uncomfortable Pain Intervention(s): Monitored during session End of Session: Start Time:SLP Start Time (ACUTE ONLY): 1140 Stop Time: SLP Stop Time (ACUTE ONLY): 1200 Time Calculation:SLP Time Calculation (min) (ACUTE ONLY): 20 min Charges: SLP Evaluations $ SLP Speech Visit: 1 Visit SLP Evaluations $MBS Swallow: 1 Procedure $Swallowing Treatment: 1 Procedure $Speech Treatment for Individual: 1 Procedure SLP visit diagnosis: SLP Visit Diagnosis: Dysphagia, oral phase (R13.11) Past Medical History: Past Medical History: Diagnosis Date  Arrhythmia   Bilateral pulmonary embolism (HCC) 05/28/2016  03/03/16  DVT of lower limb, acute (HCC)   left popliteal tibial and peroneal vein  Hypertension   Retroperitoneal bleed 12/25/2016  Cough x 1 week on Xarelto  extensive abdominal wall hematoma tracking down to penis & scrotum Past Surgical History: Past Surgical History: Procedure Laterality Date  INGUINAL HERNIA REPAIR Left 01/22/2022  Procedure: OPEN LEFT INGUINAL HERNIA REPAIR WITH MESH;  Surgeon: Vernetta Berg, MD;  Location: [facility];  Service: General;  Laterality: Left;  INGUINAL HERNIA REPAIR Right  01/20/2024  Procedure: open right HERNIA REPAIR INGUINAL ADULT;  Surgeon: Vernetta Berg, MD;  Location: [facility];  Service: General;  Laterality: Right;  LMA  KNEE SURGERY   Damien Blumenthal, M.A., CCC-SLP Speech Language Pathology, Acute Rehabilitation Services Secure Chat preferred [phone] 12/09/2024, 1:04 PM           Blood pressure (!) 137/57, pulse 68, temperature 99.5 F (37.5 C), temperature source Oral, resp. rate (!) 29, height 5' 10 (1.778 m), weight 86 kg, SpO2 95%.   Medical Problem List and Plan: 1. 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 Pt was on xarelto  at time of the hemorrhage.              -patient may shower , cover picc line, dressings             -ELOS/Goals: 24-28 days, PT/OT/SLP min A             -Admit to CIR 2.  Antithrombotics: -DVT/anticoagulation:  Pharmaceutical: Lovenox              -antiplatelet therapy: N/A 3. Pain Management: Tylenol  as needed 4. Mood/Behavior/Sleep: Provide emotional support             -antipsychotic agents: N/A 5. Neuropsych/cognition: This patient is not capable of making decisions on his own behalf. 6. Skin/Wound Care: Routine skin checks 7. Fluids/Electrolytes/Nutrition: Routine in and outs with follow-up chemistries 8.  History of recurrent DVT/PE.  Xarelto  reversed with Kcentra .  As per hematology services Dr.Sherill no immediate needs to resume anticoagulation or place IVC filter.  Hematology note indicates Dr. Cloretta following closely and will make further recommendations 9.  Hypertension.  Coreg  3.125 mg twice daily.  Monitor with increased mobility 10. Dysphagia.  Currently on a full liquid diet after MBS 12/09/2024.  Supplemental tube feeds with Cortrak and follow-up dietary services 11. Leukocytosis. Recheck labs tomorrow, last WBC 12.7 on 1/10 12. Fevers. Neurology suspecting central  fevers. Will recheck CXR. U/A was negative on 12/05/24   Toribio PARAS Angiulli, PA-C 12/09/2024   I have personally performed a face to face diagnostic evaluation of this patient and formulated the key components of the plan.  Additionally, I have personally reviewed laboratory data, imaging studies, as well as relevant notes and concur with the physician assistant's documentation above.   The patient's status has not changed from the original H&P.  Any changes in documentation from the acute care chart have been noted above.   Murray Collier, MD "

## 2024-12-09 NOTE — Progress Notes (Addendum)
 Dwayne [name]   DOB:1946/03/01   FM#:[id number]      CLINICAL SUMMARY:  Dwayne [name] is a 79 year old male patient who presented on 12/05/2024 after falls with head trauma and diagnosed with intracerebral hemorrhage.   Hematologic history is significant for recurrent PE and DVTs, has been on chronic anticoagulation with Xarelto .  Hematology following.   ASSESSMENT & PLAN:   Intracerebral hemorrhage (ICH), right temporal hemorrhage Stroke Bilateral nasal bone fractures Altered mental status - CT head done 12/05/2024 shows large acute right temporal parenchymal hemorrhage. - Imaging also shows acute at most minimally displaced bilateral nasal bone fractures. - Neurology following. - Patient being transferred to inpatient rehab. - Continue supportive care  Venous thromboembolism History of DVT, LLE - Followed by outpatient hematology since 2017. - Has been on chronic anticoagulation with Xarelto .   - Anticoagulation was reversed in the ED with Kcentra . - Now anticoagulation on hold. - Hematology/Dr. Cloretta following closely and will make further recommendations.    Code Status Full   Subjective:  Patient seen laying supine in bed.  Patient is noted to be very confused, states that Magic is coming out of the wall.  Left eye closed during assessment, periorbital bruising noted.  Opens right eye slightly.  No family members at bedside.  No acute distress is noted.  Objective:   Intake/Output Summary (Last 24 hours) at 12/09/2024 1339 Last data filed at 12/09/2024 0849 Gross per 24 hour  Intake 100 ml  Output 600 ml  Net -500 ml     PHYSICAL EXAMINATION: ECOG PERFORMANCE STATUS: 4 - Bedbound  Vitals:   12/09/24 1045 12/09/24 1257  BP: (!) 137/57 115/65  Pulse: 68   Resp: (!) 29   Temp: 99.5 F (37.5 C) 98.4 F (36.9 C)  SpO2: 95%    Filed Weights   12/05/24 1035 12/08/24 0500 12/09/24 0630  Weight: 166 lb (75.3 kg) 168 lb 6.9 oz (76.4 kg) 189 lb 9.5 oz (86  kg)    GENERAL: + Altered mentation + chronically ill-appearing SKIN: + Pale skin color, texture, turgor are normal, no rashes or significant lesions EYES: + Left periorbital bruising OROPHARYNX: no exudate, no erythema and lips, buccal mucosa, and tongue normal  NECK: supple, thyroid normal size, non-tender, without nodularity LYMPH: no palpable lymphadenopathy in the cervical, axillary or inguinal LUNGS: clear to auscultation and percussion with normal breathing effort HEART: regular rate & rhythm and no murmurs and no lower extremity edema ABDOMEN: abdomen soft, non-tender and normal bowel sounds MUSCULOSKELETAL: no cyanosis of digits and no clubbing  PSYCH: + AMS  Neurologic: Not moving the left arm or hand, moves the left leg, the motor exam appears intact in the right upper and lower extremity, he speaks, appears confused, left-sided neglect   All questions were answered. The patient knows to call the clinic with any problems, questions or concerns.   I personally spent a total of 40 minutes minutes in the care of the patient today including preparing to see the patient, performing a medically appropriate exam/evaluation, referring and communicating with other health care professionals, documenting clinical information in the EHR, communicating results, and coordinating care.    Olam PARAS Rouson, NP 12/09/2024 1:39 PM    Labs Reviewed:  Lab Results  Component Value Date   WBC 12.7 (H) 12/05/2024   HGB 15.0 12/05/2024   HCT 42.6 12/05/2024   MCV 84.2 12/05/2024   PLT 162 12/05/2024   Recent Labs    12/05/24 1015 12/05/24 1024  Dec 23, 2024 1938 12-23-2024 2310 12/08/24 0606 12/08/24 1151 12/08/24 1935 12/09/24 0005 12/09/24 0550  NA 134* 136 135   < > 160*   < > 156* 157* 158*  K 4.9 4.2 4.4  --  3.4*  --   --   --   --   CL 97* 98 99  --  127*  --   --   --   --   CO2 24  --  24  --  21*  --   --   --   --   GLUCOSE 98 103* 92  --  139*  --   --   --   --   BUN 14 18 14    --  38*  --   --   --   --   CREATININE 1.09 1.10 1.07  --  1.07  --   --   --   --   CALCIUM 9.4  --  9.2  --  8.4*  --   --   --   --   GFRNONAA >60  --  >60  --  >60  --   --   --   --   PROT 7.7  --  7.0  --   --   --   --   --   --   ALBUMIN 4.2  --  3.9  --   --   --   --   --   --   AST 77*  --  72*  --   --   --   --   --   --   ALT 16  --  18  --   --   --   --   --   --   ALKPHOS 69  --  67  --   --   --   --   --   --   BILITOT 1.1  --  1.1  --   --   --   --   --   --    < > = values in this interval not displayed.    Studies Reviewed:  CTA head neck December 23, 2024: IMPRESSION: 1. Large right temporal lobe hemorrhage with mass effect on the right PCA and right MCA branch vessels. A 3 mm focus of enhancement in the posterior aspect of the hemorrhage is suggestive of a spot sign which increases the risk of further hematoma expansion. No underlying aneurysm or vascular malformation is identified. 2. 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   Mr. Wile was interviewed and examined.  His family was not present when I saw him at approximately 6:30 AM.  He has a persistent left hemiplegia.  He was admitted 12/23/2024 with a large right hemisphere hemorrhage.  Xarelto  anticoagulation was reversed.  He has been evaluated by neurology and neurosurgery during this hospital admission.  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  This likely resulted in falls.  Mr Adams has been maintained on chronic anticoagulation therapy after developing a post surgical left lower extremity DVT in August 2016 followed by pulmonary emboli and a left leg DVT while off of anticoagulation in April 2017.  He began reduced intensity anticoagulation in January 2018.  While he was still on full dose anticoagulation he had a spontaneous rectus sheath hematoma in January 2018.  Anticoagulation was resumed at reduced intensity.  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  Both episodes of venous thromboembolic disease occurred during the year following a postoperative DVT.  It is possible the left lower extremity DVT was incompletely treated with the initial 3 months of anticoagulation.  I recommend holding anticoagulation therapy in the setting of acute hemorrhage.  We will need to weigh the risk/benefit of resuming anticoagulation therapy in the setting.  I do not recommend an IVC filter at present,  this may increase the risk of DVT.  We can consider placement of a retrievable IVC filter after further discussion with the patient and his family.  Venous thromboembolism Left lower extremity DVT following left knee surgery August 2016, despite 10 days of postoperative aspirin prophylaxis 3 months of Xarelto  anticoagulation Acute left lower extremity DVT (left posterior tibial, peroneal, popliteal, left femoral, and distal left common femoral veins) and bilateral pulmonary embolism April 2017 Xarelto  anticoagulation resumed, negative hypercoagulation evaluation Rectus sheath hematoma 12/25/2016 anticoagulation held Anticoagulation resumed with Xarelto  at a 10 mg dose 12/05/2024 lower extremity Dopplers: Negative on the right, chronic left femoral vein thrombosis Hypertension Left inguinal hernia-open surgical repair 01/22/2022 Admission 12/05/2024 with an intracerebral hemorrhage and left hemiplegia, Xarelto  discontinued     Recommendations: Hold anticoagulation therapy DVT prophylaxis with SCDs, Lovenox  if recommended by neurology and neurosurgery I will check on him periodically while on the rehabilitation unit and continue discussions regarding the indication to resume anticoagulation therapy.

## 2024-12-10 DIAGNOSIS — I69319 Unspecified symptoms and signs involving cognitive functions following cerebral infarction: Secondary | ICD-10-CM

## 2024-12-10 DIAGNOSIS — I6381 Other cerebral infarction due to occlusion or stenosis of small artery: Secondary | ICD-10-CM | POA: Diagnosis not present

## 2024-12-10 DIAGNOSIS — E876 Hypokalemia: Secondary | ICD-10-CM

## 2024-12-10 DIAGNOSIS — I69391 Dysphagia following cerebral infarction: Secondary | ICD-10-CM | POA: Diagnosis not present

## 2024-12-10 DIAGNOSIS — E870 Hyperosmolality and hypernatremia: Secondary | ICD-10-CM

## 2024-12-10 LAB — CBC WITH DIFFERENTIAL/PLATELET
Abs Immature Granulocytes: 0.05 K/uL (ref 0.00–0.07)
Basophils Absolute: 0 K/uL (ref 0.0–0.1)
Basophils Relative: 0 %
Eosinophils Absolute: 0.5 K/uL (ref 0.0–0.5)
Eosinophils Relative: 5 %
HCT: 35.9 % — ABNORMAL LOW (ref 39.0–52.0)
Hemoglobin: 11.9 g/dL — ABNORMAL LOW (ref 13.0–17.0)
Immature Granulocytes: 1 %
Lymphocytes Relative: 25 %
Lymphs Abs: 2.5 K/uL (ref 0.7–4.0)
MCH: 30 pg (ref 26.0–34.0)
MCHC: 33.1 g/dL (ref 30.0–36.0)
MCV: 90.4 fL (ref 80.0–100.0)
Monocytes Absolute: 0.6 K/uL (ref 0.1–1.0)
Monocytes Relative: 6 %
Neutro Abs: 6.3 K/uL (ref 1.7–7.7)
Neutrophils Relative %: 63 %
Platelets: 103 K/uL — ABNORMAL LOW (ref 150–400)
RBC: 3.97 MIL/uL — ABNORMAL LOW (ref 4.22–5.81)
RDW: 14.2 % (ref 11.5–15.5)
WBC: 9.9 K/uL (ref 4.0–10.5)
nRBC: 0 % (ref 0.0–0.2)

## 2024-12-10 LAB — COMPREHENSIVE METABOLIC PANEL WITH GFR
ALT: 35 U/L (ref 0–44)
AST: 41 U/L (ref 15–41)
Albumin: 3.1 g/dL — ABNORMAL LOW (ref 3.5–5.0)
Alkaline Phosphatase: 48 U/L (ref 38–126)
Anion gap: 9 (ref 5–15)
BUN: 37 mg/dL — ABNORMAL HIGH (ref 8–23)
CO2: 26 mmol/L (ref 22–32)
Calcium: 7.9 mg/dL — ABNORMAL LOW (ref 8.9–10.3)
Chloride: 117 mmol/L — ABNORMAL HIGH (ref 98–111)
Creatinine, Ser: 0.92 mg/dL (ref 0.61–1.24)
GFR, Estimated: >60 mL/min
Glucose, Bld: 138 mg/dL — ABNORMAL HIGH (ref 70–99)
Potassium: 3.1 mmol/L — ABNORMAL LOW (ref 3.5–5.1)
Sodium: 152 mmol/L — ABNORMAL HIGH (ref 135–145)
Total Bilirubin: 0.7 mg/dL (ref 0.0–1.2)
Total Protein: 5.4 g/dL — ABNORMAL LOW (ref 6.5–8.1)

## 2024-12-10 LAB — CULTURE, BLOOD (ROUTINE X 2)
Culture: NO GROWTH
Culture: NO GROWTH

## 2024-12-10 MED ORDER — OSMOLITE 1.5 CAL PO LIQD
1000.0000 mL | ORAL | Status: DC
  Administered 2024-12-10 – 2024-12-11 (×2): 1000 mL
  Filled 2024-12-10: qty 1000

## 2024-12-10 MED ORDER — FREE WATER
100.0000 mL | Freq: Four times a day (QID) | Status: DC
  Administered 2024-12-10 – 2024-12-12 (×9): 100 mL

## 2024-12-10 MED ORDER — POTASSIUM CHLORIDE 20 MEQ PO PACK
20.0000 meq | PACK | Freq: Two times a day (BID) | ORAL | Status: DC
  Administered 2024-12-10 – 2024-12-12 (×5): 20 meq via ORAL
  Filled 2024-12-10 (×6): qty 1

## 2024-12-10 NOTE — Progress Notes (Signed)
 " Inpatient Rehabilitation Care Coordinator Assessment and Plan Patient Details  Name: Dwayne [name] MRN: [id number] Date of Birth: 06/26/1946  Today's Date: 12/10/2024  Hospital Problems: Principal Problem:   Traumatic subdural hematoma [facility])  Past Medical History:  Past Medical History:  Diagnosis Date   Arrhythmia    Bilateral pulmonary embolism (HCC) 05/28/2016   03/03/16   DVT of lower limb, acute (HCC)    left popliteal tibial and peroneal vein   Hypertension    Retroperitoneal bleed 12/25/2016   Cough x 1 week on Xarelto  extensive abdominal wall hematoma tracking down to penis & scrotum   Past Surgical History:  Past Surgical History:  Procedure Laterality Date   INGUINAL HERNIA REPAIR Left 01/22/2022   Procedure: OPEN LEFT INGUINAL HERNIA REPAIR WITH MESH;  Surgeon: Vernetta Berg, MD;  Location: [facility];  Service: General;  Laterality: Left;   INGUINAL HERNIA REPAIR Right 01/20/2024   Procedure: open right HERNIA REPAIR INGUINAL ADULT;  Surgeon: Vernetta Berg, MD;  Location: [facility];  Service: General;  Laterality: Right;  LMA   KNEE SURGERY     Social History:  reports that he has never smoked. He has never used smokeless tobacco. He reports current alcohol use. He reports that he does not use drugs.  Family / Support Systems Marital Status: Married Patient Roles: Spouse, Parent, Other (Comment) (retiree) Spouse/Significant Other: Dwayne [name] [phone] Children: Dwayne [name] [phone] local Other Supports: Friends Anticipated Caregiver: Wife or going to rehab at [facility] Ability/Limitations of Caregiver: Wife can do min assist but may need to go to the rehab portion at [facility] Caregiver Availability: 24/7 Family Dynamics: Wife is still processing all that has happened she reports everyone thought he would be the last person this would happen too. Seeing how active and in shape he is. 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 issues Guardian/Conservator: None-according to MD pt is not fully capable of making his own decisions a this time will look toward his wife since no formal POA/HCPOA in place   Abuse/Neglect Abuse/Neglect Assessment Can Be Completed: Yes Physical Abuse: Denies Verbal Abuse: Denies Sexual Abuse: Denies Exploitation of patient/patient's resources: Denies Self-Neglect: Denies  Patient response to: Social Isolation - How often do you feel lonely or isolated from those around you?: Never  Emotional Status Pt's affect, behavior and adjustment status: Pt answers some questions but not sure he is accurate in the answer. Wife provided the information for this assessment. Pt was very active went to the gym daily, lifted weights and took care of all needs in the home-ie bills, financials, etc Recent Psychosocial Issues: other health issues Psychiatric History: No issues-history once able to particpate more would benefit from seeing neuro-psych due to how active he was and for adjustment/coping Substance Abuse History: NA  Patient / Family Perceptions, Expectations & Goals Pt/Family understanding of illness & functional limitations: Pt is aware he is in the hospital but not sure he is aware of his stroke. Wife is here daily and talks with the MD's involved with husband's care. She is  still trying to process all that has happened with this CVA and deficits. She is overwelmed  with all that is going on Premorbid pt/family roles/activities:  husband, father, retiree, friend, etc Anticipated changes in roles/activities/participation: resume Pt/family expectations/goals: Wife states:  I hope he does well and know he may need to go to the rehab of [facility] but not sure they take medicare.  Community Resources Dwayne [name]: Other (Comment) ([facility] IL[facility]) Premorbid Home Care/DME Agencies: None Transportation available at discharge: wife and pt Is the patient able to respond to transportation needs?: Yes In the past 12 months, has lack of transportation kept you from medical appointments or from getting medications?: No In the past 12 months, has lack of transportation kept you from meetings, work, or from getting things needed for daily living?: No Resource referrals recommended: Neuropsychology  Discharge Planning Living Arrangements: Spouse/significant other Support Systems: Spouse/significant other, Children, Friends/neighbors Type of Residence: Private residence Insurance Resources: Electrical Engineer Resources: Tree Surgeon, Family Support Financial Screen Referred: No Living Expenses: Rent Money Management: Patient Does the patient have any problems obtaining your medications?: No Home Management: wife Patient/Family Preliminary Plans: Plans depends upon pt's level of care at discharge, lives in ILF at [facility] and may have the option to go to the rehab part. Will reach out to the SW at [facility]. Wife is aware of being evaluated and goals being set for stay here. Care Coordinator Anticipated Follow Up Needs: HH/OP, SNF  Clinical Impression Unfortunate pt who was very active prior to admission and now has had a CVA. He and wife live in ILF at [facility] and will return there probably in the rehab part due to care needs at discharge. Wife is supportive and can provide some assist but does have health issues of her own. She plans to go to the back to try to figure out what bills are being paid and which  ones are automatic. She has much to do to figure out the finances and hopeful pt makes good progress here.  Dwayne [name] 12/10/2024, 1:06 PM    "

## 2024-12-10 NOTE — Plan of Care (Signed)
 " Problem: RH Balance Goal: LTG: Patient will maintain dynamic sitting balance (OT) Description: LTG:  Patient will maintain dynamic sitting balance with assistance during activities of daily living (OT) Flowsheets (Taken 12/10/2024 1311) LTG: Pt will maintain dynamic sitting balance during ADLs with: Minimal Assistance - Patient > 75% Goal: LTG Patient will maintain dynamic standing with ADLs (OT) Description: LTG:  Patient will maintain dynamic standing balance with assist during activities of daily living (OT)  Flowsheets (Taken 12/10/2024 1311) LTG: Pt will maintain dynamic standing balance during ADLs with: Moderate Assistance - Patient 50 - 74%   Problem: Sit to Stand Goal: LTG:  Patient will perform sit to stand in prep for activites of daily living with assistance level (OT) Description: LTG:  Patient will perform sit to stand in prep for activites of daily living with assistance level (OT) Flowsheets (Taken 12/10/2024 1311) LTG: PT will perform sit to stand in prep for activites of daily living with assistance level: Minimal Assistance - Patient > 75%   Problem: RH Eating Goal: LTG Patient will perform eating w/assist, cues/equip (OT) Description: LTG: Patient will perform eating with assist, with/without cues using equipment (OT) Flowsheets (Taken 12/10/2024 1311) LTG: Pt will perform eating with assistance level of: Supervision/Verbal cueing   Problem: RH Grooming Goal: LTG Patient will perform grooming w/assist,cues/equip (OT) Description: LTG: Patient will perform grooming with assist, with/without cues using equipment (OT) Flowsheets (Taken 12/10/2024 1311) LTG: Pt will perform grooming with assistance level of: Minimal Assistance - Patient > 75%   Problem: RH Bathing Goal: LTG Patient will bathe all body parts with assist levels (OT) Description: LTG: Patient will bathe all body parts with assist levels (OT) Flowsheets (Taken 12/10/2024 1311) LTG: Pt will perform bathing with  assistance level/cueing: Moderate Assistance - Patient 50 - 74%   Problem: RH Dressing Goal: LTG Patient will perform upper body dressing (OT) Description: LTG Patient will perform upper body dressing with assist, with/without cues (OT). Flowsheets (Taken 12/10/2024 1311) LTG: Pt will perform upper body dressing with assistance level of: Minimal Assistance - Patient > 75% Goal: LTG Patient will perform lower body dressing w/assist (OT) Description: LTG: Patient will perform lower body dressing with assist, with/without cues in positioning using equipment (OT) Flowsheets (Taken 12/10/2024 1311) LTG: Pt will perform lower body dressing with assistance level of: Moderate Assistance - Patient 50 - 74%   Problem: RH Toileting Goal: LTG Patient will perform toileting task (3/3 steps) with assistance level (OT) Description: LTG: Patient will perform toileting task (3/3 steps) with assistance level (OT)  Flowsheets (Taken 12/10/2024 1311) LTG: Pt will perform toileting task (3/3 steps) with assistance level: Moderate Assistance - Patient 50 - 74%   Problem: RH Vision Goal: RH LTG Vision Consulting Civil Engineer) Flowsheets (Taken 12/10/2024 1311) LTG: Vision Goals: Patient will locate needed BADL items left of midline with only min cueing   Problem: RH Functional Use of Upper Extremity Goal: LTG Patient will use RT/LT upper extremity as a (OT) Description: LTG: Patient will use right/left upper extremity as a stabilizer/gross assist/diminished/nondominant/dominant level with assist, with/without cues during functional activity (OT) Flowsheets (Taken 12/10/2024 1311) LTG: Use of upper extremity in functional activities: LUE as a stabilizer LTG: Pt will use upper extremity in functional activity with assistance level of: Minimal Assistance - Patient > 75%   Problem: RH Toilet Transfers Goal: LTG Patient will perform toilet transfers w/assist (OT) Description: LTG: Patient will perform toilet transfers with assist,  with/without cues using equipment (OT) Flowsheets (Taken 12/10/2024 1311) LTG:  Pt will perform toilet transfers with assistance level of: Moderate Assistance - Patient 50 - 74%   Problem: RH Tub/Shower Transfers Goal: LTG Patient will perform tub/shower transfers w/assist (OT) Description: LTG: Patient will perform tub/shower transfers with assist, with/without cues using equipment (OT) Flowsheets (Taken 12/10/2024 1311) LTG: Pt will perform tub/shower stall transfers with assistance level of: Moderate Assistance - Patient 50 - 74% LTG: Pt will perform tub/shower transfers from: Walk in shower   Problem: RH Attention Goal: LTG Patient will demonstrate this level of attention during functional activites (OT) Description: LTG:  Patient will demonstrate this level of attention during functional activites  (OT) Flowsheets (Taken 12/10/2024 1311) Patient will demonstrate this level of attention during functional activites: Selective LTG: Patient will demonstrate this level of attention during functional activites (OT): Minimal Assistance - Patient > 75%   Problem: RH Awareness Goal: LTG: Patient will demonstrate awareness during functional activites type of (OT) Description: LTG: Patient will demonstrate awareness during functional activites type of (OT) Flowsheets (Taken 12/10/2024 1311) Patient will demonstrate awareness during functional activites type of: Emergent LTG: Patient will demonstrate awareness during functional activites type of (OT): Minimal Assistance - Patient > 75%   "

## 2024-12-10 NOTE — Progress Notes (Signed)
 Inpatient Rehabilitation Center Individual Statement of Services  Patient Name:  LEBRON NAUERT  Date:  12/10/2024  Welcome to the Inpatient Rehabilitation Center.  Our goal is to provide you with an individualized program based on your diagnosis and situation, designed to meet your specific needs.  With this comprehensive rehabilitation program, you will be expected to participate in at least 3 hours of rehabilitation therapies Monday-Friday, with modified therapy programming on the weekends.  Your rehabilitation program will include the following services:  Physical Therapy (PT), Occupational Therapy (OT), Speech Therapy (ST), 24 hour per day rehabilitation nursing, Therapeutic Recreaction (TR), Neuropsychology, Care Coordinator, Rehabilitation Medicine, Nutrition Services, and Pharmacy Services  Weekly team conferences will be held on Wednesday to discuss your progress.  Your Inpatient Rehabilitation Care Coordinator will talk with you frequently to get your input and to update you on team discussions.  Team conferences with you and your family in attendance may also be held.  Expected length of stay: 4 weeks  Overall anticipated outcome: mod assist level  Depending on your progress and recovery, your program may change. Your Inpatient Rehabilitation Care Coordinator will coordinate services and will keep you informed of any changes. Your Inpatient Rehabilitation Care Coordinator's name and contact numbers are listed  below.  The following services may also be recommended but are not provided by the Inpatient Rehabilitation Center:  Driving Evaluations Home Health Rehabiltiation Services Outpatient Rehabilitation Services    Arrangements will be made to provide these services after discharge if needed.  Arrangements include referral to agencies that provide these services.  Your insurance has been verified to be:  medicare & AARP Your primary doctor is:  Dorn Sauers  Pertinent  information will be shared with your doctor and your insurance company.  Inpatient Rehabilitation Care Coordinator:  Rhoda Clement, KEN [phone] or [phone]  Information discussed with and copy given to patient by: Clement Asberry MATSU, 12/10/2024, 1:15 PM

## 2024-12-10 NOTE — Evaluation (Signed)
 Physical Therapy Assessment and Plan  Patient Details  Name: Dwayne [name] MRN: [id number] Date of Birth: [birth year]  PT Diagnosis: Abnormal posture, Coordination disorder, Hemiplegia non-dominant, Impaired cognition, Impaired sensation, and Muscle weakness Rehab Potential: Fair ELOS: 4 weeks   Today's Date: 12/10/2024 PT Individual Time: 1300-1415 PT Individual Time Calculation (min): 75 min    Hospital Problem: Principal Problem:   Traumatic subdural hematoma (HCC)   Past Medical History:  Past Medical History:  Diagnosis Date   Arrhythmia    Bilateral pulmonary embolism (HCC) 05/28/2016   03/03/16   DVT of lower limb, acute (HCC)    left popliteal tibial and peroneal vein   Hypertension    Retroperitoneal bleed 12/25/2016   Cough x 1 week on Xarelto  extensive abdominal wall hematoma tracking down to penis & scrotum   Past Surgical History:  Past Surgical History:  Procedure Laterality Date   INGUINAL HERNIA REPAIR Left 01/22/2022   Procedure: OPEN LEFT INGUINAL HERNIA REPAIR WITH MESH;  Surgeon: Vernetta Berg, MD;  Location: [facility];  Service: General;  Laterality: Left;   INGUINAL HERNIA REPAIR Right 01/20/2024   Procedure: open right HERNIA REPAIR INGUINAL ADULT;  Surgeon: Vernetta Berg, MD;  Location:  SURGERY CENTER;  Service: General;  Laterality: Right;  LMA   KNEE SURGERY      Assessment & Plan Clinical Impression: Dwayne [name] is a 79 year old right-handed male with history of hypertension, arrhythmia, 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 Per chart review patient lives with spouse independent living facility/[facility]. 1 level home with level entry. Presented 12/05/2024 with headache and poor appetite as well as a fall while going to the bathroom x 2. Denied loss of consciousness.. Cranial CT scan showed large acute right temporal parenchymal  hemorrhage with moderate edema and a 6 mm leftward midline shift. Left frontal scalp soft tissue swelling. CT cervical spine negative. CT maxillofacial showed acute at most minimally displaced bilateral nasal bone fractures. Nasal and left-sided facial soft tissue swelling. CTA head and neck showed no underlying aneurysm or vascular malformation identified. Venous Doppler studies 12/05/2024 showed chronic DVT left femoral vein. Patient's Xarelto  was reversed with Kcentra . Neurosurgery consulted/Dr. Louis advised conservative care. Hospital course 12/06/2024 patient with some altered mental status with follow-up CT/MRI showed large right hemispheric hemorrhage with surrounding confluent edema. Small right subdural hematoma and trace intraventricular hemorrhage. Intracranial mass effect with leftward midline shift 9 mm. 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 Due to worsening ICH patient placed on 3% saline until 12/06/2024 and stopped. Patient was cleared to begin Lovenox  for DVT prophylaxis 12/07/2024. Echocardiogram with ejection fraction of 60 to 65%. In regards to resuming patient's chronic Xarelto  discussed with hematology services Dr. Deanne who felt patient did not have any immediate needs to resume anticoagulation or place an IVC filter. Patient with fevers, suspected to be central fever. UA and chest x-ray have been negative. Followed by speech therapy for dysphagia initially n.p.o. MBS 12/09/2024 started on a full liquid diet still receiving supplemental nutrition through nasogastric tube. Therapy evaluations completed due to patient's decreased functional mobility was admitted for a comprehensive rehab program.   Patient currently requires total with mobility secondary to muscle weakness, decreased coordination and decreased motor planning, decreased midline orientation, left side neglect, and decreased motor planning, and decreased awareness, decreased safety awareness, and delayed  processing.  Prior to hospitalization, patient was independent  with mobility and lived with  Spouse in a Independent living facility home.  Home access is  Level entry.  Patient will benefit from skilled PT intervention to maximize safe functional mobility, minimize fall risk, and decrease caregiver burden for planned discharge home with 24 hour assist.  Anticipate patient will benefit from follow up [facility] at discharge.  PT - End of Session Activity Tolerance: Tolerates 10 - 20 min activity with multiple rests Endurance Deficit: Yes Endurance Deficit Description: Varying pulse - ranges from 33-56 during eval PT Assessment Rehab Potential (ACUTE/IP ONLY): Fair PT Barriers to Discharge: Decreased caregiver support PT Barriers to Discharge Comments: pt may return to facility at different LOC. PT Patient demonstrates impairments in the following area(s): Balance;Perception;Safety;Endurance;Motor;Sensory PT Transfers Functional Problem(s): Bed Mobility;Bed to Chair;Car;Furniture PT Locomotion Functional Problem(s): Ambulation;Wheelchair Mobility PT Plan PT Intensity: Minimum of 1-2 x/day ,45 to 90 minutes PT Frequency: 5 out of 7 days PT Duration Estimated Length of Stay: 4 weeks PT Treatment/Interventions: Ambulation/gait training;Community reintegration;Neuromuscular re-education;UE/LE Strength taining/ROM;Wheelchair propulsion/positioning;UE/LE Coordination activities;Therapeutic Activities;Discharge planning;Balance/vestibular training;Functional mobility training;Patient/family education;Therapeutic Exercise PT Transfers Anticipated Outcome(s): mod A PT Locomotion Anticipated Outcome(s): Mod A PT Recommendation Recommendations for Other Services: Neuropsych consult Follow Up Recommendations: Home health PT Patient destination: Home Equipment Recommended: To be determined Equipment Details: pt has no DME PTA   PT Evaluation Precautions/Restrictions Precautions Precautions:  Fall Precaution/Restrictions Comments: Left neglect, does not cross midline to left, Pt seems to be opening eyes more. Restrictions Weight Bearing Restrictions Per Provider Order: No General   Vital SignsTherapy Vitals Temp: 98.6 F (37 C) Temp Source: Oral Pulse Rate: 90 Resp: (!) 30 BP: (!) 151/72 Patient Position (if appropriate): Lying Oxygen Therapy SpO2: 97 % O2 Device: Room Air Patient Activity (if Appropriate): In bed Pulse Oximetry Type: Intermittent Pain Pain Assessment Pain Scale: 0-10 Pain Score: 0-No pain Faces Pain Scale: No hurt Pain Interference Pain Interference Pain Effect on Sleep: 1. Rarely or not at all Pain Interference with Therapy Activities: 1. Rarely or not at all Pain Interference with Day-to-Day Activities: 1. Rarely or not at all Home Living/Prior Functioning Home Living Living Arrangements: Spouse/significant other Available Help at Discharge: Family (D/C to same facility w/ increased care.) Type of Home: Independent living facility Home Access: Level entry Home Layout:  (elevators to 3rd floor apt.) Bathroom Shower/Tub: Walk-in shower Additional Comments: lives with wife in indep apartment at [employer] With: Spouse Prior Function Level of Independence: Independent with transfers;Independent with gait  Able to Take Stairs?: Yes Driving: Yes Vocation: Retired Marine Scientist Requirements: ptexercises daily. Vision/Perception  Perception Preception Impairment Details: Inattention/Neglect;Spatial orientation Praxis Praxis Impairment Details: Perseveration;Motor planning;Initiation  Cognition Overall Cognitive Status: Impaired/Different from baseline Arousal/Alertness: Lethargic Orientation Level: Oriented to person;Oriented to place Year: 2026 Month: January Day of Week: Correct Attention: Focused;Sustained Focused Attention: Appears intact Sustained Attention: Impaired Sustained Attention Impairment: Verbal basic;Functional  basic Memory: Impaired Memory Impairment: Retrieval deficit Awareness: Impaired Awareness Impairment: Intellectual impairment;Emergent impairment;Anticipatory impairment Problem Solving: Impaired Problem Solving Impairment: Verbal basic;Functional basic Executive Function: Organizing;Initiating;Decision Making Organizing: Impaired Organizing Impairment: Verbal basic;Functional basic Decision Making: Impaired Decision Making Impairment: Verbal basic;Functional basic Initiating: Impaired Initiating Impairment: Verbal basic;Functional basic Behaviors: Perseveration;Impulsive Safety/Judgment: Impaired Sensation Sensation Light Touch: Impaired by gross assessment Additional Comments: LLE, but cognitive deficits inhibit Coordination Gross Motor Movements are Fluid and Coordinated: No Fine Motor Movements are Fluid and Coordinated: No Heel Shin Test: unable 2/2 cognition. Motor  Motor Motor: Hemiplegia;Abnormal postural alignment and control;Abnormal tone Motor - Skilled Clinical Observations: Dense hemiplegia, pushes toward weaker  left side   Trunk/Postural Assessment  Cervical Assessment Cervical Assessment: Exceptions to [facility] (turned to right eyes do not shift past midline.) Thoracic Assessment Thoracic Assessment: Exceptions to [facility] (flexion and lean/push to left, spouse states forward head/kyphosis PTA.) Lumbar Assessment Lumbar Assessment: Exceptions to [facility] (post pelvic tilt.) Postural Control Postural Control: Deficits on evaluation Head Control: Forward head rotated right Trunk Control: FLexed truk, left lateral flexion in sitting Postural Limitations: Pushes strongly to left in sitting - will shift Right w/ cueing but reverts.  Balance Balance Balance Assessed: Yes Static Sitting Balance Static Sitting - Balance Support: Right upper extremity supported;Feet supported Static Sitting - Level of Assistance: 3: Mod assist Static Sitting - Comment/# of Minutes: approx. 1  min. Dynamic Sitting Balance Dynamic Sitting - Balance Support: Right upper extremity supported;Feet supported Dynamic Sitting - Level of Assistance: 1: +1 Total assist;2: Max assist Static Standing Balance Static Standing - Balance Support: Right upper extremity supported Static Standing - Level of Assistance: 1: +1 Total assist Static Standing - Comment/# of Minutes: Only able to achieve partial stand Extremity Assessment  RUE Assessment RUE Assessment: Within Functional Limits LUE Assessment LUE Assessment: Exceptions to [facility] Active Range of Motion (AROM) Comments: Dense left hemiplegia, has movement - significantly delayed and absent sensation distal LUE LUE Body System: Neuro Brunstrum levels for arm and hand: Arm;Hand Brunstrum level for arm: Stage I Presynergy Brunstrum level for hand: Stage I Flaccidity RLE Assessment RLE Assessment: Not tested General Strength Comments: appears at least grossly 4/5, but pushes to left. LLE Assessment LLE Assessment: Not tested General Strength Comments: minimal movement LLE.  Care Tool Care Tool Bed Mobility Roll left and right activity   Roll left and right assist level: Total Assistance - Patient < 25%    Sit to lying activity   Sit to lying assist level: Dependent - Patient 0%    Lying to sitting on side of bed activity   Lying to sitting on side of bed assist level: the ability to move from lying on the back to sitting on the side of the bed with no back support.: Maximal Assistance - Patient 25 - 49%     Care Tool Transfers Sit to stand transfer   Sit to stand assist level: 2 Helpers    Chair/bed transfer Chair/bed transfer activity did not occur: Safety/medical concerns Chair/bed transfer assist level: Dependent - Armed Forces Operational Officer transfer activity did not occur: Safety/medical concerns        Care Tool Locomotion Ambulation Ambulation activity did not occur: Safety/medical concerns        Walk 10  feet activity Walk 10 feet activity did not occur: Safety/medical concerns       Walk 50 feet with 2 turns activity Walk 50 feet with 2 turns activity did not occur: Safety/medical concerns      Walk 150 feet activity Walk 150 feet activity did not occur: Safety/medical concerns      Walk 10 feet on uneven surfaces activity Walk 10 feet on uneven surfaces activity did not occur: Safety/medical concerns      Stairs Stair activity did not occur: Safety/medical concerns        Walk up/down 1 step activity Walk up/down 1 step or curb (drop down) activity did not occur: Safety/medical concerns      Walk up/down 4 steps activity Walk up/down 4 steps activity did not occur: Safety/medical concerns      Walk up/down 12 steps activity Walk  up/down 12 steps activity did not occur: Safety/medical concerns      Pick up small objects from floor Pick up small object from the floor (from standing position) activity did not occur: Safety/medical concerns      Wheelchair Is the patient using a wheelchair?: Yes Type of Wheelchair: Manual   Wheelchair assist level: Dependent - Patient 0%    Wheel 50 feet with 2 turns activity   Assist Level: Dependent - Patient 0%  Wheel 150 feet activity   Assist Level: Dependent - Patient 0%    Refer to Care Plan for Long Term Goals  SHORT TERM GOAL WEEK 1 PT Short Term Goal 1 (Week 1): Pt will roll side to side w/ max A PT Short Term Goal 2 (Week 1): Pt will transfer sup to sit w/ mod A consistently w/ bed features. PT Short Term Goal 3 (Week 1): Pt will transfer sit to stand to RW w/ max A  Recommendations for other services: Neuropsych  Skilled Therapeutic Intervention Evaluation completed (see details above and below) with education on PT POC and goals and individual treatment initiated with focus on  strengthening, seated balance, midline orientation, endurance, progress to gait as appropriate.  Pt presents semi-reclined in bed and agreeable to  therapy.  Pt w/ inability to turn past midline to left.  Pt required max/total A for sup to sit but does initiate w/ cueing.  Pt initially sat EOB w/ min A but commences strong push to Left.  Pt will correct w/ verbal cues but reverts to pushing.  Pt flexed forward and minimal correction.  Spouse says this is pre-morbid.  P transfers sit to stand w/ A + 2 to [place].  Pt still pushes to L.  Pt able to perform sit to stand fromperch w/ mod A x 2.  Pt transferred to TIS and positioned w/ pillow to Left for LUE support/positioning.  Pt has pureed meal in front but does not initiate eating other than holding spoon and tapping it on plate.  Pt fed by PT and then spouse.  NT aware and will monitor.  Seat belt around hips.  Pt to be returned to bed w/ Maximove.     Mobility Bed Mobility Bed Mobility: Rolling Left;Left Sidelying to Sit Rolling Left: Dependent - Patient equal 0% Left Sidelying to Sit: Maximal Assistance - Patient 25-49% Transfers Transfers: Sit to Stand;Stand to Sit Sit to Stand: 2 Helpers Stand to Sit: 2 Advertising Account Planner (Assistive device): None Transfer via Lift Equipment: Engineering Geologist: No Gait Gait: No Stairs / Additional Locomotion Stairs: No Naval Architect Mobility: No   Discharge Criteria: Patient will be discharged from PT if patient refuses treatment 3 consecutive times without medical reason, if treatment goals not met, if there is a change in medical status, if patient makes no progress towards goals or if patient is discharged from hospital.  The above assessment, treatment plan, treatment alternatives and goals were discussed and mutually agreed upon: by patient and by family  Dwayne [name] 12/10/2024, 3:48 PM

## 2024-12-10 NOTE — Evaluation (Signed)
 Speech Language Pathology Assessment and Plan  Patient Details  Name: D[facility]ne [name] MRN: [id number] Date of Birth: 1946/02/03  SLP Diagnosis: Cognitive Impairments;Dysphagia  Rehab Potential: Fair ELOS: 4 weeks    Today's Date: 12/10/2024 SLP Individual Time: 8899-8841 SLP Individual Time Calculation (min): 58 min   Hospital Problem: Principal Problem:   Traumatic subdural hematoma (HCC)  Past Medical History:  Past Medical History:  Diagnosis Date   Arrhythmia    Bilateral pulmonary embolism (HCC) 05/28/2016   03/03/16   DVT of lower limb, acute (HCC)    left popliteal tibial and peroneal vein   Hypertension    Retroperitoneal bleed 12/25/2016   Cough x 1 week on Xarelto  extensive abdominal wall hematoma tracking down to penis & scrotum   Past Surgical History:  Past Surgical History:  Procedure Laterality Date   INGUINAL HERNIA REPAIR Left 01/22/2022   Procedure: OPEN LEFT INGUINAL HERNIA REPAIR WITH MESH;  Surgeon: Vernetta Berg, MD;  Location: [facility];  Service: General;  Laterality: Left;   INGUINAL HERNIA REPAIR Right 01/20/2024   Procedure: open right HERNIA REPAIR INGUINAL ADULT;  Surgeon: Vernetta Berg, MD;  Location: [facility];  Service: General;  Laterality: Right;  LMA   KNEE SURGERY      Assessment / Plan / Recommendation Clinical Impression  Pt is a 79 y/o male with 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  In ED NIHSS 8, WBC 12.5, INR 1.5. Imaging revealed mixed density lobar hemorrhage in his right posterior temporal lobe. Serial CTs showed increased ICH with increased MLS but stable clinical picture. 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 Therapy evaluations completed and pt was recommended for CIR.   Cognitive-Linguistic  Evaluation: Patient presents with profound cognitive deficits in the areas of sustained attention, L neglect, problem solving, awareness, and mild memory deficits with relatively intact orientation skills. Motor speech and receptive/expressive language appear WFL. Patient's severity of impairments prevent implementation of formal evaluation measures, thus cognition was assessed via informal functional environmental tasks. During bedside swallow evaluation, patient was dependent to attend to midline to locate objects. He was also dependent for sustained attention, often tangential and difficult to redirect. Patient demonstrated unsafe and impulsive behaviors re: food and drink, attempting to drink while laying down and prior to full setup of items despite cuing. Patient recalled location, time, and purpose for hospital admission independently. Patient would benefit from ongoing SLP services targeting all aforementioned cognitive deficits.   Bedside Swallow Evaluation: Patient presents with primary cognitive dysphagia, though mild L oral weakness contributes to impaired mastication and L anterior spillage intermittently. Patient required total assist to sustain attention to task during oral care. SLP presented patient with regular solids, pudding, and thin liquids with straw. Patient demonstrated impulsivity with thin liquids, requiring safety cues to maintain upright position during swallowing tasks. Additionally, patient was dependent for attending to midline to locate food items on tray. With regular solids, patient demonstrated impaired mastication, reduced awareness of bolus resulting in oral holding, and required liquid wash to clear. With pudding, patient demonstrated reduced bolus awareness and intermittent L anterior loss, however overall this item appeared to clear oral cavity much more quickly and safely when given cues and feeding assist. Recommend Dys1/thin liquid diet with FULL supervision and FULL  assist for safety. Administer medications crushed in  puree. Patient would benefit from ongoing SLP services to target lingering swallowing deficits.   Patient was left in lowered bed with call bell in reach and bed alarm set. SLP will continue to target goals per plan of care.     Skilled Therapeutic Interventions          SLP conducted skilled evaluation session to assess cognitive-linguistic and swallowing function. Utilized functional environmental cognitive tasks, bedside swallow evaluation, and patient and/or family interview. Full results above.   SLP Assessment  Patient will need skilled Speech Lanaguage Pathology Services during CIR admission    Recommendations  SLP Diet Recommendations: Dysphagia 1 (Puree);Thin Liquid Administration via: Cup;Straw Medication Administration: Crushed with puree Supervision: Full supervision/cueing for compensatory strategies;Staff to assist with self feeding Compensations: Slow rate;Small sips/bites Postural Changes and/or Swallow Maneuvers: Seated upright 90 degrees Oral Care Recommendations: Oral care QID Recommendations for Other Services: Neuropsych consult Patient destination: Home Follow up Recommendations: 24 hour supervision/assistance;Home Health SLP Equipment Recommended: None recommended by SLP    SLP Frequency 3 to 5 out of 7 days   SLP Duration  SLP Intensity  SLP Treatment/Interventions 4 weeks  Minumum of 1-2 x/day, 30 to 90 minutes  Cognitive remediation/compensation;Dysphagia/aspiration precaution training;Internal/external aids;Cueing hierarchy;Environmental controls;Therapeutic Activities;Functional tasks;Patient/family education    Pain Pain Assessment Pain Scale: Faces Pain Score: 0-No pain Faces Pain Scale: No hurt  Prior Functioning Cognitive/Linguistic Baseline: Within functional limits Type of Home: Independent living facility  Lives With: Spouse Available Help at Discharge: Family Vocation: Retired  SLP  Evaluation Cognition Overall Cognitive Status: Impaired/Different from baseline Arousal/Alertness: Lethargic Orientation Level: Oriented to person;Oriented to place;Oriented to situation;Oriented to time Year: 2026 Month: January Day of Week: Correct Attention: Focused;Sustained Focused Attention: Appears intact Sustained Attention: Impaired Sustained Attention Impairment: Verbal basic;Functional basic Memory: Impaired Memory Impairment: Retrieval deficit Awareness: Impaired Awareness Impairment: Intellectual impairment;Emergent impairment;Anticipatory impairment Problem Solving: Impaired Problem Solving Impairment: Verbal basic;Functional basic Executive Function: Organizing;Initiating;Decision Making Organizing: Impaired Organizing Impairment: Verbal basic;Functional basic Decision Making: Impaired Decision Making Impairment: Verbal basic;Functional basic Initiating: Impaired Initiating Impairment: Verbal basic;Functional basic Behaviors: Perseveration;Impulsive Safety/Judgment: Impaired  Comprehension Auditory Comprehension Overall Auditory Comprehension: Appears within functional limits for tasks assessed Expression Expression Primary Mode of Expression: Verbal Verbal Expression Overall Verbal Expression: Appears within functional limits for tasks assessed Written Expression Dominant Hand: Right Oral Motor Oral Motor/Sensory Function Overall Oral Motor/Sensory Function: Mild impairment Facial ROM: Reduced left;Suspected CN VII (facial) dysfunction Facial Symmetry: Abnormal symmetry left;Suspected CN VII (facial) dysfunction Facial Strength: Reduced left;Suspected CN VII (facial) dysfunction Facial Sensation: Within Functional Limits Lingual ROM: Reduced right;Reduced left Lingual Symmetry: Within Functional Limits Lingual Strength: Within Functional Limits Lingual Sensation: Within Functional Limits Velum: Within Functional Limits Mandible: Within Functional  Limits Motor Speech Overall Motor Speech: Appears within functional limits for tasks assessed  Care Tool Care Tool Cognition Ability to hear (with hearing aid or hearing appliances if normally used Ability to hear (with hearing aid or hearing appliances if normally used): 0. Adequate - no difficulty in normal conservation, social interaction, listening to TV   Expression of Ideas and Wants Expression of Ideas and Wants: 4. Without difficulty (complex and basic) - expresses complex messages without difficulty and with speech that is clear and easy to understand   Understanding Verbal and Non-Verbal Content Understanding Verbal and Non-Verbal Content: 3. Usually understands - understands most conversations, but misses some part/intent of message. Requires cues at times to understand  Memory/Recall Ability Memory/Recall Ability : Current season;That he or she is  in a hospital/hospital unit      Bedside Swallowing Assessment General Date of Onset: 12/06/24 Previous Swallow Assessment: MBS 1/14 Diet Prior to this Study: Full liquid diet;Thin liquids (Level 0) Temperature Spikes Noted: No Respiratory Status: Room air History of Recent Intubation: No Behavior/Cognition: Cooperative;Impulsive;Lethargic/Drowsy;Requires cueing;Distractible Oral Cavity - Dentition: Adequate natural dentition Self-Feeding Abilities: Total assist Patient Positioning: Upright in bed Baseline Vocal Quality: Normal Volitional Cough: Weak Volitional Swallow: Able to elicit  Oral Care Assessment Oral Assessment  (WDL): Within Defined Limits Lips: Symmetrical Teeth: Missing (Comment) Tongue: Pink;Moist Mucous Membrane(s): Pink;Moist Saliva: Moist, saliva free flowing Level of Consciousness: Responds to Voice Is patient on any of following O2 devices?: None of the above Nutritional status: On tube feedings Oral Assessment Risk : High Risk Ice Chips Ice chips: Not tested Thin Liquid Thin Liquid: Within  functional limits Presentation: Self Fed;Straw Nectar Thick Nectar Thick Liquid: Not tested Honey Thick Honey Thick Liquid: Not tested Puree Puree: Impaired Presentation: Spoon Oral Phase Impairments: Reduced labial seal Oral Phase Functional Implications: Left anterior spillage Solid Solid: Impaired Presentation: Self Fed Oral Phase Impairments: Reduced lingual movement/coordination;Poor awareness of bolus;Impaired mastication Oral Phase Functional Implications: Prolonged oral transit;Oral holding;Impaired mastication BSE Assessment Risk for Aspiration Impact on safety and function: Mild aspiration risk Other Related Risk Factors: Cognitive impairment;Lethargy;Deconditioning  Short Term Goals: Week 1: SLP Short Term Goal 1 (Week 1): Patient will attend to information presented at midline during functional tasks in 2/5 opportunities with max assist. SLP Short Term Goal 2 (Week 1): Patient will tolerate trials of Dys2+ solids with max cues for awareness and swallow safety. SLP Short Term Goal 3 (Week 1): Patient will sustain attention for 1-3 minutes during functional tasks with total assist. SLP Short Term Goal 4 (Week 1): Patient will follow 1 step directions in 80% of opportunities with max assist. SLP Short Term Goal 5 (Week 1): Patient will state 2 cognitive deficits given max assist.  Refer to Care Plan for Long Term Goals  Recommendations for other services: Neuropsych  Discharge Criteria: Patient will be discharged from SLP if patient refuses treatment 3 consecutive times without medical reason, if treatment goals not met, if there is a change in medical status, if patient makes no progress towards goals or if patient is discharged from hospital.  The above assessment, treatment plan, treatment alternatives and goals were discussed and mutually agreed upon: by patient and by family  Rosina Downy, M.A., CCC-SLP  Kyston Gonce A Suriya Kovarik 12/10/2024, 12:54 PM

## 2024-12-10 NOTE — Progress Notes (Signed)
 Initial Nutrition Assessment  DOCUMENTATION CODES:   Not applicable  INTERVENTION:  Continue tube feeding via Cortrak: transition to nocturnal feeds to assess daytime PO intake and ability to discontinue Cortrak Osmolite 1.5 at 60 ml/h x 12 h/day (1800-0600) (720 ml per day) Prosource TF20 60 ml BID Provides 1240 kcal (meets 68% calorie needs), 85 gm protein (meets 94% protein needs), 548 ml free water  daily FWF 100 ml q6h (total free water  948 ml daily) May need to be increased to 200 ml q6h if hypernatremia does not improve  Monitor daytime PO intake to assess if tube feeds can be discontinued Add oral supplements if pt agreeable and tube feeds stopped Continue full feeding assistance to optimize oral intake  NUTRITION DIAGNOSIS:   Swallowing difficulty related to dysphagia as evidenced by other (comment), per patient/family report (diagnosis).  GOAL:   Patient will meet greater than or equal to 90% of their needs  MONITOR:   PO intake, Diet advancement, TF tolerance  REASON FOR ASSESSMENT:   Consult Enteral/tube feeding initiation and management  ASSESSMENT:   Pt with hx of HTN, DVT, and PE. Recent admission 1/10-1/14 with poor PO intake and multiple falls, diagnosed ICH. 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 Recent MBS advanced diet to FLD. Admitted to CIR for comprehensive rehab.  1/14 MBS advanced to FLD 1/15 SLP assessment advanced to DYS 1 thins w/ full feeding assistance  Spoke with SLP who endorses that pt did well with DYS 1 diet today and believes he will eat well as long as he has feeding assistance. Discussed transitioning to nocturnal feeds and monitoring intake over next day to see if Cortrak can be removed. Adjusted order and will add oral supplements if PO intake remains consistent.   Pt participating in rehab each time RD attempted assessment, obtained all information from chart. Previous RD assessment noted  that pt was eating well at [facility] up until a week PTA. Pt was diagnosed with moderate malnutrition, suspect remains applicable.   Pt ate 100% of breakfast this morning on FLD, but otherwise no meals recorded since DYS 1 upgrade.   Discussed transition to nocturnal feeds with nursing. Will check in tomorrow to conduct in person assessment.   Nutritionally Relevant Medications: MVI w/ minerals Potassium chloride  20 mEq BID daily Senna Thiamine  100 mg  Labs reviewed: Sodium 152 Potassium 3.1 Chloride 117 No recent CBG BUN 37 A1c 5.6  Admit weight: 86 kg  NUTRITION - FOCUSED PHYSICAL EXAM:  Deferred to follow up  Diet Order:   Diet Order             DIET - DYS 1 Fluid consistency: Thin  Diet effective now                   EDUCATION NEEDS:   Not appropriate for education at this time  Skin:  Skin Assessment: Reviewed RN Assessment  Last BM:  1/13  Height:   Ht Readings from Last 1 Encounters:  12/09/24 5' 10 (1.778 m)    Weight:   Wt Readings from Last 1 Encounters:  12/09/24 86 kg    Ideal Body Weight:  75.45 kg  BMI:  Body mass index is 27.2 kg/m.  Estimated Nutritional Needs:   Kcal:  1800-2000  Protein:  90-115g  Fluid:  1.8-2L    Josette Glance, MS, RDN, LDN Clinical Dietitian I Please reach out via secure chat

## 2024-12-10 NOTE — Progress Notes (Signed)
 "                                                        PROGRESS NOTE   Subjective/Complaints: Patient is getting occupational therapy.  He is in bed with a Dobbhoff tube.  OT notes that he has a increased latency of response. Patient denies any pains.  He easily gets winded during therapy activities.  No cough has been noted. Review of systems limited by cognition he does not answer all questions   Objective:   DG Chest 2 View Result Date: 12/09/2024 EXAM: 2 VIEW(S) XRAY OF THE CHEST 12/09/2024 07:26:37 PM COMPARISON: 12/05/2024 CLINICAL HISTORY: Fever FINDINGS: LINES, TUBES AND DEVICES: Enteric tube courses below diaphragm with tip out of field of view. Right PICC line terminates in region of superior cavoatrial junction. LUNGS AND PLEURA: Small bilateral pleural effusions. Mild bibasilar heterogeneous airspace opacities. No pneumothorax. HEART AND MEDIASTINUM: No acute abnormality of the cardiac and mediastinal silhouettes. BONES AND SOFT TISSUES: No acute osseous abnormality. IMPRESSION: 1. Mild bibasilar heterogeneous airspace opacities. 2. Small bilateral pleural effusions. Electronically signed by: Morgane Naveau MD 12/09/2024 07:36 PM EST RP Workstation: [id number]   DG Swallowing Func-Speech Pathology Result Date: 12/09/2024 Table formatting from the original result was not included. Modified Barium Swallow Study Patient Details Name: Dwayne [name] MRN: [id number] Date of Birth: October 03, 1946 Today's Date: 12/09/2024 HPI/PMH: HPI: 79 yo male presenting to ED 1/10 with headache and lethargy x24 hours as well as progressive confusion and L sided weakness s/p falls x2. CTH showed large acute R temporal parenchymal hemorrhage with moderate edema and leftward midline shift, increasing in size on repeat imaging. 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 CT Cervical Spine shows moderate multilevel cervical disc degeneration and convex curvature of the lower  cervical spine. Pt initially passed the [facility] 1/10 but SLP was consulted 1/11 due to change in mentation. PMH includes DVT, PE on anticoagulation, HTN, retroperitoneal bleed, arrhythmia Clinical Impression: Pt exhibits mild oral dysphagia secondary to cognitive deficits. This impacts labial seal with limited awareness of anterior loss. Trace oral residuals are noted along his tongue and palate. The pharyngeal phase is overall functional despite chronic anterior curvature of the cervical spine (see CT 1/10) but epiglottic inversion and laryngeal elevation are complete. No penetration/aspiration occurs with all consistencies regardless of volume and rate. He masticated the 13 mm barium tablet instead of swallowing it whole but with noted esophageal retention. Given fluctuating alertness and cognitive impairment, will start with full liquids. Give meds whole with puree. Full supervision to ensure he is positioned upright and fully alert will be necessary. Expect good prognosis to advance quickly with ongoing intervention. Factors that may increase risk of adverse event in presence of aspiration Noe & Lianne 2021): Factors that may increase risk of adverse event in presence of aspiration Noe & Lianne 2021): Reduced cognitive function; Limited mobility; Frail or deconditioned Recommendations/Plan: Swallowing Evaluation Recommendations Swallowing Evaluation Recommendations Recommendations: PO diet PO Diet Recommendation: Full liquid diet Liquid Administration via: Spoon; Cup; Straw Medication Administration: Whole meds with puree Supervision: Full assist for feeding; Full supervision/cueing for swallowing strategies Swallowing strategies  : Minimize environmental distractions; Slow rate; Small bites/sips Postural changes: Position pt fully upright for meals Oral care recommendations: Oral care BID (2x/day)  Treatment Plan Treatment Plan Treatment recommendations: Therapy as outlined in treatment plan below  Follow-up recommendations: Acute inpatient rehab (3 hours/day) Functional status assessment: Patient has had a recent decline in their functional status and demonstrates the ability to make significant improvements in function in a reasonable and predictable amount of time. Treatment frequency: Min 2x/week Treatment duration: 2 weeks Interventions: Aspiration precaution training; Compensatory techniques; Patient/family education; Trials of upgraded texture/liquids Recommendations Recommendations for follow up therapy are one component of a multi-disciplinary discharge planning process, led by the attending physician.  Recommendations may be updated based on patient status, additional functional criteria and insurance authorization. Assessment: Orofacial Exam: Orofacial Exam Oral Cavity: Oral Hygiene: WFL Oral Cavity - Dentition: Adequate natural dentition Orofacial Anatomy: WFL Oral Motor/Sensory Function: WFL Anatomy: Anatomy: Suspected cervical osteophytes Boluses Administered: Boluses Administered Boluses Administered: Thin liquids (Level 0); Mildly thick liquids (Level 2, nectar thick); Moderately thick liquids (Level 3, honey thick); Puree; Solid  Oral Impairment Domain: Oral Impairment Domain Lip Closure: Escape beyond mid-chin Tongue control during bolus hold: Cohesive bolus between tongue to palatal seal Bolus preparation/mastication: Timely and efficient chewing and mashing Bolus transport/lingual motion: Brisk tongue motion Oral residue: Trace residue lining oral structures Location of oral residue : Tongue; Palate Initiation of pharyngeal swallow : Pyriform sinuses  Pharyngeal Impairment Domain: Pharyngeal Impairment Domain Soft palate elevation: No bolus between soft palate (SP)/pharyngeal wall (PW) Laryngeal elevation: Complete superior movement of thyroid cartilage with complete approximation of arytenoids to epiglottic petiole Anterior hyoid excursion: Complete anterior movement Epiglottic movement:  Complete inversion Laryngeal vestibule closure: Complete, no air/contrast in laryngeal vestibule Pharyngeal stripping wave : Present - complete Pharyngeal contraction (A/P view only): N/A Pharyngoesophageal segment opening: Partial distention/partial duration, partial obstruction of flow Tongue base retraction: Trace column of contrast or air between tongue base and PPW Pharyngeal residue: Trace residue within or on pharyngeal structures Location of pharyngeal residue: Tongue base; Valleculae  Esophageal Impairment Domain: Esophageal Impairment Domain Esophageal clearance upright position: Complete clearance, esophageal coating Pill: Pill Consistency administered: Thin liquids (Level 0) Thin liquids (Level 0): [facility] Penetration/Aspiration Scale Score: Penetration/Aspiration Scale Score 1.  Material does not enter airway: Thin liquids (Level 0); Mildly thick liquids (Level 2, nectar thick); Moderately thick liquids (Level 3, honey thick); Puree; Solid; Pill Compensatory Strategies: Compensatory Strategies Compensatory strategies: No   General Information: Caregiver present: No  Diet Prior to this Study: NPO; Cortrak/Small bore NG tube   Temperature : Normal   Respiratory Status: Tachypneic   Supplemental O2: Nasal cannula   History of Recent Intubation: No  Behavior/Cognition: Alert; Requires cueing; Lethargic/Drowsy; Cooperative; Confused Self-Feeding Abilities: Needs assist with self-feeding Baseline vocal quality/speech: Normal Volitional Cough: Able to elicit Volitional Swallow: Able to elicit Exam Limitations: No limitations Goal Planning: Prognosis for improved oropharyngeal function: Good Barriers to Reach Goals: Cognitive deficits No data recorded Patient/Family Stated Goal: none stated Consulted and agree with results and recommendations: Patient Pain: Pain Assessment Pain Assessment: Faces Faces Pain Scale: 0 Pain Location: neck, restless sitting EOB reporting Its just uncomfortable Pain Intervention(s):  Monitored during session End of Session: Start Time:SLP Start Time (ACUTE ONLY): 1140 Stop Time: SLP Stop Time (ACUTE ONLY): 1200 Time Calculation:SLP Time Calculation (min) (ACUTE ONLY): 20 min Charges: SLP Evaluations $ SLP Speech Visit: 1 Visit SLP Evaluations $MBS Swallow: 1 Procedure $Swallowing Treatment: 1 Procedure $Speech Treatment for Individual: 1 Procedure SLP visit diagnosis: SLP Visit Diagnosis: Dysphagia, oral phase (R13.11) Past Medical History: Past Medical History: Diagnosis Date  Arrhythmia  Bilateral pulmonary embolism (HCC) 05/28/2016  03/03/16  DVT of lower limb, acute (HCC)   left popliteal tibial and peroneal vein  Hypertension   Retroperitoneal bleed 12/25/2016  Cough x 1 week on Xarelto  extensive abdominal wall hematoma tracking down to penis & scrotum Past Surgical History: Past Surgical History: Procedure Laterality Date  INGUINAL HERNIA REPAIR Left 01/22/2022  Procedure: OPEN LEFT INGUINAL HERNIA REPAIR WITH MESH;  Surgeon: Vernetta Berg, MD;  Location: [facility];  Service: General;  Laterality: Left;  INGUINAL HERNIA REPAIR Right 01/20/2024  Procedure: open right HERNIA REPAIR INGUINAL ADULT;  Surgeon: Vernetta Berg, MD;  Location: [facility];  Service: General;  Laterality: Right;  LMA  KNEE SURGERY   Damien Blumenthal, M.A., CCC-SLP Speech Language Pathology, Acute Rehabilitation Services Secure Chat preferred [phone] 12/09/2024, 1:04 PM  No results for input(s): WBC, HGB, HCT, PLT in the last 72 hours. Recent Labs    12/08/24 0606 12/08/24 1151 12/09/24 0550 12/09/24 1401  NA 160*   < > 158* 158*  K 3.4*  --   --   --   CL 127*  --   --   --   CO2 21*  --   --   --   GLUCOSE 139*  --   --   --   BUN 38*  --   --   --   CREATININE 1.07  --   --   --   CALCIUM 8.4*  --   --   --    < > = values in this interval not displayed.    Intake/Output Summary (Last 24 hours) at 12/10/2024 0936 Last data filed at 12/10/2024  0759 Gross per 24 hour  Intake 1907.17 ml  Output 500 ml  Net 1407.17 ml        Physical Exam: Vital Signs Blood pressure 136/63, pulse 72, temperature 99.6 F (37.6 C), temperature source Oral, resp. rate 18, height 5' 10 (1.778 m), weight 86 kg, SpO2 96%. HEENT bruising left periorbital including eyelid General: No acute distress Mood and affect patient keeps eyes closed, long latency of response Heart: Regular rate and rhythm no rubs murmurs or extra sounds Lungs: Clear to auscultation, breathing unlabored, no rales or wheezes Abdomen: Positive bowel sounds, soft nontender to palpation, nondistended Extremities: No clubbing, cyanosis, or edema Skin: No evidence of breakdown, pink maculopapular rash over back consistent with heat rash/miliaria  neurologic: Cranial nerves II through XII cannot cooperate, motor strength is 5/5 in bilateral deltoid, bicep, tricep, grip, hip flexor, knee extensors, ankle dorsiflexor and plantar flexor Sensory exam no wincing or withdrawal to pinch in the left fingers or toes Cerebellar exam does not cooperate Musculoskeletal: No pain with passive range of motion in the lower extremities and upper extremities through partial range no joint swelling    Assessment/Plan: 1. Functional deficits which require 3+ hours per day of interdisciplinary therapy in a comprehensive inpatient rehab setting. Physiatrist is providing close team supervision and 24 hour management of active medical problems listed below. Physiatrist and rehab team continue to assess barriers to discharge/monitor patient progress toward functional and medical goals  Care Tool:  Bathing              Bathing assist       Upper Body Dressing/Undressing Upper body dressing        Upper body assist      Lower Body Dressing/Undressing Lower body dressing  Lower body assist       Toileting Toileting    Toileting assist       Transfers Chair/bed  transfer  Transfers assist           Locomotion Ambulation   Ambulation assist              Walk 10 feet activity   Assist           Walk 50 feet activity   Assist           Walk 150 feet activity   Assist           Walk 10 feet on uneven surface  activity   Assist           Wheelchair     Assist               Wheelchair 50 feet with 2 turns activity    Assist            Wheelchair 150 feet activity     Assist          Blood pressure 136/63, pulse 72, temperature 99.6 F (37.6 C), temperature source Oral, resp. rate 18, height 5' 10 (1.778 m), weight 86 kg, SpO2 96%.  Medical Problem List and Plan: 1. 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 Pt was on xarelto  at time of the hemorrhage.              -patient may shower , cover picc line, dressings             -ELOS/Goals: 24-28 days, PT/OT/SLP min A             -Admit to CIR 2.  Antithrombotics: -DVT/anticoagulation:  Pharmaceutical: Lovenox - No plans to resume Xarelto               -antiplatelet therapy: N/A 3. Pain Management: Tylenol  as needed 4. Mood/Behavior/Sleep: Provide emotional support             -antipsychotic agents: N/A 5. Neuropsych/cognition: This patient is not capable of making decisions on his own behalf. 6. Skin/Wound Care: Routine skin checks 7. Fluids/Electrolytes/Nutrition: Routine in and outs with follow-up chemistries 8.  History of recurrent DVT/PE.  Xarelto  reversed with Kcentra .  As per hematology services Dr.Sherill no immediate needs to resume anticoagulation or place IVC filter.  Hematology note indicates Dr. Cloretta following closely and will make further recommendations 9.  Hypertension.  Coreg  3.125 mg twice daily.  Monitor with increased mobility Vitals:   12/10/24 0536 12/10/24 0923  BP: 136/63 (!) (P)  148/67  Pulse: 72 (!) (P) 54  Resp:    Temp:    SpO2:  (P) 97%    10. Dysphagia.  Currently on a full liquid diet after MBS 12/09/2024.  Supplemental tube feeds with Cortrak and follow-up dietary services 11. Leukocytosis. Recheck labs tomorrow, last WBC 12.7 on 1/10    Latest Ref Rng & Units 12/05/2024    7:38 PM 12/05/2024   10:24 AM 12/05/2024   10:15 AM  CBC  WBC 4.0 - 10.5 K/uL 12.7   12.5   Hemoglobin 13.0 - 17.0 g/dL 84.9  84.6  85.0   Hematocrit 39.0 - 52.0 % 42.6  45.0  43.2   Platelets 150 - 400 K/uL 162   208     12. Fevers. WBC mildly elevated but stable Neurology suspecting central fevers. Negative CXR. U/A was negative  on 12/05/24   Hypernatremia- recheck Improved from 160.  Was getting hyptertonic saline on acute, no need for further intervention, will monitor     Latest Ref Rng & Units 12/10/2024    9:57 AM 12/09/2024    2:01 PM 12/09/2024    5:50 AM  BMP  Glucose 70 - 99 mg/dL 861     BUN 8 - 23 mg/dL 37     Creatinine 9.38 - 1.24 mg/dL 9.07     Sodium 864 - 854 mmol/L 152  158  158   Potassium 3.5 - 5.1 mmol/L 3.1     Chloride 98 - 111 mmol/L 117     CO2 22 - 32 mmol/L 26     Calcium 8.9 - 10.3 mg/dL 7.9     BUN still up , increase free H20 which should help Na+ Will need to supplement K+  LOS: 1 days A FACE TO FACE EVALUATION WAS PERFORMED  Prentice FORBES Compton 12/10/2024, 9:36 AM     "

## 2024-12-10 NOTE — Evaluation (Addendum)
 Occupational Therapy Assessment and Plan  Patient Details  Name: Dwayne [name] MRN: [id number] Date of Birth: 1946/09/29  OT Diagnosis: abnormal posture, altered mental status, cognitive deficits, disturbance of vision, flaccid hemiplegia and hemiparesis, hemiplegia affecting non-dominant side, muscle weakness (generalized), and swelling of limb Rehab Potential: Rehab Potential (ACUTE ONLY): Fair ELOS: 4-5 weeks   Today's Date: 12/10/2024 OT Individual Time: 0850- 1000 OT Treatment time:  70 min       Hospital Problem: Principal Problem:   Traumatic subdural hematoma (HCC)   Past Medical History:  Past Medical History:  Diagnosis Date   Arrhythmia    Bilateral pulmonary embolism (HCC) 05/28/2016   03/03/16   DVT of lower limb, acute (HCC)    left popliteal tibial and peroneal vein   Hypertension    Retroperitoneal bleed 12/25/2016   Cough x 1 week on Xarelto  extensive abdominal wall hematoma tracking down to penis & scrotum   Past Surgical History:  Past Surgical History:  Procedure Laterality Date   INGUINAL HERNIA REPAIR Left 01/22/2022   Procedure: OPEN LEFT INGUINAL HERNIA REPAIR WITH MESH;  Surgeon: Vernetta Berg, MD;  Location: [facility];  Service: General;  Laterality: Left;   INGUINAL HERNIA REPAIR Right 01/20/2024   Procedure: open right HERNIA REPAIR INGUINAL ADULT;  Surgeon: Vernetta Berg, MD;  Location: 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 Per chart review patient lives with spouse independent living facility/[facility]. 1 level home with level entry. Presented 12/05/2024 with headache and poor appetite as well as a fall while going to the  bathroom x 2. Denied loss of consciousness.. Cranial CT scan showed large acute right temporal parenchymal hemorrhage with moderate edema and a 6 mm leftward midline shift. Left frontal scalp soft tissue swelling. CT cervical spine negative. CT maxillofacial showed acute at most minimally displaced bilateral nasal bone fractures. Nasal and left-sided facial soft tissue swelling. CTA head and neck showed no underlying aneurysm or vascular malformation identified. Venous Doppler studies 12/05/2024 showed chronic DVT left femoral vein. Patient's Xarelto  was reversed with Kcentra . Neurosurgery consulted/Dr. Louis advised conservative care. Hospital course 12/06/2024 patient with some altered mental status with follow-up CT/MRI showed large right hemispheric hemorrhage with surrounding confluent edema. Small right subdural hematoma and trace intraventricular hemorrhage. Intracranial mass effect with leftward midline shift 9 mm.Patient transferred to CIR on 12/09/2024 .    Patient currently requires total with basic self-care skills secondary to decreased cardiorespiratoy endurance, abnormal tone, decreased coordination, and decreased motor planning, decreased visual perceptual skills and decreased visual motor skills, decreased attention to left, left side neglect, and decreased motor planning, decreased initiation, decreased attention, decreased awareness, decreased problem solving, decreased safety awareness, decreased memory, and delayed processing, and decreased sitting balance, decreased standing balance, decreased postural control, hemiplegia, and decreased balance strategies.  Prior to hospitalization, patient could complete BADL/IADL with independent .  Patient will benefit from skilled intervention to decrease level of assist with basic self-care skills prior to discharge home with care partner.  Anticipate patient will require moderate physical assestance and follow up home health and follow up  outpatient.  OT - End of Session Activity Tolerance: Tolerates < 10 min activity with changes in vital signs Endurance Deficit: Yes  Endurance Deficit Description: Varying pulse - ranges from 33-56 during eval OT Assessment Rehab Potential (ACUTE ONLY): Fair OT Barriers to Discharge: Behavior OT Barriers to Discharge Comments: severe left neglect, pushing behavior OT Patient demonstrates impairments in the following area(s): Balance;Nutrition;Vision;Behavior;Pain;Cognition;Perception;Edema;Safety;Endurance;Sensory;Motor OT Basic ADL's Functional Problem(s): Eating;Grooming;Bathing;Dressing;Toileting OT Advanced ADL's Functional Problem(s): None OT Transfers Functional Problem(s): Toilet;Tub/Shower OT Additional Impairment(s): Fuctional Use of Upper Extremity OT Plan OT Intensity: Minimum of 1-2 x/day, 45 to 90 minutes OT Frequency: 5 out of 7 days OT Duration/Estimated Length of Stay: 4-5 weeks OT Treatment/Interventions: Balance/vestibular training;Functional electrical stimulation;Neuromuscular re-education;Patient/family education;Self Care/advanced ADL retraining;Splinting/orthotics;Therapeutic Exercise;UE/LE Coordination activities;Wheelchair propulsion/positioning;Cognitive remediation/compensation;Discharge planning;DME/adaptive equipment instruction;Functional mobility training;Pain management;Therapeutic Activities;UE/LE Strength taining/ROM;Visual/perceptual remediation/compensation OT Self Feeding Anticipated Outcome(s): Min assist OT Basic Self-Care Anticipated Outcome(s): Mod assist OT Toileting Anticipated Outcome(s): Mod assist OT Bathroom Transfers Anticipated Outcome(s): Mod assist OT Recommendation Recommendations for Other Services: Neuropsych consult Patient destination: Home Follow Up Recommendations: Home health OT;Outpatient OT Equipment Recommended: To be determined   OT Evaluation Precautions/Restrictions  Precautions Precautions:  Fall Precaution/Restrictions Comments: Left neglect, does not cross midline to left Restrictions Weight Bearing Restrictions Per Provider Order: No   Vital Signs Therapy Vitals Pulse Rate: (!) (P) 54 BP: (!) (P) 148/67 Patient Position (if appropriate): (P) Lying Oxygen Therapy SpO2: (P) 97 % Pain Pain Assessment Pain Scale: Faces Pain Score: 0-No pain Faces Pain Scale: No hurt Home Living/Prior Functioning Home Living Family/patient expects to be discharged to:: Private residence Living Arrangements: Spouse/significant other Available Help at Discharge: Family Type of Home: Independent living facility Home Access: Level entry Home Layout: One level Bathroom Shower/Tub: Health Visitor: Handicapped height Additional Comments: lives with wife in indep apartment at [employer] With: Spouse IADL History Homemaking Responsibilities: No Current License: Yes Occupation: Retired Type of Occupation: systems analyst Prior Function Level of Independence: Independent with basic ADLs, Independent with gait  Able to Take Stairs?: Yes Driving: Yes Vocation: Retired Administrator, Sports Baseline Vision/History: 1 Wears glasses Ability to See in Adequate Light: 1 Impaired Patient Visual Report: Eye fatigue/eye pain/headache;Peripheral vision impairment Vision Assessment?: Vision impaired- to be further tested in functional context Additional Comments: R gaze, can track to midline, maintains left eye closed Perception  Perception: Impaired Praxis Praxis: Impaired Praxis Impairment Details: Initiation;Perseveration;Motor planning Cognition Cognition Overall Cognitive Status: Impaired/Different from baseline Arousal/Alertness: Lethargic Orientation Level: Person;Place;Situation Person: Oriented Place: Oriented Situation: Oriented Memory: Impaired Memory Impairment: Retrieval deficit Attention: Focused;Sustained Focused Attention: Appears intact Sustained  Attention: Impaired Sustained Attention Impairment: Verbal basic;Functional basic Awareness: Impaired Awareness Impairment: Intellectual impairment;Emergent impairment;Anticipatory impairment Problem Solving: Impaired Problem Solving Impairment: Verbal basic;Functional basic Executive Function: Organizing;Initiating;Decision Making Organizing: Impaired Organizing Impairment: Verbal basic;Functional basic Decision Making: Impaired Decision Making Impairment: Verbal basic;Functional basic Initiating: Impaired Initiating Impairment: Verbal basic;Functional basic Behaviors: Perseveration;Impulsive Safety/Judgment: Impaired Brief Interview for Mental Status (BIMS) Repetition of Three Words (First Attempt): 3 Temporal Orientation: Year: Correct Temporal Orientation: Month: Accurate within 5 days Temporal Orientation: Day: Incorrect Recall: Sock: Yes, no cue required Recall: Blue: Yes, no cue required Recall: Bed: Yes, no cue required BIMS Summary Score: 14 Sensation Sensation Light Touch: Impaired by gross assessment Hot/Cold: Not tested Proprioception: Impaired by gross assessment Stereognosis: Not tested Coordination Gross Motor Movements are Fluid and Coordinated: No Fine Motor Movements are Fluid and Coordinated: No Motor  Motor Motor: Hemiplegia;Abnormal postural alignment and control;Abnormal tone Motor - Skilled Clinical Observations: Dense hemiplegia, pushes toward weaker left side  Trunk/Postural Assessment  Cervical Assessment Cervical Assessment: Exceptions to [facility] (head  and eyes turned to right) Thoracic Assessment Thoracic Assessment: Exceptions to [facility] (flexion and lateral flexion toward left) Lumbar Assessment Lumbar Assessment: Exceptions to [facility] (posterior pelvis - pushes leftward) Postural Control Postural Control: Deficits on evaluation Head Control: Forward head rotated right Trunk Control: FLexed truk, left lateral flexion in sitting Postural  Limitations: Pushes strongly to left in sitting - resists weight shift right  Balance Balance Balance Assessed: Yes Static Sitting Balance Static Sitting - Balance Support: Right upper extremity supported;Feet supported Static Sitting - Level of Assistance: 2: Max assist Dynamic Sitting Balance Dynamic Sitting - Balance Support: Right upper extremity supported;Feet supported;During functional activity Dynamic Sitting - Level of Assistance: 1: +1 Total assist Static Standing Balance Static Standing - Balance Support: During functional activity Static Standing - Level of Assistance: 1: +1 Total assist Static Standing - Comment/# of Minutes: Only able to achieve partial stand Extremity/Trunk Assessment RUE Assessment RUE Assessment: Within Functional Limits LUE Assessment LUE Assessment: Exceptions to [facility] Active Range of Motion (AROM) Comments: Dense left hemiplegia, has movement - significantly delayed and absent sensation distal LUE LUE Body System: Neuro Brunstrum levels for arm and hand: Arm;Hand Brunstrum level for arm: Stage I Presynergy Brunstrum level for hand: Stage I Flaccidity  Care Tool Care Tool Self Care Eating Eating activity did not occur: Safety/medical concerns Eating Assist Level: Total Assistance - Patient < 25%    Oral Care    Oral Care Assist Level: Total assistance - Patient < 25%    Bathing     Body parts bathed by helper: Right arm;Left arm;Chest;Abdomen;Front perineal area;Buttocks;Face;Left lower leg;Right lower leg;Left upper leg;Right upper leg   Assist Level: Dependent - Patient 0%    Upper Body Dressing(including orthotics) Upper body dressing/undressing activity did not occur (including orthotics): Environmental limitations          Lower Body Dressing (excluding footwear)   What is the patient wearing?: Incontinence brief Assist for lower body dressing: 2 Helpers    Putting on/Taking off footwear   What is the patient wearing?: Ted  hose;Socks Assist for footwear: Dependent - Patient 0%       Care Tool Toileting Toileting activity Toileting Activity did not occur (Clothing management and hygiene only): N/A (no void or bm)       Care Tool Bed Mobility Roll left and right activity   Roll left and right assist level: Total Assistance - Patient < 25%    Sit to lying activity   Sit to lying assist level: Dependent - Patient 0%    Lying to sitting on side of bed activity   Lying to sitting on side of bed assist level: the ability to move from lying on the back to sitting on the side of the bed with no back support.: Total Assistance - Patient < 25%     Care Tool Transfers Sit to stand transfer   Sit to stand assist level: Dependent - Patient 0%    Chair/bed transfer Chair/bed transfer activity did not occur: Safety/medical concerns       Toilet transfer Toilet transfer activity did not occur: Safety/medical concerns       Care Tool Cognition  Expression of Ideas and Wants Expression of Ideas and Wants: 3. Some difficulty - exhibits some difficulty with expressing needs and ideas (e.g, some words or finishing thoughts) or speech is not clear  Understanding Verbal and Non-Verbal Content Understanding Verbal and Non-Verbal Content: 3. Usually understands - understands most conversations, but misses some part/intent of message.  Requires cues at times to understand   Memory/Recall Ability Memory/Recall Ability : Current season;That he or she is in a hospital/hospital unit   Refer to Care Plan for Long Term Goals  SHORT TERM GOAL WEEK 1 OT Short Term Goal 1 (Week 1): Patient will sit at edge of bed with mod assist prior to active transfer OT Short Term Goal 2 (Week 1): Patient will turn head and locate item slightly left of midline with mod cueing, and min physical assist OT Short Term Goal 3 (Week 1): Patient will complete oral care with mod assist while seated in wheelchair OT Short Term Goal 4 (Week 1):  Patient will locate left arm on body with min assist while seated in wheelchair  Recommendations for other services: Neuropsych   Skilled Therapeutic Intervention: Patient received supine in bed with eyes closed.  Patient sleeping initially but woke to gentle rocking and calling his name.  Patient with head and eyes turned to right throughout session, eyes did not cross midline to left.   Patient easily engaged, but very difficult to redirect once verbally engaged.  Patient assisted to sit at edge of bed, with total assist - patent pushing leftward and resisting attempts to shift weight to right.   Patient without clothing here, tube feeding running and connected to bedpost.  Obtained tilt in space chair for patient.  Patient left in bed at end of session with head of bed at 30* and bed alarm engaged.  Call bell in lap.   ADL ADL Eating: Unable to assess Grooming: Dependent Where Assessed-Grooming: Bed level Upper Body Bathing: Dependent Where Assessed-Upper Body Bathing: Bed level Lower Body Bathing: Dependent Where Assessed-Lower Body Bathing: Bed level Upper Body Dressing: Unable to assess Lower Body Dressing: Unable to assess Toileting: Dependent Where Assessed-Toileting: Bed level Toilet Transfer: Unable to assess Toilet Transfer Method: Unable to assess Tub/Shower Transfer: Unable to assess Tub/Shower Transfer Method: Unable to assess Film/video Editor: Unable to assess Film/video Editor Method: Unable to assess Mobility  Bed Mobility Bed Mobility: Rolling Left;Left Sidelying to Sit Rolling Left: Dependent - Patient equal 0% Left Sidelying to Sit: Dependent - Patient equal 0% Transfers Sit to Stand: Total Assistance - Patient < 25% Stand to Sit: Total Assistance - Patient < 25%   Discharge Criteria: Patient will be discharged from OT if patient refuses treatment 3 consecutive times without medical reason, if treatment goals not met, if there is a change in  medical status, if patient makes no progress towards goals or if patient is discharged from hospital.  The above assessment, treatment plan, treatment alternatives and goals were discussed and mutually agreed upon: No family available/patient unable  Fransico Allean HERO 12/10/2024, 12:55 PM

## 2024-12-10 NOTE — Plan of Care (Signed)
" °  Problem: RH Swallowing Goal: LTG Patient will consume least restrictive diet using compensatory strategies with assistance (SLP) Description: LTG:  Patient will consume least restrictive diet using compensatory strategies with assistance (SLP) Flowsheets (Taken 12/10/2024 1235) LTG: Pt Patient will consume least restrictive diet using compensatory strategies with assistance of (SLP): Moderate Assistance - Patient 50 - 74%   Problem: RH Problem Solving Goal: LTG Patient will demonstrate problem solving for (SLP) Description: LTG:  Patient will demonstrate problem solving for basic/complex daily situations with cues  (SLP) Flowsheets (Taken 12/10/2024 1235) LTG: Patient will demonstrate problem solving for (SLP): Basic daily situations LTG Patient will demonstrate problem solving for: Moderate Assistance - Patient 50 - 74%   Problem: RH Memory Goal: LTG Patient will follow step by step directions w/cues (SLP) Description: LTG: Patient will follow step by step directions with cues (SLP). Flowsheets (Taken 12/10/2024 1235) LTG: Patient will follow step by step directions: 2 steps LTG: Patient will follow step by step directions w/cues: Moderate Assistance - Patient 50 - 74%   Problem: RH Attention Goal: LTG Patient will demonstrate this level of attention during functional activites (SLP) Description: LTG:  Patient will will demonstrate this level of attention during functional activites (SLP) Flowsheets (Taken 12/10/2024 1235) Patient will demonstrate during cognitive/linguistic activities the attention type of: Sustained Patient will demonstrate this level of attention during cognitive/linguistic activities in: Controlled LTG: Patient will demonstrate this level of attention during cognitive/linguistic activities with assistance of (SLP): Moderate Assistance - Patient 50 - 74% Number of minutes patient will demonstrate attention during cognitive/linguistic activities: 5   Problem: RH  Awareness Goal: LTG: Patient will demonstrate awareness during functional activites type of (SLP) Description: LTG: Patient will demonstrate awareness during functional activites type of (SLP) Flowsheets (Taken 12/10/2024 1235) Patient will demonstrate during cognitive/linguistic activities awareness type of: Intellectual LTG: Patient will demonstrate awareness during cognitive/linguistic activities with assistance of (SLP): Moderate Assistance - Patient 50 - 74%   "

## 2024-12-10 NOTE — Progress Notes (Signed)
 MEWS Progress Note  Patient Details Name: Dwayne [name] MRN: [id number] DOB: 07-Jan-1946 Today's Date: 12/10/2024   MEWS Flowsheet Documentation:  Assess: MEWS Score Temp: 98.6 F (37 C) BP: (!) 151/72 MAP (mmHg): 91 Pulse Rate: 90 Resp: (!) 30 Level of Consciousness: Responds to Voice SpO2: 97 % O2 Device: Room Air Patient Activity (if Appropriate): In bed Assess: MEWS Score MEWS Temp: 0 MEWS Systolic: 0 MEWS Pulse: 0 MEWS RR: 2 MEWS LOC: 1 MEWS Score: 3 MEWS Score Color: Yellow Assess: SIRS CRITERIA SIRS Temperature : 0 SIRS Respirations : 1 SIRS Pulse: 0 SIRS WBC: 0 SIRS Score Sum : 1 SIRS Temperature : 0 SIRS Pulse: 0 SIRS Respirations : 1 SIRS WBC: 0 SIRS Score Sum : 1 Assess: if the MEWS score is Yellow or Red Were vital signs accurate and taken at a resting state?: Yes Does the patient meet 2 or more of the SIRS criteria?: Yes Does the patient have a confirmed or suspected source of infection?: No MEWS guidelines implemented : Yes, yellow Treat MEWS Interventions: Considered administering scheduled or prn medications/treatments as ordered Take Vital Signs Increase Vital Sign Frequency : Yellow: Q2hr x1, continue Q4hrs until patient remains green for 12hrs Escalate MEWS: Escalate: Yellow: Discuss with charge nurse and consider notifying provider and/or RRT Notify: Charge Nurse/RN Name of Charge Nurse/RN Notified: [place] Provider Notification Provider Name/Title: Rolan Haggard Date Provider Notified: 12/10/24 Time Provider Notified: [id number] Method of Notification: Face-to-face Notification Reason: Change in status Provider response: No new orders Date of Provider Response: 12/10/24 Time of Provider Response: [id number]      [name] 12/10/2024, 1:39 PM Patient is calm and resting, no complaints of pain or shortness of breath, no statement of anxiety, PA notified

## 2024-12-11 DIAGNOSIS — E876 Hypokalemia: Secondary | ICD-10-CM | POA: Diagnosis not present

## 2024-12-11 DIAGNOSIS — I69391 Dysphagia following cerebral infarction: Secondary | ICD-10-CM | POA: Diagnosis not present

## 2024-12-11 DIAGNOSIS — E870 Hyperosmolality and hypernatremia: Secondary | ICD-10-CM | POA: Diagnosis not present

## 2024-12-11 DIAGNOSIS — I6381 Other cerebral infarction due to occlusion or stenosis of small artery: Secondary | ICD-10-CM | POA: Diagnosis not present

## 2024-12-11 DIAGNOSIS — I69319 Unspecified symptoms and signs involving cognitive functions following cerebral infarction: Secondary | ICD-10-CM | POA: Diagnosis not present

## 2024-12-11 LAB — BASIC METABOLIC PANEL WITH GFR
Anion gap: 8 (ref 5–15)
BUN: 35 mg/dL — ABNORMAL HIGH (ref 8–23)
CO2: 27 mmol/L (ref 22–32)
Calcium: 7.8 mg/dL — ABNORMAL LOW (ref 8.9–10.3)
Chloride: 114 mmol/L — ABNORMAL HIGH (ref 98–111)
Creatinine, Ser: 0.85 mg/dL (ref 0.61–1.24)
GFR, Estimated: >60 mL/min
Glucose, Bld: 115 mg/dL — ABNORMAL HIGH (ref 70–99)
Potassium: 3.3 mmol/L — ABNORMAL LOW (ref 3.5–5.1)
Sodium: 149 mmol/L — ABNORMAL HIGH (ref 135–145)

## 2024-12-11 NOTE — Progress Notes (Signed)
 Responded to consult for irritation around PICC line dressing. See photo from 1/16 pre dressing change. Assessment of area after dressing removal revealed irritation under biopatch as well the irritation at dressing edge. Biopatch was not replaced due to the irritation. Dressing was replaced using Suresite dressing. See photos 1/16 post dressing change. Unit RN notified of findings and advised to monitor for any changes. Will consult IV team as needed.

## 2024-12-11 NOTE — IPOC Note (Signed)
 Overall Plan of Care [facility]) Patient Details Name: Dwayne [name] MRN: [id number] DOB: [birth year]  Admitting Diagnosis: ICH  Hospital Problems: Principal Problem: ICH     Functional Problem List: Nursing Bladder, Bowel, Edema, Endurance, Medication Management, Nutrition, Pain, Safety, Skin Integrity  PT Balance, Perception, Safety, Endurance, Motor, Sensory  OT Balance, Nutrition, Vision, Behavior, Pain, Cognition, Perception, Edema, Safety, Endurance, Sensory, Motor  SLP Cognition, Safety, Nutrition  TR         Basic ADLs: OT Eating, Grooming, Bathing, Dressing, Toileting     Advanced  ADLs: OT None     Transfers: PT Bed Mobility, Bed to Chair, Car, Occupational Psychologist, Research Scientist (life Sciences): PT Ambulation, Psychologist, [employer]     Additional Impairments: OT Fuctional Use of Upper Extremity  SLP Social Cognition   Problem Solving, Memory, Attention, Awareness  TR      Anticipated Outcomes Item Anticipated Outcome  Self Feeding Min assist  Swallowing  modA   Basic self-care  Mod assist  Toileting  Mod assist   Bathroom Transfers Mod assist  Bowel/Bladder  manage bowels with medications/ manage bladder with toileting assistance  Transfers  mod A  Locomotion  Mod A  Communication     Cognition  modA  Pain  <4 w/ prn  Safety/Judgment  manage safety with min assistance   Therapy Plan: PT Intensity: Minimum of 1-2 x/day ,45 to 90 minutes PT Frequency: 5 out of 7 days PT Duration Estimated Length of Stay: 4 weeks OT Intensity: Minimum of 1-2 x/day, 45 to 90 minutes OT Frequency: 5 out of 7 days OT Duration/Estimated Length of Stay: 4-5 weeks SLP Intensity: Minumum of 1-2 x/day, 30 to 90 minutes SLP Frequency: 3 to 5 out of 7 days SLP Duration/Estimated Length of Stay: 4 weeks   Team Interventions: Nursing Interventions Patient/Family Education, Skin Care/Wound Management, Bladder Management, Bowel Management, Disease Management/Prevention,  Pain Management, Medication Management, Discharge Planning, Dysphagia/Aspiration Precaution Training  PT interventions Ambulation/gait training, Community reintegration, Neuromuscular re-education, UE/LE Strength taining/ROM, Wheelchair propulsion/positioning, UE/LE Coordination activities, Therapeutic Activities, Discharge planning, Warden/ranger, Functional mobility training, Patient/family education, Therapeutic Exercise  OT Interventions Balance/vestibular training, Functional electrical stimulation, Neuromuscular re-education, Patient/family education, Self Care/advanced ADL retraining, Splinting/orthotics, Therapeutic Exercise, UE/LE Coordination activities, Wheelchair propulsion/positioning, Cognitive remediation/compensation, Discharge planning, DME/adaptive equipment instruction, Functional mobility training, Pain management, Therapeutic Activities, UE/LE Strength taining/ROM, Visual/perceptual remediation/compensation  SLP Interventions Cognitive remediation/compensation, Dysphagia/aspiration precaution training, Internal/external aids, Cueing hierarchy, Environmental controls, Therapeutic Activities, Functional tasks, Patient/family education  TR Interventions    SW/CM Interventions Discharge Planning, Psychosocial Support, Patient/Family Education   Barriers to Discharge MD  Medical stability  Nursing Decreased caregiver support, Home environment access/layout, Incontinence Discharge: [facility]  Discharge Home Layout: One level  Discharge Home Access: Level entry;Limitations of Caregiver: min assist  PT Decreased caregiver support pt may return to facility at different LOC.  OT Behavior severe left neglect, pushing behavior  SLP      SW       Team Discharge Planning: Destination: PT-Home ,OT- Home , SLP-Home Projected Follow-up: PT-Home health PT, OT-  Home health OT, Outpatient OT, SLP-24 hour supervision/assistance, Home Health SLP Projected Equipment Needs: PT-To  be determined, OT- To be determined, SLP-None recommended by SLP Equipment Details: PT-pt has no DME PTA, OT-  Patient/family involved in discharge planning: PT- Patient, Family member/caregiver,  OT-Patient unable/family or caregiver not available, SLP-Patient, Family member/caregiver  MD ELOS: 21-24d Medical Rehab Prognosis:  Fair Assessment: The patient has been admitted  for CIR therapies with the diagnosis of ICH. The team will be addressing functional mobility, strength, stamina, balance, safety, adaptive techniques and equipment, self-care, bowel and bladder mgt, patient and caregiver education, Nutrition, level of alertness, severe left neglect. Goals have been set at Mod A. Anticipated discharge destination is Home with assist vs SNF.        See Team Conference Notes for weekly updates to the plan of care

## 2024-12-11 NOTE — Progress Notes (Signed)
" °   12/11/24 0919  Vitals  Temp 98.2 F (36.8 C)  Temp Source Oral  BP 138/72  MAP (mmHg) 89  BP Location Left Arm  BP Method Automatic  Patient Position (if appropriate) Sitting  Pulse Rate 89  Pulse Rate Source Dinamap  Resp (!) 32  Level of Consciousness  Level of Consciousness Alert  MEWS COLOR  MEWS Score Color Yellow  Oxygen Therapy  SpO2 96 %  O2 Device Room Air  MEWS Score  MEWS Temp 0  MEWS Systolic 0  MEWS Pulse 0  MEWS RR 2  MEWS LOC 0  MEWS Score 2   Doctor notified and charge notified, Doctor assessed patient, states resp is due to postural issues, posture addressed, resp will be continued to be monitered "

## 2024-12-11 NOTE — Progress Notes (Signed)
 "                                                        PROGRESS NOTE   Subjective/Complaints: Elevated RR, CXR with atelectasis, WBCs are down, fever has resolved  No cough  Review of systems limited by cognition he does not answer all questions   Objective:   DG Chest 2 View Result Date: 12/09/2024 EXAM: 2 VIEW(S) XRAY OF THE CHEST 12/09/2024 07:26:37 PM COMPARISON: 12/05/2024 CLINICAL HISTORY: Fever FINDINGS: LINES, TUBES AND DEVICES: Enteric tube courses below diaphragm with tip out of field of view. Right PICC line terminates in region of superior cavoatrial junction. LUNGS AND PLEURA: Small bilateral pleural effusions. Mild bibasilar heterogeneous airspace opacities. No pneumothorax. HEART AND MEDIASTINUM: No acute abnormality of the cardiac and mediastinal silhouettes. BONES AND SOFT TISSUES: No acute osseous abnormality. IMPRESSION: 1. Mild bibasilar heterogeneous airspace opacities. 2. Small bilateral pleural effusions. Electronically signed by: Morgane Naveau MD 12/09/2024 07:36 PM EST RP Workstation: [id number]   DG Swallowing Func-Speech Pathology Result Date: 12/09/2024 Table formatting from the original result was not included. Modified Barium Swallow Study Patient Details Name: Dwayne [name] MRN: [id number] Date of Birth: May 07, 1946 Today's Date: 12/09/2024 HPI/PMH: HPI: 79 yo male presenting to ED 1/10 with headache and lethargy x24 hours as well as progressive confusion and L sided weakness s/p falls x2. CTH showed large acute R temporal parenchymal hemorrhage with moderate edema and leftward midline shift, increasing in size on repeat imaging. 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 CT Cervical Spine shows moderate multilevel cervical disc degeneration and convex curvature of the lower cervical spine. Pt initially passed the [facility] 1/10 but SLP was consulted 1/11 due to change in mentation. PMH includes DVT, PE on anticoagulation, HTN,  retroperitoneal bleed, arrhythmia Clinical Impression: Pt exhibits mild oral dysphagia secondary to cognitive deficits. This impacts labial seal with limited awareness of anterior loss. Trace oral residuals are noted along his tongue and palate. The pharyngeal phase is overall functional despite chronic anterior curvature of the cervical spine (see CT 1/10) but epiglottic inversion and laryngeal elevation are complete. No penetration/aspiration occurs with all consistencies regardless of volume and rate. He masticated the 13 mm barium tablet instead of swallowing it whole but with noted esophageal retention. Given fluctuating alertness and cognitive impairment, will start with full liquids. Give meds whole with puree. Full supervision to ensure he is positioned upright and fully alert will be necessary. Expect good prognosis to advance quickly with ongoing intervention. Factors that may increase risk of adverse event in presence of aspiration Dwayne [name] & Dwayne [name] 2021): Factors that may increase risk of adverse event in presence of aspiration Dwayne [name] & Dwayne [name] 2021): Reduced cognitive function; Limited mobility; Frail or deconditioned Recommendations/Plan: Swallowing Evaluation Recommendations Swallowing Evaluation Recommendations Recommendations: PO diet PO Diet Recommendation: Full liquid diet Liquid Administration via: Spoon; Cup; Straw Medication Administration: Whole meds with puree Supervision: Full assist for feeding; Full supervision/cueing for swallowing strategies Swallowing strategies  : Minimize environmental distractions; Slow rate; Small bites/sips Postural changes: Position pt fully upright for meals Oral care recommendations: Oral care BID (2x/day) Treatment Plan Treatment Plan Treatment recommendations: Therapy as outlined in treatment plan below Follow-up recommendations: Acute inpatient rehab (3 hours/day) Functional status assessment: Patient has had a recent  decline in their functional status and  demonstrates the ability to make significant improvements in function in a reasonable and predictable amount of time. Treatment frequency: Min 2x/week Treatment duration: 2 weeks Interventions: Aspiration precaution training; Compensatory techniques; Patient/family education; Trials of upgraded texture/liquids Recommendations Recommendations for follow up therapy are one component of a multi-disciplinary discharge planning process, led by the attending physician.  Recommendations may be updated based on patient status, additional functional criteria and insurance authorization. Assessment: Orofacial Exam: Orofacial Exam Oral Cavity: Oral Hygiene: WFL Oral Cavity - Dentition: Adequate natural dentition Orofacial Anatomy: WFL Oral Motor/Sensory Function: WFL Anatomy: Anatomy: Suspected cervical osteophytes Boluses Administered: Boluses Administered Boluses Administered: Thin liquids (Level 0); Mildly thick liquids (Level 2, nectar thick); Moderately thick liquids (Level 3, honey thick); Puree; Solid  Oral Impairment Domain: Oral Impairment Domain Lip Closure: Escape beyond mid-chin Tongue control during bolus hold: Cohesive bolus between tongue to palatal seal Bolus preparation/mastication: Timely and efficient chewing and mashing Bolus transport/lingual motion: Brisk tongue motion Oral residue: Trace residue lining oral structures Location of oral residue : Tongue; Palate Initiation of pharyngeal swallow : Pyriform sinuses  Pharyngeal Impairment Domain: Pharyngeal Impairment Domain Soft palate elevation: No bolus between soft palate (SP)/pharyngeal wall (PW) Laryngeal elevation: Complete superior movement of thyroid cartilage with complete approximation of arytenoids to epiglottic petiole Anterior hyoid excursion: Complete anterior movement Epiglottic movement: Complete inversion Laryngeal vestibule closure: Complete, no air/contrast in laryngeal vestibule Pharyngeal stripping wave : Present - complete Pharyngeal  contraction (A/P view only): N/A Pharyngoesophageal segment opening: Partial distention/partial duration, partial obstruction of flow Tongue base retraction: Trace column of contrast or air between tongue base and PPW Pharyngeal residue: Trace residue within or on pharyngeal structures Location of pharyngeal residue: Tongue base; Valleculae  Esophageal Impairment Domain: Esophageal Impairment Domain Esophageal clearance upright position: Complete clearance, esophageal coating Pill: Pill Consistency administered: Thin liquids (Level 0) Thin liquids (Level 0): [facility] Penetration/Aspiration Scale Score: Penetration/Aspiration Scale Score 1.  Material does not enter airway: Thin liquids (Level 0); Mildly thick liquids (Level 2, nectar thick); Moderately thick liquids (Level 3, honey thick); Puree; Solid; Pill Compensatory Strategies: Compensatory Strategies Compensatory strategies: No   General Information: Caregiver present: No  Diet Prior to this Study: NPO; Cortrak/Small bore NG tube   Temperature : Normal   Respiratory Status: Tachypneic   Supplemental O2: Nasal cannula   History of Recent Intubation: No  Behavior/Cognition: Alert; Requires cueing; Lethargic/Drowsy; Cooperative; Confused Self-Feeding Abilities: Needs assist with self-feeding Baseline vocal quality/speech: Normal Volitional Cough: Able to elicit Volitional Swallow: Able to elicit Exam Limitations: No limitations Goal Planning: Prognosis for improved oropharyngeal function: Good Barriers to Reach Goals: Cognitive deficits No data recorded Patient/Family Stated Goal: none stated Consulted and agree with results and recommendations: Patient Pain: Pain Assessment Pain Assessment: Faces Faces Pain Scale: 0 Pain Location: neck, restless sitting EOB reporting Its just uncomfortable Pain Intervention(s): Monitored during session End of Session: Start Time:SLP Start Time (ACUTE ONLY): 1140 Stop Time: SLP Stop Time (ACUTE ONLY): 1200 Time Calculation:SLP Time  Calculation (min) (ACUTE ONLY): 20 min Charges: SLP Evaluations $ SLP Speech Visit: 1 Visit SLP Evaluations $MBS Swallow: 1 Procedure $Swallowing Treatment: 1 Procedure $Speech Treatment for Individual: 1 Procedure SLP visit diagnosis: SLP Visit Diagnosis: Dysphagia, oral phase (R13.11) Past Medical History: Past Medical History: Diagnosis Date  Arrhythmia   Bilateral pulmonary embolism (HCC) 05/28/2016  03/03/16  DVT of lower limb, acute (HCC)   left popliteal tibial and peroneal vein  Hypertension   Retroperitoneal bleed  12/25/2016  Cough x 1 week on Xarelto  extensive abdominal wall hematoma tracking down to penis & scrotum Past Surgical History: Past Surgical History: Procedure Laterality Date  INGUINAL HERNIA REPAIR Left 01/22/2022  Procedure: OPEN LEFT INGUINAL HERNIA REPAIR WITH MESH;  Surgeon: Vernetta Berg, MD;  Location: [facility];  Service: General;  Laterality: Left;  INGUINAL HERNIA REPAIR Right 01/20/2024  Procedure: open right HERNIA REPAIR INGUINAL ADULT;  Surgeon: Vernetta Berg, MD;  Location: [facility];  Service: General;  Laterality: Right;  LMA  KNEE SURGERY   Damien Blumenthal, M.A., CCC-SLP Speech Language Pathology, Acute Rehabilitation Services Secure Chat preferred [phone] 12/09/2024, 1:04 PM  Recent Labs    12/10/24 0957  WBC 9.9  HGB 11.9*  HCT 35.9*  PLT 103*   Recent Labs    12/10/24 0957 12/11/24 0428  NA 152* 149*  K 3.1* 3.3*  CL 117* 114*  CO2 26 27  GLUCOSE 138* 115*  BUN 37* 35*  CREATININE 0.92 0.85  CALCIUM 7.9* 7.8*    Intake/Output Summary (Last 24 hours) at 12/11/2024 0829 Last data filed at 12/11/2024 0546 Gross per 24 hour  Intake 580 ml  Output 500 ml  Net 80 ml        Physical Exam: Vital Signs Blood pressure (!) 142/81, pulse 65, temperature (!) 97.4 F (36.3 C), temperature source Oral, resp. rate (!) 22, height 5' 10 (1.778 m), weight 86 kg, SpO2 96%. HEENT bruising left periorbital including  eyelid General: No acute distress Mood and affect patient keeps eyes closed, long latency of response Heart: Regular rate and rhythm no rubs murmurs or extra sounds Lungs: Clear to auscultation, breathing unlabored, no rales or wheezes Abdomen: Positive bowel sounds, soft nontender to palpation, nondistended Extremities: No clubbing, cyanosis, or edema Skin: No evidence of breakdown, pink maculopapular rash over back consistent with heat rash/miliaria  neurologic: Cranial nerves II through XII cannot cooperate, motor strength is 5/5 in bilateral deltoid, bicep, tricep, grip, hip flexor, knee extensors, ankle dorsiflexor and plantar flexor Sensory exam no wincing or withdrawal to pinch in the left fingers or toes Cerebellar exam does not cooperate Musculoskeletal: No pain with passive range of motion in the lower extremities and upper extremities through partial range no joint swelling    Assessment/Plan: 1. Functional deficits which require 3+ hours per day of interdisciplinary therapy in a comprehensive inpatient rehab setting. Physiatrist is providing close team supervision and 24 hour management of active medical problems listed below. Physiatrist and rehab team continue to assess barriers to discharge/monitor patient progress toward functional and medical goals  Care Tool:  Bathing        Body parts bathed by helper: Right arm, Left arm, Chest, Abdomen, Front perineal area, Buttocks, Face, Left lower leg, Right lower leg, Left upper leg, Right upper leg     Bathing assist Assist Level: Dependent - Patient 0%     Upper Body Dressing/Undressing Upper body dressing Upper body dressing/undressing activity did not occur (including orthotics): Environmental limitations      Upper body assist      Lower Body Dressing/Undressing Lower body dressing      What is the patient wearing?: Incontinence brief     Lower body assist Assist for lower body dressing: 2 Helpers      Toileting Toileting Toileting Activity did not occur (Clothing management and hygiene only): N/A (no void or bm)  Toileting assist       Transfers Chair/bed transfer  Transfers assist  Chair/bed transfer activity did not occur: Safety/medical concerns  Chair/bed transfer assist level: Dependent - mechanical lift     Locomotion Ambulation   Ambulation assist   Ambulation activity did not occur: Safety/medical concerns          Walk 10 feet activity   Assist  Walk 10 feet activity did not occur: Safety/medical concerns        Walk 50 feet activity   Assist Walk 50 feet with 2 turns activity did not occur: Safety/medical concerns         Walk 150 feet activity   Assist Walk 150 feet activity did not occur: Safety/medical concerns         Walk 10 feet on uneven surface  activity   Assist Walk 10 feet on uneven surfaces activity did not occur: Safety/medical concerns         Wheelchair     Assist Is the patient using a wheelchair?: Yes Type of Wheelchair: Manual    Wheelchair assist level: Dependent - Patient 0%      Wheelchair 50 feet with 2 turns activity    Assist        Assist Level: Dependent - Patient 0%   Wheelchair 150 feet activity     Assist      Assist Level: Dependent - Patient 0%   Blood pressure (!) 142/81, pulse 65, temperature (!) 97.4 F (36.3 C), temperature source Oral, resp. rate (!) 22, height 5' 10 (1.778 m), weight 86 kg, SpO2 96%.  Medical Problem List and Plan: 1. 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 Pt was on xarelto  at time of the hemorrhage.              -patient may shower , cover picc line, dressings             -ELOS/Goals: 24-28 days, PT/OT/SLP min A             -Admit to CIR 2.  Antithrombotics: -DVT/anticoagulation:  Pharmaceutical: Lovenox - No plans to resume  Xarelto               -antiplatelet therapy: N/A 3. Pain Management: Tylenol  as needed 4. Mood/Behavior/Sleep: Provide emotional support             -antipsychotic agents: N/A 5. Neuropsych/cognition: This patient is not capable of making decisions on his own behalf. 6. Skin/Wound Care: Routine skin checks 7. Fluids/Electrolytes/Nutrition: Routine in and outs with follow-up chemistries 8.  History of recurrent DVT/PE.  Xarelto  reversed with Kcentra .  As per hematology services Dr.Sherill no immediate needs to resume anticoagulation or place IVC filter.  Hematology note indicates Dr. Cloretta following closely and will make further recommendations 9.  Hypertension.  Coreg  3.125 mg twice daily.  Monitor with increased mobility Vitals:   12/11/24 0405 12/11/24 0808  BP: (!) 143/76 (!) 142/81  Pulse: 88 65  Resp: (!) 24 (!) 22  Temp: 98.2 F (36.8 C) (!) 97.4 F (36.3 C)  SpO2: 95% 96%    10. Dysphagia.  Currently on a full liquid diet after MBS 12/09/2024.  Supplemental tube feeds with Cortrak and follow-up dietary services 11. Leukocytosis resolved. Recheck labs tomorrow, last WBC 12.7 on 1/10    Latest Ref Rng & Units 12/10/2024    9:57 AM 12/05/2024    7:38 PM 12/05/2024   10:24 AM  CBC  WBC 4.0 - 10.5 K/uL 9.9  12.7    Hemoglobin  13.0 - 17.0 g/dL 88.0  84.9  84.6   Hematocrit 39.0 - 52.0 % 35.9  42.6  45.0   Platelets 150 - 400 K/uL 103  162      12. Fevers resolved. WBC mildly elevated but stable Neurology suspecting central fevers. Negative CXR except small effusions and bibasilar atelectasis. U/A was negative on 12/05/24 Elevated RR due to poor posture, neurologic condition which include reduced breath support atelectasis , enc IS discussed with LPN SLP can address as well may need RMT   Hypernatremia-improving 1/16.  Was getting hyptertonic saline on acute, no need for further intervention, will monitor     Latest Ref Rng & Units 12/11/2024    4:28 AM 12/10/2024    9:57 AM  12/09/2024    2:01 PM  BMP  Glucose 70 - 99 mg/dL 884  861    BUN 8 - 23 mg/dL 35  37    Creatinine 9.38 - 1.24 mg/dL 9.14  9.07    Sodium 864 - 145 mmol/L 149  152  158   Potassium 3.5 - 5.1 mmol/L 3.3  3.1    Chloride 98 - 111 mmol/L 114  117    CO2 22 - 32 mmol/L 27  26    Calcium 8.9 - 10.3 mg/dL 7.8  7.9    BUN still up , increase free H20 which should help Na+ K+ improving  need to supplement K+, cont 20meq BID- just started 1/15  LOS: 2 days A FACE TO FACE EVALUATION WAS PERFORMED  Prentice FORBES Compton 12/11/2024, 8:29 AM     "

## 2024-12-11 NOTE — Progress Notes (Signed)
 Occupational Therapy Session Note  Patient Details  Name: Dwayne [name] MRN: [id number] Date of Birth: 03/16/1946  Today's Date: 12/11/2024 OT Individual Time: 9199-9086 OT Individual Time Calculation (min): 73 min    Short Term Goals: Week 1:  OT Short Term Goal 1 (Week 1): Patient will sit at edge of bed with mod assist prior to active transfer OT Short Term Goal 2 (Week 1): Patient will turn head and locate item slightly left of midline with mod cueing, and min physical assist OT Short Term Goal 3 (Week 1): Patient will complete oral care with mod assist while seated in wheelchair OT Short Term Goal 4 (Week 1): Patient will locate left arm on body with min assist while seated in wheelchair  Skilled Therapeutic Interventions/Progress Updates: Patient received resting in bed. Agreeable to to working with occupational therapy. Total assist of two for bed positioning to sit up for self feeding. Patient required Total assist/ hand over hand with assist to scoop each bite and accurately bring the bite to his mouth. Patient with fixed gaze to lower right quadrant. Max verbal cuing to encourage attention to breakfast tray at midline. Attempted use of red build up handle to draw attention to spoon and fork handles and encourage scanning for silverware on the tray. Cues for small bites and sips of water  throughout meal. Patient only eating 15% of breakfast with increased time and encouragement. Followed with oral care. Hand over hand after initial cues for patient to bring tooth brush to mouth. Patient with inattention to left side with verbal and tactile cues. Max assist from therapist to complete oral care. Continued treatment with bed level self care working on patient bathing self and rolling for incontinence  care. Continue with skilled OT POC to improve patient participation with self care skills     Therapy Documentation Precautions:  Precautions Precautions: Fall Precaution/Restrictions  Comments: Left neglect, does not cross midline to left, Pt seems to be opening eyes more. Restrictions Weight Bearing Restrictions Per Provider Order: No General:   Vital Signs: Therapy Vitals Temp: (!) 97.4 F (36.3 C) Temp Source: Oral Pulse Rate: 65 Resp: (!) 22 BP: (!) 142/81 Patient Position (if appropriate): Lying Oxygen Therapy SpO2: 96 % O2 Device: Room Air Pain:0 c/o pain     Therapy/Group: Individual Therapy  Isaiah JONETTA Freund 12/11/2024, 1:13 PM

## 2024-12-11 NOTE — Progress Notes (Signed)
 Patient ID: Dwayne [name], male   DOB: 1946/02/03, 79 y.o.   MRN: [id number] Left message for Dwayne [name] at [facility] to touch base and let know pt will need rehab once discharge from our rehab. Await return call

## 2024-12-11 NOTE — Progress Notes (Signed)
 Speech Language Pathology Daily Session Note  Patient Details  Name: BRAYSON LIVESEY MRN: [id number] Date of Birth: 27-May-1946  Today's Date: 12/11/2024 SLP Individual Time: 9084-8984 SLP Individual Time Calculation (min): 60 min  Short Term Goals: Week 1: SLP Short Term Goal 1 (Week 1): Patient will attend to information presented at midline during functional tasks in 2/5 opportunities with max assist. SLP Short Term Goal 2 (Week 1): Patient will tolerate trials of Dys2+ solids with max cues for awareness and swallow safety. SLP Short Term Goal 3 (Week 1): Patient will sustain attention for 1-3 minutes during functional tasks with total assist. SLP Short Term Goal 4 (Week 1): Patient will follow 1 step directions in 80% of opportunities with max assist. SLP Short Term Goal 5 (Week 1): Patient will state 2 cognitive deficits given max assist.  Skilled Therapeutic Interventions: SLP conducted skilled therapy session targeting swallowing and cognition goals. Patient asleep upon SLP entry but awoke with mod multimodal cues. He benefited from continuous cues to attend to therapy items throughout for any length of time, though was much less tangential this session compared to evaluation. Patient consumed regular solids and thin liquids, dependent to total assist to locate items placed at midline due to severe L neglect. With regular solids, patient exhibited L buccal pocketing requiring total cues for awareness and clearance via liquid wash. SLP conducted various tasks targeting attention to the L side including card sorting (total assist) and basic math equations placed on L side (dependent for L attention, min assist for accurate equation answer generation.) Patient was left in room with call bell in reach and alarm set. SLP will continue to target goals per plan of care.        Pain Pain Assessment Pain Scale: Faces Faces Pain Scale: No hurt  Therapy/Group: Individual Therapy  Jodie Leiner,  M.A., CCC-SLP  Arseniy Toomey A Ynez Eugenio 12/11/2024, 11:28 AM

## 2024-12-11 NOTE — Progress Notes (Signed)
 Nutrition Follow Up  DOCUMENTATION CODES:   Not applicable  INTERVENTION:  Continue tube feeding via Cortrak: transition to nocturnal feeds to assess daytime PO intake and ability to discontinue Cortrak Osmolite 1.5 at 60 ml/h x 12 h/day (1800-0600) (720 ml per day) Prosource TF20 60 ml BID Provides 1240 kcal (meets 68% calorie needs), 85 gm protein (meets 94% protein needs), 548 ml free water  daily FWF 100 ml q6h (total free water  948 ml daily) May need to be increased to 200 ml q6h if hypernatremia does not improve  Monitor daytime PO intake to assess if tube feeds can be discontinued Begin calorie count through weekend Continue full feeding assistance to optimize oral intake  NUTRITION DIAGNOSIS:   Swallowing difficulty related to dysphagia as evidenced by other (comment), per patient/family report (diagnosis). Remains applicable  GOAL:   Patient will meet greater than or equal to 90% of their needs Progressing  MONITOR:   PO intake, Diet advancement, TF tolerance  REASON FOR ASSESSMENT:   Consult Enteral/tube feeding initiation and management  ASSESSMENT:   Pt with hx of HTN, DVT, and PE. Recent admission 1/10-1/14 with poor PO intake and multiple falls, diagnosed ICH. 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 Recent MBS advanced diet to FLD. Admitted to CIR for comprehensive rehab.  1/14 MBS advanced to FLD 1/15 SLP assessment advanced to DYS 1 thins w/ full feeding assistance  Pt resting in bed at time of assessment. Pt responded and woke up to RD interaction. Pt still seemed confused and not able to answer all questions about appetite. OT worked with pt at breakfast time and reports pt only ate 15% of breakfast even w/ full assist and persistent encouragement/cues.  RD returned in the afternoon while wife was at bedside. Wife reports pt ate well for lunch, ate about 50% of each item on tray. Wife provided diet hx and stated  that pt ate very well at [facility] (ate 3 meals per day) and he liked the food there. States he recently noticed some weight gain PTA. Wife hopeful that pt's intake will continue to improve.  Nutrition focused physical exam showed mild muscle depletions likely related to muscle atrophy over time. Pt's wife states pt is still fairly active and continues to work out in gym PTA.  Discussed transition to nocturnal feeds with nursing. Initiating calorie count through weekend to assess if PO intake has any improvements. Discussed documentation with nursing.   Average Meal Completion: 1/15-1/16: 30% average intake x 3 recorded meals   Nutritionally Relevant Medications: MVI w/ minerals Potassium chloride  20 mEq BID daily Senna Thiamine  100 mg  Labs reviewed: Sodium 149<--152 Potassium 3.3<--3.1 Chloride 114<--117 No recent CBG BUN 35<--37 A1c 5.6  Admit weight: 86 kg  NUTRITION - FOCUSED PHYSICAL EXAM:  Flowsheet Row Most Recent Value  Orbital Region No depletion  Upper Arm Region No depletion  Thoracic and Lumbar Region No depletion  Buccal Region Mild depletion  Temple Region Moderate depletion  Clavicle Bone Region Mild depletion  Clavicle and Acromion Bone Region Mild depletion  Scapular Bone Region Mild depletion  Dorsal Hand No depletion  Patellar Region Mild depletion  Anterior Thigh Region Mild depletion  Posterior Calf Region Mild depletion  Edema (RD Assessment) Mild  Hair Reviewed  Eyes Reviewed  Mouth Reviewed  Skin Reviewed  Nails Reviewed     Diet Order:   Diet Order             DIET - DYS 1  Fluid consistency: Thin  Diet effective now                   EDUCATION NEEDS:   Not appropriate for education at this time  Skin:  Skin Assessment: Reviewed RN Assessment  Last BM:  1/14  Height:   Ht Readings from Last 1 Encounters:  12/09/24 5' 10 (1.778 m)    Weight:   Wt Readings from Last 1 Encounters:  12/09/24 86 kg    Ideal Body  Weight:  75.45 kg  BMI:  Body mass index is 27.2 kg/m.  Estimated Nutritional Needs:   Kcal:  1800-2000  Protein:  90-115g  Fluid:  1.8-2L    Josette Glance, MS, RDN, LDN Clinical Dietitian I Please reach out via secure chat

## 2024-12-12 ENCOUNTER — Inpatient Hospital Stay (HOSPITAL_COMMUNITY)
Admission: EM | Admit: 2024-12-12 | Discharge: 2024-12-27 | DRG: 102 | Disposition: E | Source: Other Acute Inpatient Hospital | Attending: Internal Medicine | Admitting: Internal Medicine

## 2024-12-12 ENCOUNTER — Encounter (HOSPITAL_COMMUNITY): Payer: Self-pay

## 2024-12-12 ENCOUNTER — Inpatient Hospital Stay (HOSPITAL_COMMUNITY)

## 2024-12-12 DIAGNOSIS — Z515 Encounter for palliative care: Secondary | ICD-10-CM

## 2024-12-12 DIAGNOSIS — D649 Anemia, unspecified: Secondary | ICD-10-CM | POA: Diagnosis present

## 2024-12-12 DIAGNOSIS — S0634AD Traumatic hemorrhage of right cerebrum with loss of consciousness status unknown, subsequent encounter: Secondary | ICD-10-CM

## 2024-12-12 DIAGNOSIS — F419 Anxiety disorder, unspecified: Secondary | ICD-10-CM | POA: Diagnosis present

## 2024-12-12 DIAGNOSIS — Z9189 Other specified personal risk factors, not elsewhere classified: Secondary | ICD-10-CM

## 2024-12-12 DIAGNOSIS — D6959 Other secondary thrombocytopenia: Secondary | ICD-10-CM | POA: Diagnosis present

## 2024-12-12 DIAGNOSIS — I69154 Hemiplegia and hemiparesis following nontraumatic intracerebral hemorrhage affecting left non-dominant side: Secondary | ICD-10-CM

## 2024-12-12 DIAGNOSIS — G919 Hydrocephalus, unspecified: Secondary | ICD-10-CM | POA: Diagnosis present

## 2024-12-12 DIAGNOSIS — G935 Compression of brain: Secondary | ICD-10-CM | POA: Diagnosis present

## 2024-12-12 DIAGNOSIS — W1830XD Fall on same level, unspecified, subsequent encounter: Secondary | ICD-10-CM

## 2024-12-12 DIAGNOSIS — Z86718 Personal history of other venous thrombosis and embolism: Secondary | ICD-10-CM

## 2024-12-12 DIAGNOSIS — Z823 Family history of stroke: Secondary | ICD-10-CM

## 2024-12-12 DIAGNOSIS — G936 Cerebral edema: Secondary | ICD-10-CM | POA: Diagnosis present

## 2024-12-12 DIAGNOSIS — S065X9S Traumatic subdural hemorrhage with loss of consciousness of unspecified duration, sequela: Secondary | ICD-10-CM | POA: Diagnosis not present

## 2024-12-12 DIAGNOSIS — I10 Essential (primary) hypertension: Secondary | ICD-10-CM | POA: Diagnosis present

## 2024-12-12 DIAGNOSIS — J9601 Acute respiratory failure with hypoxia: Secondary | ICD-10-CM | POA: Diagnosis present

## 2024-12-12 DIAGNOSIS — R1310 Dysphagia, unspecified: Secondary | ICD-10-CM

## 2024-12-12 DIAGNOSIS — I611 Nontraumatic intracerebral hemorrhage in hemisphere, cortical: Secondary | ICD-10-CM

## 2024-12-12 DIAGNOSIS — Z7901 Long term (current) use of anticoagulants: Secondary | ICD-10-CM

## 2024-12-12 DIAGNOSIS — I69191 Dysphagia following nontraumatic intracerebral hemorrhage: Secondary | ICD-10-CM

## 2024-12-12 DIAGNOSIS — Z79899 Other long term (current) drug therapy: Secondary | ICD-10-CM

## 2024-12-12 DIAGNOSIS — F32A Depression, unspecified: Secondary | ICD-10-CM | POA: Diagnosis present

## 2024-12-12 DIAGNOSIS — I619 Nontraumatic intracerebral hemorrhage, unspecified: Secondary | ICD-10-CM | POA: Diagnosis present

## 2024-12-12 DIAGNOSIS — Z66 Do not resuscitate: Secondary | ICD-10-CM | POA: Diagnosis present

## 2024-12-12 DIAGNOSIS — Z833 Family history of diabetes mellitus: Secondary | ICD-10-CM

## 2024-12-12 DIAGNOSIS — G932 Benign intracranial hypertension: Principal | ICD-10-CM | POA: Diagnosis present

## 2024-12-12 DIAGNOSIS — Z86711 Personal history of pulmonary embolism: Secondary | ICD-10-CM

## 2024-12-12 DIAGNOSIS — E870 Hyperosmolality and hypernatremia: Secondary | ICD-10-CM | POA: Diagnosis present

## 2024-12-12 DIAGNOSIS — R300 Dysuria: Secondary | ICD-10-CM | POA: Diagnosis not present

## 2024-12-12 LAB — CBC WITH DIFFERENTIAL/PLATELET
Abs Immature Granulocytes: 0.05 K/uL (ref 0.00–0.07)
Basophils Absolute: 0 K/uL (ref 0.0–0.1)
Basophils Relative: 0 %
Eosinophils Absolute: 0.3 K/uL (ref 0.0–0.5)
Eosinophils Relative: 3 %
HCT: 35.2 % — ABNORMAL LOW (ref 39.0–52.0)
Hemoglobin: 11.7 g/dL — ABNORMAL LOW (ref 13.0–17.0)
Immature Granulocytes: 1 %
Lymphocytes Relative: 27 %
Lymphs Abs: 2.7 K/uL (ref 0.7–4.0)
MCH: 29.3 pg (ref 26.0–34.0)
MCHC: 33.2 g/dL (ref 30.0–36.0)
MCV: 88 fL (ref 80.0–100.0)
Monocytes Absolute: 0.8 K/uL (ref 0.1–1.0)
Monocytes Relative: 8 %
Neutro Abs: 6.1 K/uL (ref 1.7–7.7)
Neutrophils Relative %: 61 %
Platelets: 116 K/uL — ABNORMAL LOW (ref 150–400)
RBC: 4 MIL/uL — ABNORMAL LOW (ref 4.22–5.81)
RDW: 13.3 % (ref 11.5–15.5)
WBC: 9.9 K/uL (ref 4.0–10.5)
nRBC: 0 % (ref 0.0–0.2)

## 2024-12-12 LAB — AMMONIA: Ammonia: 25 umol/L (ref 9–35)

## 2024-12-12 LAB — SODIUM
Sodium: 143 mmol/L (ref 135–145)
Sodium: 146 mmol/L — ABNORMAL HIGH (ref 135–145)

## 2024-12-12 LAB — URINALYSIS, W/ REFLEX TO CULTURE (INFECTION SUSPECTED)
Bilirubin Urine: NEGATIVE
Glucose, UA: NEGATIVE mg/dL
Hgb urine dipstick: NEGATIVE
Ketones, ur: NEGATIVE mg/dL
Leukocytes,Ua: NEGATIVE
Nitrite: NEGATIVE
Protein, ur: 30 mg/dL — AB
Specific Gravity, Urine: 1.02 (ref 1.005–1.030)
pH: 7 (ref 5.0–8.0)

## 2024-12-12 LAB — COMPREHENSIVE METABOLIC PANEL WITH GFR
ALT: 29 U/L (ref 0–44)
AST: 35 U/L (ref 15–41)
Albumin: 3 g/dL — ABNORMAL LOW (ref 3.5–5.0)
Alkaline Phosphatase: 47 U/L (ref 38–126)
Anion gap: 9 (ref 5–15)
BUN: 31 mg/dL — ABNORMAL HIGH (ref 8–23)
CO2: 25 mmol/L (ref 22–32)
Calcium: 8.2 mg/dL — ABNORMAL LOW (ref 8.9–10.3)
Chloride: 107 mmol/L (ref 98–111)
Creatinine, Ser: 0.75 mg/dL (ref 0.61–1.24)
GFR, Estimated: >60 mL/min
Glucose, Bld: 102 mg/dL — ABNORMAL HIGH (ref 70–99)
Potassium: 4 mmol/L (ref 3.5–5.1)
Sodium: 141 mmol/L (ref 135–145)
Total Bilirubin: 0.6 mg/dL (ref 0.0–1.2)
Total Protein: 5.4 g/dL — ABNORMAL LOW (ref 6.5–8.1)

## 2024-12-12 LAB — GLUCOSE, CAPILLARY: Glucose-Capillary: 101 mg/dL — ABNORMAL HIGH (ref 70–99)

## 2024-12-12 MED ORDER — ADULT MULTIVITAMIN W/MINERALS CH: 1.0000 | ORAL_TABLET | Freq: Every day | ORAL | Status: DC

## 2024-12-12 MED ORDER — PROSOURCE TF20 ENFIT COMPATIBL EN LIQD
60.0000 mL | Freq: Every day | ENTERAL | Status: DC
  Administered 2024-12-13 – 2024-12-14 (×2): 60 mL
  Filled 2024-12-12 (×2): qty 60

## 2024-12-12 MED ORDER — CARVEDILOL 3.125 MG PO TABS
3.1250 mg | ORAL_TABLET | Freq: Two times a day (BID) | ORAL | Status: DC
  Administered 2024-12-12 – 2024-12-16 (×8): 3.125 mg
  Filled 2024-12-12 (×8): qty 1

## 2024-12-12 MED ORDER — ACETAMINOPHEN 325 MG PO TABS
650.0000 mg | ORAL_TABLET | Freq: Four times a day (QID) | ORAL | Status: DC | PRN
  Administered 2024-12-12 – 2024-12-15 (×5): 650 mg
  Filled 2024-12-12 (×6): qty 2

## 2024-12-12 MED ORDER — OSMOLITE 1.5 CAL PO LIQD
1000.0000 mL | ORAL | Status: DC
  Administered 2024-12-12 – 2024-12-14 (×3): 1000 mL
  Filled 2024-12-12: qty 1000

## 2024-12-12 MED ORDER — LABETALOL HCL 5 MG/ML IV SOLN
10.0000 mg | INTRAVENOUS | Status: DC | PRN
  Administered 2024-12-12 – 2024-12-13 (×3): 10 mg via INTRAVENOUS
  Filled 2024-12-12 (×4): qty 4

## 2024-12-12 MED ORDER — PROSOURCE TF20 ENFIT COMPATIBL EN LIQD: 60.0000 mL | Freq: Every day | ENTERAL | Status: DC

## 2024-12-12 MED ORDER — POLYETHYLENE GLYCOL 3350 17 G PO PACK
17.0000 g | PACK | Freq: Every day | ORAL | Status: DC
  Filled 2024-12-12: qty 1

## 2024-12-12 MED ORDER — ADULT MULTIVITAMIN W/MINERALS CH
1.0000 | ORAL_TABLET | Freq: Every day | ORAL | Status: DC
  Administered 2024-12-13 – 2024-12-16 (×4): 1
  Filled 2024-12-12 (×4): qty 1

## 2024-12-12 MED ORDER — ENOXAPARIN SODIUM 40 MG/0.4ML IJ SOSY: 40.0000 mg | PREFILLED_SYRINGE | INTRAMUSCULAR | Status: DC

## 2024-12-12 MED ORDER — CARVEDILOL 3.125 MG PO TABS: 3.1250 mg | ORAL_TABLET | Freq: Two times a day (BID) | ORAL | Status: DC

## 2024-12-12 MED ORDER — SODIUM CHLORIDE 3 % IV SOLN
INTRAVENOUS | Status: DC
  Filled 2024-12-12 (×8): qty 500

## 2024-12-12 MED ORDER — CHLORHEXIDINE GLUCONATE CLOTH 2 % EX PADS
6.0000 | MEDICATED_PAD | Freq: Every day | CUTANEOUS | Status: DC
  Administered 2024-12-12 – 2024-12-16 (×5): 6 via TOPICAL

## 2024-12-12 MED ORDER — SENNOSIDES-DOCUSATE SODIUM 8.6-50 MG PO TABS
1.0000 | ORAL_TABLET | Freq: Two times a day (BID) | ORAL | Status: DC
  Administered 2024-12-13: 1
  Filled 2024-12-12 (×3): qty 1

## 2024-12-12 MED ORDER — LIDOCAINE HCL URETHRAL/MUCOSAL 2 % EX GEL: 1.0000 | CUTANEOUS | Status: DC | PRN

## 2024-12-12 MED ORDER — SODIUM CHLORIDE 23.4 % INJECTION (4 MEQ/ML) FOR IV ADMINISTRATION
120.0000 meq | Freq: Once | INTRAVENOUS | Status: AC
  Administered 2024-12-12: 120 meq via INTRAVENOUS
  Filled 2024-12-12: qty 30

## 2024-12-12 NOTE — Progress Notes (Addendum)
 Urine specimen collected per MD order and sent to lab. Blood work obtained from IV team. Dr. Emeline Notified of CT results and Chest x-ray results.   Geni Armor, LPN

## 2024-12-12 NOTE — Progress Notes (Incomplete)
 Patient had an increase in lethary and change in mental status, Notified Dr. Emeline New orders in place, Charge RN aware, and Rapid Response Nurse contacted.   Geni Armor, LPN

## 2024-12-12 NOTE — Progress Notes (Signed)
 Occupational Therapy Discharge Summary  Patient Details  Name: Dwayne [name] MRN: [id number] Date of Birth: 03/22/1946  Occupational Therapy Discharge Note  This patient was unable to complete the inpatient rehab program due to acute care transfer following CT scan this date; therefore did not meet their long term goals. Pt left the program at a Max+2 to Total assist level for their  functional ADLs. This patient is being discharged from OT services at this time.  BIMS at time of d/c  Pt unable to complete due to medical status  See CareTool for functional status details.  If the patient is able to return to inpatient rehabilitation within 3 midnights, this may be considered an interrupted stay and therapy services will resume as ordered. Modification and reinstatement of their goals will be made upon completion of therapy service reevaluations.   Roarke Marciano Woods-Chance, MS, OTR/L 12/12/2024, 3:52 PM

## 2024-12-12 NOTE — Progress Notes (Signed)
 Physical Therapy Session Note  Patient Details  Name: Dwayne [name] MRN: [id number] Date of Birth: Oct 08, 1946  Today's Date: 12/12/2024 PT Individual Time: 0918-0950 PT Individual Time Calculation (min): 32 min  and Today's Date: 12/12/2024 PT Missed Time: 28 Minutes Missed Time Reason: Patient fatigue  Short Term Goals: Week 1:  PT Short Term Goal 1 (Week 1): Pt will roll side to side w/ max A PT Short Term Goal 2 (Week 1): Pt will transfer sup to sit w/ mod A consistently w/ bed features. PT Short Term Goal 3 (Week 1): Pt will transfer sit to stand to RW w/ max A  Skilled Therapeutic Interventions/Progress Updates:  Patient supine in bed on entrance to room. Patient asleep and difficult to rouse. Gentle touching to arms, legs, feet, forehead, then chest with no move to open eyes. Acknowledges therapist with eyes closed and minimal verbalizations.   +2 available and so decision made to assist pt into seated position to determine if transition change will improve pt's alertness.   Patient with no pain complaint or appearance of pain at start of session.  Therapeutic Activity: Bed Mobility: Pt performed supine > sit with TotA +2. VC/ tc provided for sequencing and all required movements. Once reaching upright seate position, pt provided with consistent vc for hand placement far out to R side in order to reduce push to L side. Pt requires consistent block to his L side and intermittent block to back to prevent LOB. Pt demonstrating slight improvement in ability to open eyes but continues to demo sluggishness and reduced voluntary movement with R hemibody.  Transfers: Pt performed sit<>stand transfers throughout session using STEDY with MaxA+2. Initially, pt is able to follow instructions to grasp bar on STEDY and to initiate rise to stand. Continues push to L side. Move toward TIS w/c in order to assess pt's trunk stability with back support, however pt unable to rise to stand and unable to  maintain hand position on STEDY bar. Decision made to return to bed d/t pt's inability to improve alertness with change in position.   Pt then able to follow instruction to initiate final rise to stand in STEDY from perch seat requiring MaxA +2 to complete. Then able to lift RLE to place on bed with MaxA +2 for assist to return to supine. Provided vc/ tc throughout for all initiation of mobility.  Patient supine in bed at end of session with brakes locked, bed alarm set, and all needs within reach.  Pt missed of skilled therapy due to fatigue and inability to fully rouse. Will re-attempt as schedule and pt availability permits.  Notified NT of pt's disposition and inability to fully rouse throughout session.   Therapy Documentation Precautions:  Precautions Precautions: Fall Precaution/Restrictions Comments: Left neglect, does not cross midline to left, Pt seems to be opening eyes more. Restrictions Weight Bearing Restrictions Per Provider Order: No  Pain:  No pain indicated this session, but pt not fully roused throughout.    Therapy/Group: Individual Therapy  Mliss DELENA Milliner PT, DPT, CSRS 12/12/2024, 7:54 AM

## 2024-12-12 NOTE — Progress Notes (Signed)
 Notified Dr. Emeline of patient's heels being pink and blanchable. Pictures taken. Heel dressings applied followed by foam dressing.   Geni Armor, LPN

## 2024-12-12 NOTE — Progress Notes (Signed)
 Occupational Therapy Note  Patient Details  Name: Dwayne [name] MRN: [id number] Date of Birth: 11/07/1946  Today's Date: 12/12/2024 OT Missed Time: 45 Minutes Missed Time Reason: Patient fatigue (increased lethargy)  Pt missed 45 mins skilled OT services 2/2 increased lethargy and unable to actively participate in therapy. Pt returned from CT immediately prior to scheduled therapy. Pt unable to open eyes or respond to simple commands. Will attempt to see again as schedules allow and pt is able to participate.    Maritza Ned [facility] 12/12/2024, 2:01 PM

## 2024-12-12 NOTE — Progress Notes (Addendum)
 Patient had an increase in lethargy/ change in mental status, Notified Dr. Emeline, Charge RN, & Rapid Response Nurse was contacted. New orders placed.   Geni Armor, LPN

## 2024-12-12 NOTE — Progress Notes (Addendum)
 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 Now with lethargy and CT showing severe peri-lesional edema causing significantly worse midline shift and hydrocephalus  LOS: 3 days    Plan: See subjective. No surgery planned.  Hypertonic saline with goal Na 150-155 SBP<160 Diet and activity as tolerated from my standpoint Ok to continue DVT chemoppx   Subjective: I had a long discussion with the family regarding hemicraniectomy vs medical mgmt. They very clearly stated that the patient previously indicated that he would not want an invasive measure such as hemicraniectomy. She also states that he would not want to be intubated. I appreciated the fact that the patient and his wife had already laid out advanced directives for such severe medical situations  Objective: Vital signs in last 24 hours: Temp:  [98.7 F (37.1 C)-99.6 F (37.6 C)] 99.5 F (37.5 C) (01/17 1200) Pulse Rate:  [56-98] 65 (01/17 1200) Resp:  [19-28] 19 (01/17 1200) BP: (143-156)/(65-76) 145/70 (01/17 1200) SpO2:  [95 %-99 %] 95 % (01/17 1200)  Intake/Output from previous day: 01/16 0701 - 01/17 0700 In: 640 [P.O.:240; NG/GT:400] Out: -  Intake/Output this shift: Total I/O In: 100 [NG/GT:100] Out: 700 [Urine:700]  Exam: GCS 2E 3V 59M PERRL Says a few words LUE and LLE plegia RUE hand grip to command RLE wiggles toes to command  Lab Results: Recent Labs    12/10/24 0957 12/12/24 1210  WBC 9.9 9.9  HGB 11.9* 11.7*  HCT 35.9* 35.2*  PLT 103* 116*   BMET Recent Labs    12/11/24 0428 12/12/24 1210  NA 149* 141  K 3.3* 4.0  CL 114* 107  CO2 27 25  GLUCOSE 115* 102*  BUN 35* 31*  CREATININE 0.85 0.75  CALCIUM 7.8* 8.2*       Dwayne [name] R Dwayne [name] 12/12/2024, 3:09 PM

## 2024-12-12 NOTE — Progress Notes (Signed)
 "                                                        PROGRESS NOTE   Subjective/Complaints: Patient noted to have ongoing tachypnea overnight, otherwise vital stable; mildly elevated temps but no fevers. On a.m. exam, patient is working with OT, wife notes he is significantly more lethargic than when she is seen in prior days.  He is not verbalizing or answering questions initially, can follow some intermittent commands with his right upper extremity and is moving his right lower extremity spontaneously.  After significant stimulation, can open his eyes a little bit but remains disoriented.  Unsure when last known normal, per documentation seems he was at least intermittently confused over the last few days, and unfortunately being the weekend staff is new and unfamiliar with him.  Rapid response called, lab workup benign, CT head significant for increased midline shift and left lateral ventricle dilation.  Spoke with on-call neurosurgery Dr. Darnella, who feels patient needs to be readmitted to the neuro ICU for hypertonic saline/mannitol/possible surgical intervention.  Discussed the workup and findings with the patient's family at bedside, who are appreciative.   Objective:   DG CHEST PORT 1 VIEW Result Date: 12/12/2024 EXAM: 1 VIEW XRAY OF THE CHEST 12/12/2024 09:11:00 AM COMPARISON: 12/09/2024 CLINICAL HISTORY: Tachypnea. FINDINGS: LINES, TUBES AND DEVICES: Right PICC terminates over the cavoatrial junction. Enteric tube enters stomach with tip not seen. LUNGS AND PLEURA: Small bilateral pleural effusions, unchanged. Hazy bibasilar lung opacities, unchanged. Mild cephalization of pulmonary vasculature without overt pulmonary edema. No pneumothorax. HEART AND MEDIASTINUM: Stable cardiomediastinal silhouette with mild cardiomegaly. BONES AND SOFT TISSUES: No acute osseous abnormality. IMPRESSION: 1. Small bilateral pleural effusions, unchanged. 2. Mild cephalization of pulmonary vasculature without  overt pulmonary edema. 3. Hazy bibasilar lung opacities, unchanged. 4. Stable cardiomediastinal silhouette with mild cardiomegaly. Electronically signed by: Lonni Necessary MD 12/12/2024 01:08 PM EST RP Workstation: [id number]   CT HEAD WO CONTRAST ( ) Result Date: 12/12/2024 EXAM: CT HEAD WITHOUT CONTRAST 12/12/2024 12:38:20 PM TECHNIQUE: CT of the head was performed without the administration of intravenous contrast. Automated exposure control, iterative reconstruction, and/or weight based adjustment of the mA/kV was utilized to reduce the radiation dose to as low as reasonably achievable. COMPARISON: 12/06/2024 CLINICAL HISTORY: Mental status change, unknown cause; S/p SDH, increased lethargy. FINDINGS: BRAIN AND VENTRICLES: Persistent right temporal lobe hematoma with surrounding vasogenic edema. Increased right-to-left midline shift, measuring up to 1.6 cm from previous 1.1 cm. Increased right lateral ventricle effacement. Increased left lateral ventricle dilation. No evidence of acute infarct. No extra-axial collection. ORBITS: No acute abnormality. SINUSES: No acute abnormality. SOFT TISSUES AND SKULL: No acute soft tissue abnormality. No skull fracture. IMPRESSION: 1. Persistent right temporal lobe hematoma with surrounding vasogenic edema. 2. Increased right-to-left midline shift, measuring up to 1.6 cm, previously 1.1 cm. 3. Increased right lateral ventricle effacement and increased left lateral ventricle dilation. Electronically signed by: Lonni Necessary MD 12/12/2024 12:53 PM EST RP Workstation: [id number]   Recent Labs    12/10/24 0957 12/12/24 1210  WBC 9.9 9.9  HGB 11.9* 11.7*  HCT 35.9* 35.2*  PLT 103* 116*   Recent Labs    12/11/24 0428 12/12/24 1210  NA 149* 141  K 3.3* 4.0  CL 114* 107  CO2 27 25  GLUCOSE 115* 102*  BUN 35* 31*  CREATININE 0.85 0.75  CALCIUM 7.8* 8.2*    Intake/Output Summary (Last 24 hours) at 12/12/2024 1507 Last data filed at 12/12/2024  1324 Gross per 24 hour  Intake 400 ml  Output 700 ml  Net -300 ml        Physical Exam: Vital Signs Blood pressure (!) 145/70, pulse 65, temperature 99.5 F (37.5 C), temperature source Oral, resp. rate 19, height 5' 10 (1.778 m), weight 86 kg, SpO2 95%. General: Laying in bed, lethargic HEENT bruising left periorbital including eyelid; does not open eyes spontaneously.  Pupils approximately equal. Heart: Regular rate and rhythm no rubs murmurs or extra sounds Lungs: Clear to auscultation, breathing unlabored, no rales or wheezes.  Intermittent tachypnea breathing. Abdomen: Positive bowel sounds, soft nontender to palpation, nondistended.  Core track in place. Extremities: No clubbing, cyanosis, or edema Skin: No evidence of breakdown, pink maculopapular rash over back consistent with heat rash/miliaria  Bilateral pink, soft heels.  Blanchable.  neurologic:  Very lethargic, does not respond to sternal rub, with repeated stimuli will attempt some verbalizations but remains disoriented to self, place, and time. Can follow commands to grip and extend his right hand.  Is moving right upper and lower extremity spontaneously.  Remains with dense hemineglect/left hemiparesis. Uncooperative with cranial nerve exam. Reflexes reduced in left upper and lower extremity, intact in right upper and lower extremity.  Negative bilateral Babinski.  No apparent tone.  Assessment/Plan: 1. Functional deficits which require 3+ hours per day of interdisciplinary therapy in a comprehensive inpatient rehab setting. Physiatrist is providing close team supervision and 24 hour management of active medical problems listed below. Physiatrist and rehab team continue to assess barriers to discharge/monitor patient progress toward functional and medical goals  Care Tool:  Bathing        Body parts bathed by helper: Right arm, Left arm, Chest, Abdomen, Front perineal area, Buttocks, Face, Left lower leg, Right  lower leg, Left upper leg, Right upper leg     Bathing assist Assist Level: Dependent - Patient 0%     Upper Body Dressing/Undressing Upper body dressing Upper body dressing/undressing activity did not occur (including orthotics): Environmental limitations      Upper body assist      Lower Body Dressing/Undressing Lower body dressing      What is the patient wearing?: Incontinence brief     Lower body assist Assist for lower body dressing: 2 Helpers     Toileting Toileting Toileting Activity did not occur (Clothing management and hygiene only): N/A (no void or bm)  Toileting assist Assist for toileting: Total Assistance - Patient < 25%     Transfers Chair/bed transfer  Transfers assist  Chair/bed transfer activity did not occur: Safety/medical concerns  Chair/bed transfer assist level: Dependent - mechanical lift     Locomotion Ambulation   Ambulation assist   Ambulation activity did not occur: Safety/medical concerns          Walk 10 feet activity   Assist  Walk 10 feet activity did not occur: Safety/medical concerns        Walk 50 feet activity   Assist Walk 50 feet with 2 turns activity did not occur: Safety/medical concerns         Walk 150 feet activity   Assist Walk 150 feet activity did not occur: Safety/medical concerns         Walk 10 feet on uneven surface  activity   Assist Walk  10 feet on uneven surfaces activity did not occur: Safety/medical concerns         Wheelchair     Assist Is the patient using a wheelchair?: Yes Type of Wheelchair: Manual    Wheelchair assist level: Dependent - Patient 0%      Wheelchair 50 feet with 2 turns activity    Assist        Assist Level: Dependent - Patient 0%   Wheelchair 150 feet activity     Assist      Assist Level: Dependent - Patient 0%   Blood pressure (!) 145/70, pulse 65, temperature 99.5 F (37.5 C), temperature source Oral, resp. rate 19,  height 5' 10 (1.778 m), weight 86 kg, SpO2 95%.  Medical Problem List and Plan: 1. 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 Pt was on xarelto  at time of the hemorrhage.              -patient may shower , cover picc line, dressings             -ELOS/Goals: 24-28 days, PT/OT/SLP min A--patient discharging to acute as below             - 1-17: Increased lethargy today, chest x-ray/UA/CMP/CBC unrevealing.  CT head with increased midline shift and right lateral ventricle effacement, left lateral ventricle dilation.  Spoke with on-call neurosurgery Dr. Darnella who feels patient needs transferred back to the neuro ICU for hypertonic saline, mannitol, and surgical intervention.  He is coming to bedside to speak with family.  Spoke with critical care medicine, who will assist in admissions process. - Patient's wife and daughter at bedside, explained situation to them, they are agreeable to transfer  2.  Antithrombotics: -DVT/anticoagulation:  Pharmaceutical: Lovenox - No plans to resume Xarelto               -antiplatelet therapy: N/A  3. Pain Management: Tylenol  as needed 4. Mood/Behavior/Sleep: Provide emotional support             -antipsychotic agents: N/A 5. Neuropsych/cognition: This patient is not capable of making decisions on his own behalf. 6. Skin/Wound Care: Routine skin checks 7. Fluids/Electrolytes/Nutrition: Routine in and outs with follow-up chemistries  - CMP stable 1-17 8.  History of recurrent DVT/PE.  Xarelto  reversed with Kcentra .  As per hematology services Dr.Sherill no immediate needs to resume anticoagulation or place IVC filter.  Hematology note indicates Dr. Cloretta following closely and will make further recommendations 9.  Hypertension.  Coreg  3.125 mg twice daily.  Monitor with increased mobility Vitals:   12/12/24 0755 12/12/24 1200  BP: (!) 148/76 (!) 145/70   Pulse: 98 65  Resp: (!) 25 19  Temp: 99.6 F (37.6 C) 99.5 F (37.5 C)  SpO2: 96% 95%   - Slightly hypertensive, overall stable  10. Dysphagia.  Currently on a full liquid diet after MBS 12/09/2024.  Supplemental tube feeds with Cortrak and follow-up dietary services 11. Leukocytosis resolved. Recheck labs tomorrow, last WBC 12.7 on 1/10    Latest Ref Rng & Units 12/12/2024   12:10 PM 12/10/2024    9:57 AM 12/05/2024    7:38 PM  CBC  WBC 4.0 - 10.5 K/uL 9.9  9.9  12.7   Hemoglobin 13.0 - 17.0 g/dL 88.2  88.0  84.9   Hematocrit 39.0 - 52.0 % 35.2  35.9  42.6   Platelets 150 - 400 K/uL 116  103  162     -  WBC 9.91-17; 12. Fevers resolved. WBC mildly elevated but stable Neurology suspecting central fevers. Negative CXR except small effusions and bibasilar atelectasis. U/A was negative on 12/05/24 Elevated RR due to poor posture, neurologic condition which include reduced breath support atelectasis , enc IS discussed with LPN SLP can address as well may need RMT -1-17: Some ongoing tachypnea and elevated temps, although no fevers.  Infectious workup unrevealing.  Likely neurogenic as above.   Hypernatremia-improving 1/16.  Was getting hyptertonic saline on acute, no need for further intervention, will monitor     Latest Ref Rng & Units 12/12/2024   12:10 PM 12/11/2024    4:28 AM 12/10/2024    9:57 AM  BMP  Glucose 70 - 99 mg/dL 897  884  861   BUN 8 - 23 mg/dL 31  35  37   Creatinine 0.61 - 1.24 mg/dL 9.24  9.14  9.07   Sodium 135 - 145 mmol/L 141  149  152   Potassium 3.5 - 5.1 mmol/L 4.0  3.3  3.1   Chloride 98 - 111 mmol/L 107  114  117   CO2 22 - 32 mmol/L 25  27  26    Calcium 8.9 - 10.3 mg/dL 8.2  7.8  7.9   BUN still up , increase free H20 which should help Na+ K+ improving  need to supplement K+, cont 20meq BID- just started 1/15 - 1-17: Sodium 141, potassium normalized.  BUN/creatinine improving.  LOS: 3 days A FACE TO FACE EVALUATION WAS PERFORMED  Dwayne [name] 12/12/2024, 3:07 PM     "

## 2024-12-12 NOTE — Plan of Care (Signed)
 Pt unable to complete CIR program d/t transfer to acute following new medical status, long term goals discontinued.  Problem: RH Balance Goal: LTG: Patient will maintain dynamic sitting balance (OT) Description: LTG:  Patient will maintain dynamic sitting balance with assistance during activities of daily living (OT) Outcome: Not Met (add Reason) Goal: LTG Patient will maintain dynamic standing with ADLs (OT) Description: LTG:  Patient will maintain dynamic standing balance with assist during activities of daily living (OT)  Outcome: Not Met (add Reason)   Problem: Sit to Stand Goal: LTG:  Patient will perform sit to stand in prep for activites of daily living with assistance level (OT) Description: LTG:  Patient will perform sit to stand in prep for activites of daily living with assistance level (OT) Outcome: Not Met (add Reason)   Problem: RH Eating Goal: LTG Patient will perform eating w/assist, cues/equip (OT) Description: LTG: Patient will perform eating with assist, with/without cues using equipment (OT) Outcome: Not Met (add Reason)   Problem: RH Grooming Goal: LTG Patient will perform grooming w/assist,cues/equip (OT) Description: LTG: Patient will perform grooming with assist, with/without cues using equipment (OT) Outcome: Not Met (add Reason)   Problem: RH Bathing Goal: LTG Patient will bathe all body parts with assist levels (OT) Description: LTG: Patient will bathe all body parts with assist levels (OT) Outcome: Not Met (add Reason)   Problem: RH Dressing Goal: LTG Patient will perform upper body dressing (OT) Description: LTG Patient will perform upper body dressing with assist, with/without cues (OT). Outcome: Not Met (add Reason) Goal: LTG Patient will perform lower body dressing w/assist (OT) Description: LTG: Patient will perform lower body dressing with assist, with/without cues in positioning using equipment (OT) Outcome: Not Met (add Reason)   Problem: RH  Toileting Goal: LTG Patient will perform toileting task (3/3 steps) with assistance level (OT) Description: LTG: Patient will perform toileting task (3/3 steps) with assistance level (OT)  Outcome: Not Met (add Reason)   Problem: RH Vision Goal: RH LTG Vision (Specify) Outcome: Not Met (add Reason)   Problem: RH Functional Use of Upper Extremity Goal: LTG Patient will use RT/LT upper extremity as a (OT) Description: LTG: Patient will use right/left upper extremity as a stabilizer/gross assist/diminished/nondominant/dominant level with assist, with/without cues during functional activity (OT) Outcome: Not Met (add Reason)   Problem: RH Toilet Transfers Goal: LTG Patient will perform toilet transfers w/assist (OT) Description: LTG: Patient will perform toilet transfers with assist, with/without cues using equipment (OT) Outcome: Not Met (add Reason)   Problem: RH Tub/Shower Transfers Goal: LTG Patient will perform tub/shower transfers w/assist (OT) Description: LTG: Patient will perform tub/shower transfers with assist, with/without cues using equipment (OT) Outcome: Not Met (add Reason)   Problem: RH Attention Goal: LTG Patient will demonstrate this level of attention during functional activites (OT) Description: LTG:  Patient will demonstrate this level of attention during functional activites  (OT) Outcome: Not Met (add Reason)   Problem: RH Awareness Goal: LTG: Patient will demonstrate awareness during functional activites type of (OT) Description: LTG: Patient will demonstrate awareness during functional activites type of (OT) Outcome: Not Met (add Reason)   Problem: RH Pre-functional/Other (Specify) Goal: RH LTG OT (Specify) 1 Description: RH LTG OT (Specify) 1 Outcome: Not Met (add Reason)   Problem: RH Furniture Transfers Goal: LTG Patient will perform furniture transfers w/assist (OT/PT) Description: LTG: Patient will perform furniture transfers  with assistance  (OT/PT). Outcome: Not Met (add Reason)

## 2024-12-12 NOTE — Progress Notes (Signed)
 Inpatient Rehabilitation Discharge Medication Review by a Pharmacist  A complete drug regimen review was completed for this patient to identify any potential clinically significant medication issues.  High Risk Drug Classes Is patient taking? Indication by Medication  Antipsychotic No   Anticoagulant Yes Lovenox - vte ppx  Antibiotic No   Opioid No   Antiplatelet No   Hypoglycemics/insulin No   Vasoactive Medication Yes Coreg - HTN   Chemotherapy No   Other Yes Acetaminophen  - pain Osmolite/Prosource tube feeds - nutrition Multivitamin - supplement Potassium - supplement Senokot S - constipation     Type of Medication Issue Identified Description of Issue Recommendation(s)  Drug Interaction(s) (clinically significant)     Duplicate Therapy     Allergy     No Medication Administration End Date     Incorrect Dose     Additional Drug Therapy Needed     Significant med changes from prior encounter (inform family/care partners about these prior to discharge).    Other       Clinically significant medication issues were identified that warrant physician communication and completion of prescribed/recommended actions by midnight of the next day:  No   Time spent performing this drug regimen review (minutes):  30   Rocky Slade, PharmD, BCPS Clinical Pharmacist 12/12/2024 4:11 PM  Contact: [phone] after 3 PM

## 2024-12-12 NOTE — Significant Event (Signed)
 Rapid Response Event Note   Reason for Call :  lethargic  Initial Focused Assessment:  Patient responds to painful stimuli, purposeful right sided movement though unable to follow commands. No movement left side. Pupils 3, reactive, left pupil sluggish. LUE, LLE with edema. Skin warm/dry. Neuro exam appears to be fluctuating this AM.   While in CT, pt with eyes open, followed commands on right side and said few words. Back to responding to pain upon returning to room.   145/70 (90) HR 65 RR 19 O2 95% RA CBG 101  Interventions/Plan of Care:  [facility] stat  Event Summary:  MD Notified: Emeline by primary RN Call Time: 1207 Arrival Time: 1212 End Time: 1240  Tonna Chiquita POUR, RN

## 2024-12-12 NOTE — H&P (Signed)
 "  NAME:  Dwayne [name], MRN:  [id number], DOB:  27-May-1946, LOS: 0 ADMISSION DATE:  (Not on file), CONSULTATION DATE:  12/11/2024 REFERRING MD:  Inpatient rehab, CHIEF COMPLAINT:  Worsening  vasogenic edema    History of Present Illness:  Dwayne [name] is a 79 year old male with vertigo for hypertension, prior DVT, prior PE anticoagulated with Xarelto , retroperitoneal bleed, and arrhythmia who initially presented to the ED at [facility] 1/10 from independent living facility after suffering a fall with reported headache.  Given mechanical fall with anticoagulation patient presented as a level 2 trauma.  On EMS arrival patient was seen with hemiparesis weakness and mild AMS.  Head CT on arrival with evidence of large right temporal intraparenchymal bleed.  Neurosurgery was consulted and recommendation initially made for anticoagulation reversal and close observation in the ICU.  See below for pertinent Hospital events   Pertinent  Medical History  hypertension, prior DVT, prior PE anticoagulated with Xarelto , retroperitoneal bleed, and arrhythmia   Significant Hospital Events: Including procedures, antibiotic start and stop dates in addition to other pertinent events   1/10 presented after suffering mechanical fall on blood thinner workup revealed large IPH with shift.  Repeat head CT with worsening bleed and mass effect neurosurgery recommended continued observation and hypertonic saline. 1/12 MRI brain most consistent with large posterior temporal infarct with hemorrhagic conversion with secondary acute hemorrhag setting of fall 1/13 hypertonic saline stopped and neurosurgery signed off 1/14 patient admitted to inpatient rehab, hematology/oncology consulted for assistance in determining ongoing anticoagulation 1/17 patient was seen with increased lethargy and recurrent left hemiparesis prompting repeat head CT while in inpatient rehab which revealed persistent right temporal lobe hematoma with  surrounding vasogenic edema and increased right to left midline shift measuring up to 1.6 cm.  Neurosurgery contacted and recommendations made to move back to ICU for close observation and potential surgical intervention  Interim History / Subjective:  As above   Objective    There were no vitals taken for this visit.       No intake or output data in the 24 hours ending 12/12/24 1553 There were no vitals filed for this visit.  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  Patient was medically managed with hypertonic saline during previous admission. - AM 1/17 patient with decreased mentation and left-sided weakness prompting repeat head CT which revealed worsening midline shift.  Neurosurgery consulted and recommended transfer back to ICU for initiation of hypertonic saline P: Admit back to neuro ICU for initiation of hypertonic saline Sodium goal 150-155 per neurosurgery SBP goal less than 160 Family has elected to not proceed with any surgical interventions Per neurosurgery okay to continue pharmacological DVT prophylaxis Neuroprotective measures aspiration  precautions Frequent neurochecks Repeat CT per neurosurgery  History of prior  DVT and PE - On arrival patient was anticoagulated with Xarelto  this was reversed on admission.  It appears hematology/oncology was consulted during previous admission with recommendations made to hold anticoagulation P: Continue prophylactic Lovenox   Hypertension P: Continue home carvedilol  SBP goal less than 160  Dysphagia P: Continue tube feeds   Labs   CBC: Recent Labs  Lab 12/05/24 1938 12/10/24 0957 12/12/24 1210  WBC 12.7* 9.9 9.9  NEUTROABS  --  6.3 6.1  HGB 15.0 11.9* 11.7*  HCT 42.6 35.9* 35.2*  MCV 84.2 90.4 88.0  PLT 162 103* 116*    Basic Metabolic Panel: Recent Labs  Lab 12/05/24 1938 12/05/24 2310 12/07/24 1802 12/08/24 0000 12/08/24 0606 12/08/24 1151 12/09/24 0550 12/09/24 1401 12/10/24 0957 12/11/24 0428 12/12/24 1210  NA 135   < > 158*   < > 160*   < > 158* 158* 152* 149* 141  K 4.4  --   --   --  3.4*  --   --   --  3.1* 3.3* 4.0  CL 99  --   --   --  127*  --   --   --  117* 114* 107  CO2 24  --   --   --  21*  --   --   --  26 27 25   GLUCOSE 92  --   --   --  139*  --   --   --  138* 115* 102*  BUN 14  --   --   --  38*  --   --   --  37* 35* 31*  CREATININE 1.07  --   --   --  1.07  --   --   --  0.92 0.85 0.75  CALCIUM 9.2  --   --   --  8.4*  --   --   --  7.9* 7.8* 8.2*  MG  --   --  2.1  --  2.3  --  1.9  --   --   --   --   PHOS  --   --  2.2*  --  2.4*  --  2.3*  --   --   --   --    < > = values in this interval not displayed.   GFR: Estimated Creatinine Clearance: 79.8 mL/min (by C-G formula based on SCr of 0.75 mg/dL). Recent Labs  Lab 12/05/24 1938 12/10/24 0957 12/12/24 1210  WBC 12.7* 9.9 9.9    Liver Function Tests: Recent Labs  Lab 12/05/24 1938 12/10/24 0957 12/12/24 1210  AST 72* 41 35  ALT 18 35 29  ALKPHOS 67 48 47  BILITOT 1.1 0.7 0.6  PROT 7.0 5.4* 5.4*  ALBUMIN 3.9 3.1* 3.0*   No results for input(s): LIPASE,  AMYLASE in the last 168 hours. No results for input(s): AMMONIA in the last 168 hours.  ABG    Component Value Date/Time   TCO2 27 12/05/2024 1024     Coagulation Profile: Recent Labs  Lab 12/05/24 1938  INR 1.1    Cardiac Enzymes: No results for input(s): CKTOTAL, CKMB, CKMBINDEX, TROPONINI in the last 168 hours.  HbA1C: Hgb A1c MFr Bld  Date/Time Value Ref Range Status  12/05/2024 07:38 PM 5.6 4.8 - 5.6 % Final    Comment:    (NOTE) Diagnosis of Diabetes The following HbA1c ranges recommended by the American Diabetes Association (ADA) may be used as an aid in the diagnosis  of diabetes mellitus.  Hemoglobin             Suggested A1C NGSP%              Diagnosis  <5.7                   Non Diabetic  5.7-6.4                Pre-Diabetic  >6.4                   Diabetic  <7.0                   Glycemic control for                       adults with diabetes.      CBG: Recent Labs  Lab 12/09/24 0359 12/09/24 0752 12/09/24 1137 12/09/24 1548 12/12/24 1209  GLUCAP 112* 143* 148* 169* 101*    Review of Systems:   Please see the history of present illness. All other systems reviewed and are negative   Past Medical History:  He,  has a past medical history of Arrhythmia, Bilateral pulmonary embolism (HCC) (05/28/2016), DVT of lower limb, acute (HCC), Hypertension, and Retroperitoneal bleed (12/25/2016).   Surgical History:   Past Surgical History:  Procedure Laterality Date   INGUINAL HERNIA REPAIR Left 01/22/2022   Procedure: OPEN LEFT INGUINAL HERNIA REPAIR WITH MESH;  Surgeon: Vernetta Berg, MD;  Location: [facility];  Service: General;  Laterality: Left;   INGUINAL HERNIA REPAIR Right 01/20/2024   Procedure: open right HERNIA REPAIR INGUINAL ADULT;  Surgeon: Vernetta Berg, MD;  Location: [facility];  Service: General;  Laterality: Right;  LMA   KNEE SURGERY       Social History:   reports that he has  never smoked. He has never used smokeless tobacco. He reports current alcohol  use. He reports that he does not use drugs.   Family History:  His family history includes Cancer in his paternal grandfather; Diabetes in his father; Stroke in his father and maternal grandmother.   Allergies Allergies[1]   Home Medications  Prior to Admission medications  Medication Sig Start Date End Date Taking? Authorizing Provider  acetaminophen  (TYLENOL ) 325 MG tablet Take 2 tablets (650 mg total) by mouth every 4 (four) hours as needed for mild pain (pain score 1-3) or fever (or temp > 37.5 C (99.5 F)). 12/09/24   de Clint Kill, Cortney E, NP  carvedilol  (COREG ) 3.125 MG tablet Place 1 tablet (3.125 mg total) into feeding tube 2 (two) times daily with a meal. 12/10/24   de Clint Kill, Cortney E, NP  Cholecalciferol (VITAMIN D3) 1000 units CAPS Take 1,000 Units by mouth daily at 6 (six) AM.    [provider]  lisinopril-hydrochlorothiazide (PRINZIDE,ZESTORETIC) 10-12.5 MG per tablet Take 1 tablet by mouth daily.    [provider]  Multiple Vitamin (MULTIVITAMIN WITH MINERALS) TABS tablet Place 1 tablet into feeding tube daily. 12/10/24   de Clint Kill, Cortney E, NP  Nutritional Supplements (FEEDING SUPPLEMENT, OSMOLITE 1.5 CAL,) LIQD Place 1,000 mLs into feeding tube continuous. 12/09/24   de Clint Kill, Cortney E, NP  Protein (FEEDING SUPPLEMENT, PROSOURCE TF20,) liquid Place 60 mLs into feeding tube 2 (two) times daily. 12/09/24   de Clint Kill, Cortney E, NP  senna-docusate (SENOKOT-S) 8.6-50 MG tablet Place 1 tablet into feeding tube 2 (two) times  daily. 12/09/24   de Clint Kill, Cortney E, NP  thiamine  (VITAMIN B-1) 100 MG tablet Place 1 tablet (100 mg total) into feeding tube daily. 12/10/24   de Clint Kill Earle FORBES, NP     Critical care time:   CRITICAL CARE Performed by: Shriya Aker D. Harris  Total critical care time: 40 minutes  Critical care time was exclusive of separately billable procedures  and treating other patients.  Critical care was necessary to treat or prevent imminent or life-threatening deterioration.  Critical care was time spent personally by me on the following activities: development of treatment plan with patient and/or surrogate as well as nursing, discussions with consultants, evaluation of patient's response to treatment, examination of patient, obtaining history from patient or surrogate, ordering and performing treatments and interventions, ordering and review of laboratory studies, ordering and review of radiographic studies, pulse oximetry and re-evaluation of patient's condition.  Rakwon Letourneau D. Harris, NP-C [facility] Personal contact information can be found on Amion  If no contact or response made please call [phone] 12/12/2024, 4:19 PM             [1]  Allergies Allergen Reactions   Penicillins    Thimerosal (Thiomersal) Other (See Comments)   "

## 2024-12-12 NOTE — Progress Notes (Signed)
 Speech Language Pathology Daily Session Note  Patient Details  Name: Dwayne [name] MRN: [id number] Date of Birth: 07-Sep-1946  Today's Date: 12/12/2024 SLP Individual Time: 9264-9194 SLP Individual Time Calculation (min): 30 min  Short Term Goals: Week 1: SLP Short Term Goal 1 (Week 1): Patient will attend to information presented at midline during functional tasks in 2/5 opportunities with max assist. SLP Short Term Goal 2 (Week 1): Patient will tolerate trials of Dys2+ solids with max cues for awareness and swallow safety. SLP Short Term Goal 3 (Week 1): Patient will sustain attention for 1-3 minutes during functional tasks with total assist. SLP Short Term Goal 4 (Week 1): Patient will follow 1 step directions in 80% of opportunities with max assist. SLP Short Term Goal 5 (Week 1): Patient will state 2 cognitive deficits given max assist.  Skilled Therapeutic Interventions: Skilled intervention focused on dysphagia and cognition. Pt very lethergic. Some improvement in alertness with cold wash cloth to face/neck.  Pt followed single step commands to open eyes, squeeze hand, wash face with wash cloth and put up one finger. He eventually woke up and consumed 4-5 bites of dys 1 breakfast tray and 1oz of thin liquids. He tolerated dys 1 diet however required increased time  due to prolonged mastication and lethergy.He eventually fell back asleep and was unable to stay alert enough for tx tasks or po trials. Session ended early.   Pain Pain Assessment Pain Scale: Faces Faces Pain Scale: No hurt  Therapy/Group: Individual Therapy  Recardo A Mansoor Hillyard 12/12/2024, 8:08 AM

## 2024-12-12 NOTE — Progress Notes (Addendum)
 Physical Therapy Session Note  Patient Details  Name: Dwayne [name] MRN: [id number] Date of Birth: Mar 13, 1946  Today's Date: 12/11/2024 PT Individual Time: 1405-1505 PT Individual Time Calculation (min): 60 min  Short Term Goals: Week 1:  PT Short Term Goal 1 (Week 1): Pt will roll side to side w/ max A PT Short Term Goal 2 (Week 1): Pt will transfer sup to sit w/ mod A consistently w/ bed features. PT Short Term Goal 3 (Week 1): Pt will transfer sit to stand to RW w/ max A  Skilled Therapeutic Interventions/Progress Updates:  Patient supine in bed on entrance to room. Patient alert and agreeable to PT session. Cortrak running. Wife present. +2 rehab tech present for most of session.   Patient with no pain complaint at start of session. Does demo difficulty with focus to vc, motor planning/ control, and execution of requested movements without direct cue requiring y/n answer prior to movement (I.e. can you see my hand - yes or no? Then touch my hand)  Therapeutic Activity: Bed Mobility: TotA with max multimodal cueing in order to position self in bed for all bed mobility. Is unable to position RLE to called position (bend your knee, push heel into bed with L foot, etc) and requires up to MaxA to perform. LLE is DEP. And so requires overall TotA for rolling side to side and for sidelying to sit.   Neuromuscular Re-ed: NMR facilitated during session with focus on seated balance, awareness, proprioception. Pt requires MaxA +1 to MaxA +2 to attain seated balance. Does not turn head to L and no eye movement past midline throughout session. Demos difficulty recognizing items directly in front of him. Strong push noted from RUE toward L side and provided with cues throughout for palmar positioning on bed far out to R side in order to overcome push. But he continues to push until elbow placement on bed. Trunk flexed forward.   With time of elbow on bed, pushing does decrease but continues  when positioned more upright and with trial of palm on bed again. He is fully unaware of L side impairment as he perseverates on need to be able to ambulate to bathroom. Continued to educate on L hemibody weakness and attempt to have pt recognize L side of body but continues to demonstrate reduced awareness.  Pt is able to spend 30+ min in seated position on EOB with diminishing ability to hold upright posture. But decreased push noted throughout. Is able to reach MinA level of seated balance.   NMR performed for improvements in motor control and coordination, balance, sequencing, judgement, and self confidence/ efficacy in performing all aspects of mobility at highest level of independence.   Returned supine with MaxA +2. Positioned head with 2 pillows d/t kyphotic posture.   Pt noted be incontinent of urine and initiated self care. Small BM also noted. He required TotA for all bed rolling despite multimodal cues for positioning. TotA for brief change and pericare.   During pericare, noted skin irritation under PICC line dressing in one small area of border of dressing. Related to RN and NS addressing with pictures and notifying IV team.   Patient supine in bed at end of session with brakes locked, bed alarm set, and all needs within reach.   Therapy Documentation Precautions:  Precautions Precautions: Fall Precaution/Restrictions Comments: Left neglect, does not cross midline to left, Pt seems to be opening eyes more. Restrictions Weight Bearing Restrictions Per Provider Order: No  Pain:  No pain noted throughout session.   Therapy/Group: Individual Therapy  Mliss DELENA Milliner PT, DPT, CSRS 12/11/2024, 5:01 PM

## 2024-12-12 NOTE — Progress Notes (Signed)
 Report given to 4N nurse, patient safely transferred to 4N. Family at bed side.   Geni Armor, LPN

## 2024-12-12 NOTE — Progress Notes (Signed)
 Occupational Therapy Session Note  Patient Details  Name: Dwayne [name] MRN: [id number] Date of Birth: 1945-12-02  Today's Date: 12/12/2024 OT Individual Time: 1115-1200 OT Individual Time Calculation (min): 45 min    Short Term Goals: Week 1:  OT Short Term Goal 1 (Week 1): Patient will sit at edge of bed with mod assist prior to active transfer OT Short Term Goal 2 (Week 1): Patient will turn head and locate item slightly left of midline with mod cueing, and min physical assist OT Short Term Goal 3 (Week 1): Patient will complete oral care with mod assist while seated in wheelchair OT Short Term Goal 4 (Week 1): Patient will locate left arm on body with min assist while seated in wheelchair  Skilled Therapeutic Interventions/Progress Updates:  Skilled OT session completed to address static sitting balance. Pt received supine in bed with wife present, pt is extremely lethargic and unable to report pain.   OT provided cold wash cloth, tactile cues, and sternal rub for arousal to participate in therapy. Wife encouraging OT and rehab tech to assist sitting pt EOB for participation reporting this increased participation at previous sessions. Supine>EOB Max +2, pt seated EOB eyes open and physically responding to verbal cues. Pt did not verbally respond throughout session. Pt sitting EOB with Max +2-Mod of 1 for 15 minutes, L lateral lean full next flexion and trunk extension present throughout, pt attempts to correct left lateral lean with cues to bring RUE to EOB. OT provided tactile cues to correct trunk flexion and neck flexion. Pt begins to display trunk extension to lie back down, EOB>Supine Max +2 to manage BLE and trunk. Pt inctonient of bowel and bladder, total A for toileting hygiene. RN present for catheting, wife and OT provide insight on increase lethargy. Vitals assessed BP: 145/70, SpO2: 50%, Pulse 65. Rapid response team called, pt supine in bed with bed alarm on direct handoff to  nursing staff.   Therapy Documentation Precautions:  Precautions Precautions: Fall Precaution/Restrictions Comments: Left neglect, does not cross midline to left, Pt seems to be opening eyes more. Restrictions Weight Bearing Restrictions Per Provider Order: No    Therapy/Group: Individual Therapy  Makaiyah Schweiger [place]-Chance, MS, OTR/L 12/12/2024, 7:40 AM

## 2024-12-12 NOTE — Consult Note (Incomplete)
 "  NAME:  [name], MRN:  [id number], DOB:  January 30, 1946, LOS: 3 ADMISSION DATE:  12/09/2024, CONSULTATION DATE: 12/12/2024 REFERRING MD: Patient rehab, CHIEF COMPLAINT: Abnormal head CT  History of Present Illness:  Dwayne [name] is a 79 year old male with vertigo for hypertension, prior DVT, prior PE anticoagulated with Xarelto , retroperitoneal bleed, and arrhythmia who initially presented to the ED at [facility] 1/10 from independent living facility after suffering a fall with reported headache.  Given mechanical fall with anticoagulation patient presented as a level 2 trauma.  On EMS arrival patient was seen with hemiparesis weakness and mild AMS.  Head CT on arrival with evidence of large right temporal intraparenchymal bleed.  Neurosurgery was consulted and recommendation initially made for anticoagulation reversal and close observation in the ICU.  See below for pertinent Hospital events  Pertinent  Medical History  hypertension, prior DVT, prior PE anticoagulated with Xarelto , retroperitoneal bleed, and arrhythmia  Significant Hospital Events: Including procedures, antibiotic start and stop dates in addition to other pertinent events   1/10 presented after suffering mechanical fall on blood thinner workup revealed large IPH with shift.  Repeat head CT with worsening bleed and mass effect neurosurgery recommended continued observation and hypertonic saline. 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  Neurosurgery contacted and recommendations made to move back to ICU for close observation and potential surgical intervention  Interim History / Subjective:  ***  Objective    Blood pressure (!) 145/70, pulse 65, temperature 99.5 F (37.5 C), temperature source Oral, resp. rate 19, height 5' 10 (1.778 m), weight 86 kg, SpO2 95%.        Intake/Output Summary (Last 24 hours) at 12/12/2024 1509 Last data filed at 12/12/2024 1324 Gross per 24 hour  Intake 400 ml  Output 700 ml  Net -300 ml   Filed Weights   12/09/24 1837  Weight: 86 kg    Examination: General: Chronically ill appearing elderly *** male/male on mechanical ventilation, in NAD HEENT: ETT, MM pink/moist, PERRL,  Neuro: *** CV: s1s2 regular rate and rhythm, no murmur, rubs, or gallops,  PULM:  *** GI: soft, bowel sounds active in all 4 quadrants, non-tender, non-distended, tolerating TF*** Extremities: warm/dry, *** edema  Skin: no rashes or lesions   Resolved problem list   Assessment and Plan     Labs   CBC: Recent Labs  Lab 12/05/24 1938 12/10/24 0957 12/12/24 1210  WBC 12.7* 9.9 9.9  NEUTROABS  --  6.3 6.1  HGB 15.0 11.9* 11.7*  HCT 42.6 35.9* 35.2*  MCV 84.2 90.4 88.0  PLT 162 103* 116*    Basic Metabolic Panel: Recent Labs  Lab 12/05/24 1938 12/05/24 2310 12/07/24 1802 12/08/24 0000 12/08/24 0606 12/08/24 1151 12/09/24 0550 12/09/24 1401 12/10/24 0957 12/11/24 0428 12/12/24 1210  NA 135   < > 158*   < > 160*   < > 158* 158* 152* 149* 141  K 4.4  --   --   --  3.4*  --   --   --  3.1* 3.3* 4.0  CL 99  --   --   --  127*  --   --   --  117* 114* 107  CO2 24  --   --   --  21*  --   --   --  26 27 25   GLUCOSE 92  --   --   --  139*  --   --   --  138* 115* 102*  BUN 14  --   --   --  38*  --   --   --  37* 35* 31*  CREATININE 1.07  --   --   --  1.07  --   --   --  0.92 0.85 0.75  CALCIUM 9.2  --   --   --  8.4*   --   --   --  7.9* 7.8* 8.2*  MG  --   --  2.1  --  2.3  --  1.9  --   --   --   --   PHOS  --   --  2.2*  --  2.4*  --  2.3*  --   --   --   --    < > = values in this interval not displayed.   GFR: Estimated Creatinine Clearance: 79.8 mL/min (by C-G formula based on SCr of 0.75 mg/dL). Recent Labs  Lab 12/05/24 1938 12/10/24 0957 12/12/24 1210  WBC 12.7* 9.9 9.9    Liver Function Tests: Recent Labs  Lab 12/05/24 1938 12/10/24 0957 12/12/24 1210  AST 72* 41 35  ALT 18 35 29  ALKPHOS 67 48 47  BILITOT 1.1 0.7 0.6  PROT 7.0 5.4* 5.4*  ALBUMIN 3.9 3.1* 3.0*   No results for input(s): LIPASE, AMYLASE in the last 168 hours. No results for input(s): AMMONIA in the last 168 hours.  ABG    Component Value Date/Time   TCO2 27 12/05/2024 1024     Coagulation Profile: Recent Labs  Lab 12/05/24 1938  INR 1.1    Cardiac Enzymes: No results for input(s): CKTOTAL, CKMB, CKMBINDEX, TROPONINI in the last 168 hours.  HbA1C: Hgb A1c MFr Bld  Date/Time Value Ref Range Status  12/05/2024 07:38 PM 5.6 4.8 - 5.6 % Final    Comment:    (NOTE) Diagnosis of Diabetes The following HbA1c ranges recommended by the American Diabetes Association (ADA) may be used as an aid in the diagnosis of diabetes mellitus.  Hemoglobin             Suggested A1C NGSP%              Diagnosis  <5.7                   Non Diabetic  5.7-6.4                Pre-Diabetic  >6.4                   Diabetic  <7.0                   Glycemic control for                       adults with diabetes.      CBG: Recent Labs  Lab 12/09/24 0359 12/09/24 0752 12/09/24 1137 12/09/24 1548 12/12/24 1209  GLUCAP 112* 143* 148* 169* 101*    Review of Systems:   ***  Past Medical History:  He,  has a past medical history of Arrhythmia, Bilateral pulmonary embolism (HCC) (05/28/2016), DVT of lower limb, acute (HCC), Hypertension, and Retroperitoneal bleed (12/25/2016).   Surgical  History:   Past Surgical History:  Procedure Laterality Date   INGUINAL HERNIA REPAIR Left 01/22/2022   Procedure: OPEN LEFT INGUINAL HERNIA REPAIR WITH MESH;  Surgeon: Vernetta Berg, MD;  Location: [facility];  Service: General;  Laterality: Left;   INGUINAL HERNIA REPAIR Right 01/20/2024   Procedure: open right HERNIA REPAIR INGUINAL ADULT;  Surgeon: Vernetta Berg, MD;  Location: [facility];  Service: General;  Laterality: Right;  LMA   KNEE SURGERY       Social History:   reports that he has never smoked. He has never used smokeless tobacco. He reports current alcohol  use. He reports that he does not use drugs.   Family History:  His family history includes Cancer in his paternal grandfather; Diabetes in his father; Stroke in his father and maternal grandmother.   Allergies Allergies[1]   Home Medications  Prior to Admission medications  Medication Sig Start Date End Date Taking? Authorizing Provider  acetaminophen  (TYLENOL ) 325 MG tablet Take 2 tablets (650 mg total) by mouth every 4 (four) hours as needed for mild pain (pain score 1-3) or fever (or temp > 37.5 C (99.5 F)). 12/09/24   de Clint Kill, Cortney E, NP  carvedilol  (COREG ) 3.125 MG tablet Place 1 tablet (3.125 mg total) into feeding tube 2 (two) times daily with a meal. 12/10/24   de Clint Kill, Cortney E, NP  Cholecalciferol (VITAMIN D3) 1000 units CAPS Take 1,000 Units by mouth daily at 6 (six) AM.    [provider]  lisinopril-hydrochlorothiazide (PRINZIDE,ZESTORETIC) 10-12.5 MG per tablet Take 1 tablet by mouth daily.    [provider]  Multiple Vitamin (MULTIVITAMIN WITH MINERALS) TABS tablet Place 1 tablet into feeding tube daily. 12/10/24   de Clint Kill, Cortney E, NP  Nutritional Supplements (FEEDING SUPPLEMENT, OSMOLITE 1.5 CAL,) LIQD Place 1,000 mLs into feeding tube continuous. 12/09/24   de Clint Kill, Cortney E, NP  Protein (FEEDING SUPPLEMENT, PROSOURCE TF20,)  liquid Place 60 mLs into feeding tube 2 (two) times daily. 12/09/24   de Clint Kill, Cortney E, NP  senna-docusate (SENOKOT-S) 8.6-50 MG tablet Place 1 tablet into feeding tube 2 (two) times daily. 12/09/24   de Clint Kill, Cortney E, NP  thiamine  (VITAMIN B-1) 100 MG tablet Place 1 tablet (100 mg total) into feeding tube daily. 12/10/24   de Clint Kill Earle FORBES, NP     Critical care time: ***              [1]  Allergies Allergen Reactions   Penicillins    Thimerosal (Thiomersal) Other (See Comments)   "

## 2024-12-13 ENCOUNTER — Inpatient Hospital Stay (HOSPITAL_COMMUNITY)

## 2024-12-13 DIAGNOSIS — G936 Cerebral edema: Secondary | ICD-10-CM

## 2024-12-13 DIAGNOSIS — Z86711 Personal history of pulmonary embolism: Secondary | ICD-10-CM

## 2024-12-13 DIAGNOSIS — Z86718 Personal history of other venous thrombosis and embolism: Secondary | ICD-10-CM

## 2024-12-13 DIAGNOSIS — D696 Thrombocytopenia, unspecified: Secondary | ICD-10-CM

## 2024-12-13 DIAGNOSIS — W19XXXA Unspecified fall, initial encounter: Secondary | ICD-10-CM

## 2024-12-13 DIAGNOSIS — I10 Essential (primary) hypertension: Secondary | ICD-10-CM

## 2024-12-13 DIAGNOSIS — I63531 Cerebral infarction due to unspecified occlusion or stenosis of right posterior cerebral artery: Secondary | ICD-10-CM

## 2024-12-13 DIAGNOSIS — I69391 Dysphagia following cerebral infarction: Secondary | ICD-10-CM

## 2024-12-13 DIAGNOSIS — G935 Compression of brain: Secondary | ICD-10-CM

## 2024-12-13 DIAGNOSIS — E870 Hyperosmolality and hypernatremia: Secondary | ICD-10-CM

## 2024-12-13 DIAGNOSIS — J9601 Acute respiratory failure with hypoxia: Secondary | ICD-10-CM

## 2024-12-13 LAB — BASIC METABOLIC PANEL WITH GFR
Anion gap: 8 (ref 5–15)
BUN: 29 mg/dL — ABNORMAL HIGH (ref 8–23)
CO2: 26 mmol/L (ref 22–32)
Calcium: 7.6 mg/dL — ABNORMAL LOW (ref 8.9–10.3)
Chloride: 114 mmol/L — ABNORMAL HIGH (ref 98–111)
Creatinine, Ser: 0.76 mg/dL (ref 0.61–1.24)
GFR, Estimated: >60 mL/min
Glucose, Bld: 117 mg/dL — ABNORMAL HIGH (ref 70–99)
Potassium: 4.2 mmol/L (ref 3.5–5.1)
Sodium: 148 mmol/L — ABNORMAL HIGH (ref 135–145)

## 2024-12-13 LAB — CBC
HCT: 34.1 % — ABNORMAL LOW (ref 39.0–52.0)
Hemoglobin: 11.5 g/dL — ABNORMAL LOW (ref 13.0–17.0)
MCH: 29.9 pg (ref 26.0–34.0)
MCHC: 33.7 g/dL (ref 30.0–36.0)
MCV: 88.6 fL (ref 80.0–100.0)
Platelets: 116 K/uL — ABNORMAL LOW (ref 150–400)
RBC: 3.85 MIL/uL — ABNORMAL LOW (ref 4.22–5.81)
RDW: 13.3 % (ref 11.5–15.5)
WBC: 9.4 K/uL (ref 4.0–10.5)
nRBC: 0 % (ref 0.0–0.2)

## 2024-12-13 LAB — SODIUM
Sodium: 149 mmol/L — ABNORMAL HIGH (ref 135–145)
Sodium: 150 mmol/L — ABNORMAL HIGH (ref 135–145)
Sodium: 149 mmol/L — ABNORMAL HIGH (ref 135–145)
Sodium: 153 mmol/L — ABNORMAL HIGH (ref 135–145)

## 2024-12-13 LAB — GLUCOSE, CAPILLARY: Glucose-Capillary: 103 mg/dL — ABNORMAL HIGH (ref 70–99)

## 2024-12-13 LAB — PHOSPHORUS: Phosphorus: 3.4 mg/dL (ref 2.5–4.6)

## 2024-12-13 LAB — MAGNESIUM: Magnesium: 2.1 mg/dL (ref 1.7–2.4)

## 2024-12-13 MED ORDER — SODIUM CHLORIDE 0.9 % IV SOLN: INTRAVENOUS | Status: AC | PRN

## 2024-12-13 MED ORDER — POLYETHYLENE GLYCOL 3350 17 G PO PACK: 17.0000 g | PACK | Freq: Every day | ORAL | Status: DC | PRN

## 2024-12-13 MED ORDER — SENNA 8.6 MG PO TABS: 1.0000 | ORAL_TABLET | Freq: Two times a day (BID) | ORAL | Status: DC | PRN

## 2024-12-13 MED ORDER — SODIUM CHLORIDE 0.9 % IV SOLN
2.0000 g | INTRAVENOUS | Status: DC
  Administered 2024-12-13 – 2024-12-15 (×3): 2 g via INTRAVENOUS
  Filled 2024-12-13 (×3): qty 20

## 2024-12-13 MED ORDER — HYDRALAZINE HCL 20 MG/ML IJ SOLN
10.0000 mg | INTRAMUSCULAR | Status: DC | PRN
  Administered 2024-12-13 – 2024-12-15 (×4): 10 mg via INTRAVENOUS
  Filled 2024-12-13 (×5): qty 1

## 2024-12-13 NOTE — Progress Notes (Signed)
 PT Cancellation Note  Patient Details Name: MATISSE SALAIS MRN: [id number] DOB: 1946/06/18   Cancelled Treatment:    Reason Eval/Treat Not Completed: Medical issues which prohibited therapy  Per RN has had periods of respiratory distress and agitation. Pt is currently resting and RN requested hold therapy at this time.    Macario RAMAN, PT Acute Rehabilitation Services  Office [phone]   Macario SHAUNNA Soja 12/13/2024, 12:51 PM

## 2024-12-13 NOTE — Progress Notes (Signed)
 eLink Physician-Brief Progress Note Patient Name: Dwayne [name] DOB: September 20, 1946 MRN: [id number]   Date of Service  12/13/2024  HPI/Events of Note  Critical shortage of IV labetalol , has been hypertensive  eICU Interventions  Add as needed hydralazine  as a second line agent     Intervention Category Intermediate Interventions: Hypertension - evaluation and management  Amylee Lodato 12/13/2024, 2:13 AM

## 2024-12-13 NOTE — Progress Notes (Signed)
 0900:  Patient is very restless with audible wheezing.  Blood pressure elevated with repeat checks.  PRN given and CCM notified.  Neurosurgery rounded and is also aware.

## 2024-12-13 NOTE — Progress Notes (Signed)
 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 Now with lethargy and CT showing severe peri-lesional edema causing significantly worse midline shift and hydrocephalus  LOS: 1 day    Plan: No surgery planned. See note from 1/17 Hypertonic saline with goal Na 150-155 SBP<160 Repeat CT in AM Diet and activity as tolerated from my standpoint Ok to continue DVT chemoppx   Subjective: Transferred to ICU and hypertonic saline initiated.  Sodium this morning 149.  Very agitated with nursing cares  Objective: Vital signs in last 24 hours: Temp:  [98 F (36.7 C)-100 F (37.8 C)] 99.6 F (37.6 C) (01/18 0800) Pulse Rate:  [51-71] 57 (01/18 0700) Resp:  [15-26] 15 (01/18 0700) BP: (138-180)/(62-86) 154/72 (01/18 0700) SpO2:  [89 %-96 %] 95 % (01/18 0700) Weight:  [84.5 kg] 84.5 kg (01/18 0500)  Intake/Output from previous day: 01/17 0701 - 01/18 0700 In: 2071.5 [I.V.:1062.5; NG/GT:979; IV Piggyback:30] Out: 1050 [Urine:1050] Intake/Output this shift: No intake/output data recorded.  Exam: GCS 2E 3V 42M Agitated PERRL Says a few words LUE and LLE plegia RUE and RLE localizes briskly  Lab Results: Recent Labs    12/12/24 1210 12/13/24 0240  WBC 9.9 9.4  HGB 11.7* 11.5*  HCT 35.2* 34.1*  PLT 116* 116*   BMET Recent Labs    12/12/24 1210 12/12/24 1704 12/13/24 0240 12/13/24 0414  NA 141   < > 148* 149*  K 4.0  --  4.2  --   CL 107  --  114*  --   CO2 25  --  26  --   GLUCOSE 102*  --  117*  --   BUN 31*  --  29*  --   CREATININE 0.75  --  0.76  --   CALCIUM 8.2*  --  7.6*  --    < > = values in this interval not displayed.       Dorn JONELLE Glade 12/13/2024, 8:29 AM

## 2024-12-13 NOTE — Progress Notes (Signed)
 OT Cancellation Note  Patient Details Name: Dwayne [name] MRN: [id number] DOB: Sep 30, 1946   Cancelled Treatment:    Reason Eval/Treat Not Completed: Patient not medically ready  Ely Molt 12/13/2024, 12:50 PM

## 2024-12-13 NOTE — Evaluation (Signed)
 Clinical/Bedside Swallow Evaluation Patient Details  Name: Dwayne [name] MRN: [id number] Date of Birth: October 31, 1946  Today's Date: 12/13/2024 Time: SLP Start Time (ACUTE ONLY): 0906 SLP Stop Time (ACUTE ONLY): 0916 SLP Time Calculation (min) (ACUTE ONLY): 10 min  Past Medical History:  Past Medical History:  Diagnosis Date   Arrhythmia    Bilateral pulmonary embolism (HCC) 05/28/2016   03/03/16   DVT of lower limb, acute (HCC)    left popliteal tibial and peroneal vein   Hypertension    Retroperitoneal bleed 12/25/2016   Cough x 1 week on Xarelto  extensive abdominal wall hematoma tracking down to penis & scrotum   Past Surgical History:  Past Surgical History:  Procedure Laterality Date   INGUINAL HERNIA REPAIR Left 01/22/2022   Procedure: OPEN LEFT INGUINAL HERNIA REPAIR WITH MESH;  Surgeon: Vernetta Berg, MD;  Location: [facility];  Service: General;  Laterality: Left;   INGUINAL HERNIA REPAIR Right 01/20/2024   Procedure: open right HERNIA REPAIR INGUINAL ADULT;  Surgeon: Vernetta Berg, MD;  Location: [facility];  Service: General;  Laterality: Right;  LMA   KNEE SURGERY     HPI:  Dwayne [name] is a 79 y/o gentleman with a history of HTN, DVT & PE, previously on DOAC who presented on 1/10 with headache, ewakness, found to have a ICH. He was discharged to CIR on 1/14 transfer to ICU due to increased lethary; repeat head CT demonstrated worse edema. CXR 1/17 Small bilateral pleural effusions, unchanged, mild cephalization of pulmonary vasculature without overt pulmonary edema, hazy bibasilar lung opacities, unchanged.  MBS 1/14 full liquid, upgraded Dys 1/thin on CIR. PMH: HTN, DVT, Bil pul embolism    Assessment / Plan / Recommendation  Clinical Impression  Indications of oropharyngeal dysphagia, poor mentation, respiratory concerns. Not ready for po's or instrumental study. May have ice chips after oral care. ST will follow.  Pt demonstrated  suspected pharyngeal dysphagia in the setting of confusion (?delirium) and respiratory concern.He did not follow directions, confusion, restless, decrerased awareness. Wheezing noted at baeline with respiratory rate in 20's and lower 30's. Anterior spiil with thin, immediated cough with   cup sip thin with disorganized appearing swallow. Not ready for po's and likely needs instrumental assessment when appropriate. SLP Visit Diagnosis: Dysphagia, unspecified (R13.10)    Aspiration Risk  Moderate aspiration risk    Diet Recommendation           Other Recommendations Oral Care Recommendations: Oral care QID     Swallow Evaluation Recommendations Recommendations: NPO;Ice chips PRN after oral care Medication Administration: Via alternative means Oral care recommendations: Oral care QID (4x/day)   Assistance Recommended at Discharge    Functional Status Assessment Patient has had a recent decline in their functional status and demonstrates the ability to make significant improvements in function in a reasonable and predictable amount of time.  Frequency and Duration min 2x/week  2 weeks       Prognosis Prognosis for improved oropharyngeal function: Good Barriers to Reach Goals: Cognitive deficits      Swallow Study   General Date of Onset: 12/12/24 HPI: Dwayne [name] is a 79 y/o gentleman with a history of HTN, DVT & PE, previously on DOAC who presented on 1/10 with headache, ewakness, found to have a ICH. He was discharged to CIR on 1/14 transfer to ICU due to increased lethary; repeat head CT demonstrated worse edema. CXR 1/17 Small bilateral pleural effusions, unchanged, mild cephalization of pulmonary vasculature without overt  pulmonary edema, hazy bibasilar lung opacities, unchanged.  MBS 1/14 full liquid, upgraded Dys 1/thin on CIR. PMH: HTN, DVT, Bil pul embolism Type of Study: Bedside Swallow Evaluation Previous Swallow Assessment:  (see HPI) Diet Prior to this Study:  NPO Temperature Spikes Noted: No Respiratory Status: Room air History of Recent Intubation: No Behavior/Cognition: Confused;Distractible;Cooperative (awake, decreased awareness, kees eyes closed) Oral Cavity Assessment: Within Functional Limits Oral Care Completed by SLP: No Oral Cavity - Dentition: Adequate natural dentition Self-Feeding Abilities: Needs assist Patient Positioning: Upright in bed Baseline Vocal Quality: Normal Volitional Cough: Cognitively unable to elicit Volitional Swallow: Unable to elicit    Oral/Motor/Sensory Function Overall Oral Motor/Sensory Function: Mild impairment Facial ROM:  (not follow commands for retraction) Facial Symmetry: Abnormal symmetry left;Suspected CN VII (facial) dysfunction Facial Strength: Reduced left;Suspected CN VII (facial) dysfunction Lingual ROM:  (would not follow commands)   Ice Chips Ice chips: Not tested   Thin Liquid Thin Liquid: Impaired Presentation: Cup;Spoon Oral Phase Impairments: Reduced labial seal Oral Phase Functional Implications: Left anterior spillage Pharyngeal  Phase Impairments: Cough - Immediate    Nectar Thick Nectar Thick Liquid: Not tested   Honey Thick Honey Thick Liquid: Not tested   Puree Puree: Impaired Presentation: Spoon Pharyngeal Phase Impairments: Suspected delayed Swallow   Solid     Solid: Not tested      Dwayne [name] Bull 12/13/2024,9:35 AM

## 2024-12-13 NOTE — Progress Notes (Addendum)
 Relatively extensive ectopy seen on monitor. RN reviewed electrolytes and contacted CCM notifying them of event and electrolytes out of range. No new orders at this time.

## 2024-12-13 NOTE — Progress Notes (Signed)
 "  NAME:  Dwayne [name], MRN:  [id number], DOB:  10/20/1946, LOS: 1 ADMISSION DATE:  12/12/2024, CONSULTATION DATE:  12/11/2024 REFERRING MD:  Inpatient rehab, CHIEF COMPLAINT:  Worsening  vasogenic edema    History of Present Illness:  Dwayne [name] is a 79 year old male with vertigo for hypertension, prior DVT, prior PE anticoagulated with Xarelto , retroperitoneal bleed, and arrhythmia who initially presented to the ED at [facility] 1/10 from independent living facility after suffering a fall with reported headache.  Given mechanical fall with anticoagulation patient presented as a level 2 trauma.  On EMS arrival patient was seen with hemiparesis weakness and mild AMS.  Head CT on arrival with evidence of large right temporal intraparenchymal bleed.  Neurosurgery was consulted and recommendation initially made for anticoagulation reversal and close observation in the ICU.  See below for pertinent Hospital events   Pertinent  Medical History  hypertension, prior DVT, prior PE anticoagulated with Xarelto , retroperitoneal bleed, and arrhythmia   Significant Hospital Events: Including procedures, antibiotic start and stop dates in addition to other pertinent events   1/10 presented after suffering mechanical fall on blood thinner workup revealed large IPH with shift.  Repeat head CT with worsening bleed and mass effect neurosurgery recommended continued observation and hypertonic saline. 1/12 MRI brain most consistent with large posterior temporal infarct with hemorrhagic conversion with secondary acute hemorrhag setting of fall 1/13 hypertonic saline stopped and neurosurgery signed off 1/14 patient admitted to inpatient rehab, hematology/oncology consulted for assistance in determining ongoing anticoagulation 1/17 patient was seen with increased lethargy and recurrent left hemiparesis prompting repeat head CT while in inpatient rehab which revealed persistent right temporal lobe hematoma with  surrounding vasogenic edema and increased right to left midline shift measuring up to 1.6 cm.  Neurosurgery contacted and recommendations made to move back to ICU for close observation and potential surgical intervention  Interim History / Subjective:  Patient's gait intermittently agitated and restless Started spiking fever with Tmax 102.7 Complaining of headache  Objective    Blood pressure (!) 148/63, pulse 79, temperature (!) 102.7 F (39.3 C), temperature source Axillary, resp. rate (!) 25, weight 84.5 kg, SpO2 93%.        Intake/Output Summary (Last 24 hours) at 12/13/2024 1354 Last data filed at 12/13/2024 1000 Gross per 24 hour  Intake 2473.53 ml  Output 1050 ml  Net 1423.53 ml   Filed Weights   12/13/24 0500  Weight: 84.5 kg    Examination: General: Acute on chronically ill-appearing elderly male, lying on the bed HEENT: [place]/AT, eyes anicteric.  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  Patient was medically managed with hypertonic saline during previous admission. On 1/17 patient with decreased mentation and left-sided weakness prompting repeat head CT which revealed worsening midline shift Neurosurgery was consulted Patient was transferred back to ICU Continue neuro  watch Patient was restarted back on hypertonic saline  with boluses, now serum sodium increased to 149 Keep head of the bed elevated Maintain SBP 130-150  History of prior DVT and PE On arrival patient was anticoagulated with Xarelto  this was reversed on admission with Kcentra .  It appears hematology/oncology was consulted during previous admission with recommendations made to hold anticoagulation Continue SCDs Not candidate for therapeutic anticoagulation  Hypertension Continue home carvedilol  SBP goal 130 - 150  Dysphagia due to acute stroke Continue tube feeds  Thrombocytopenia due to critical illness Closely monitor platelet count, watch for signs of bleeding   Labs   CBC: Recent Labs  Lab 12/10/24 0957 12/12/24 1210 12/13/24 0240  WBC 9.9 9.9 9.4  NEUTROABS 6.3 6.1  --   HGB 11.9* 11.7* 11.5*  HCT 35.9* 35.2* 34.1*  MCV 90.4 88.0 88.6  PLT 103* 116* 116*    Basic Metabolic Panel: Recent Labs  Lab 12/07/24 1802 12/08/24 0000 12/08/24 0606 12/08/24 1151 12/09/24 0550 12/09/24 1401 12/10/24 0957 12/11/24 0428 12/12/24 1210 12/12/24 1704 12/12/24 2250 12/13/24 0240 12/13/24 0414 12/13/24 0727 12/13/24 1203  NA 158*   < > 160*   < > 158*   < > 152* 149* 141   < > 146* 148* 149* 150* 149*  K  --   --  3.4*  --   --   --  3.1* 3.3* 4.0  --   --  4.2  --   --   --   CL  --   --  127*  --   --   --  117* 114* 107  --   --  114*  --   --   --   CO2  --   --  21*  --   --   --  26 27 25   --   --  26  --   --   --   GLUCOSE  --   --  139*  --   --   --  138* 115* 102*  --   --  117*  --   --   --   BUN  --   --  38*  --   --   --  37* 35* 31*  --   --  29*  --   --   --   CREATININE  --   --  1.07  --   --   --  0.92 0.85 0.75  --   --  0.76  --   --   --   CALCIUM  --   --  8.4*  --   --   --  7.9* 7.8* 8.2*  --   --  7.6*  --   --   --   MG 2.1  --  2.3  --  1.9  --   --   --   --   --   --  2.1  --   --   --   PHOS 2.2*  --  2.4*  --  2.3*  --   --   --   --    --   --  3.4  --   --   --    < > = values in this interval not displayed.   GFR: Estimated Creatinine Clearance: 79.8 mL/min (by C-G formula based on SCr of 0.76 mg/dL). Recent Labs  Lab 12/10/24 0957 12/12/24 1210 12/13/24 0240  WBC 9.9 9.9 9.4    Liver Function Tests: Recent Labs  Lab 12/10/24 0957 12/12/24 1210  AST 41 35  ALT 35 29  ALKPHOS 48 47  BILITOT 0.7 0.6  PROT 5.4* 5.4*  ALBUMIN 3.1* 3.0*   No results for input(s): LIPASE, AMYLASE in the last 168 hours. Recent Labs  Lab 12/12/24 1704  AMMONIA 25    ABG    Component Value Date/Time   TCO2 27 12/05/2024 1024     Coagulation Profile: No results for input(s): INR, PROTIME in the last 168 hours.   Cardiac Enzymes: No results for input(s): CKTOTAL, CKMB, CKMBINDEX, TROPONINI in the last 168 hours.  HbA1C: Hgb A1c MFr Bld  Date/Time Value Ref Range Status  12/05/2024 07:38 PM 5.6 4.8 - 5.6 % Final    Comment:    (NOTE) Diagnosis of Diabetes The following HbA1c ranges recommended by the American Diabetes Association (ADA) may be used as an aid in the diagnosis of diabetes mellitus.  Hemoglobin             Suggested A1C NGSP%              Diagnosis  <5.7                   Non Diabetic  5.7-6.4                Pre-Diabetic  >6.4                   Diabetic  <7.0                   Glycemic control for                       adults with diabetes.      CBG: Recent Labs  Lab 12/09/24 0752 12/09/24 1137 12/09/24 1548 12/12/24 1209 12/13/24 0754  GLUCAP 143* 148* 169* 101* 103*     The patient is critically ill due to acute right temporal intraparenchymal hemorrhage with cerebral edema and brain compression.  Critical care was necessary to treat or prevent imminent or life-threatening deterioration.  Critical care was time spent personally by me on the following activities: development of treatment plan with patient and/or surrogate as well as nursing, discussions with  consultants, evaluation of patient's response to treatment, examination of patient, obtaining history from patient or surrogate, ordering and performing treatments and interventions, ordering and review of laboratory studies, ordering and review of radiographic studies, pulse oximetry, re-evaluation of patient's condition and participation in multidisciplinary rounds.   During this encounter critical care time was devoted to patient care services described in this note for 37 minutes.     Valinda Novas, MD [facility] See Amion for pager If no response to pager, please call [phone] until 7pm After 7pm, Please call E-link [phone]  "

## 2024-12-13 NOTE — Progress Notes (Signed)
 eLink Physician-Brief Progress Note Patient Name: Dwayne [name] DOB: [birth year] MRN: [id number]   Date of Service  12/13/2024  HPI/Events of Note  Has anxiety with voiding,last blader scan > 250. Has PMH of retention, keeps pulling off external cath trying to void.  eICU Interventions  Standard retention protocol.     Intervention Category Minor Interventions: Routine modifications to care plan (e.g. PRN medications for pain, fever)  Alizza Sacra 12/13/2024, 9:32 PM

## 2024-12-13 NOTE — Plan of Care (Signed)
" °  Problem: Education: Goal: Knowledge of General Education information will improve Description: Including pain rating scale, medication(s)/side effects and non-pharmacologic comfort measures Outcome: Not Progressing   Problem: Health Behavior/Discharge Planning: Goal: Ability to manage health-related needs will improve Outcome: Not Progressing   Problem: Clinical Measurements: Goal: Ability to maintain clinical measurements within normal limits will improve Outcome: Not Progressing Goal: Will remain free from infection Outcome: Not Progressing Goal: Diagnostic test results will improve Outcome: Not Progressing Goal: Respiratory complications will improve Outcome: Not Progressing Goal: Cardiovascular complication will be avoided Outcome: Not Progressing   Problem: Activity: Goal: Risk for activity intolerance will decrease Outcome: Not Progressing   Problem: Nutrition: Goal: Adequate nutrition will be maintained Outcome: Not Progressing   Problem: Coping: Goal: Level of anxiety will decrease Outcome: Not Progressing   Problem: Elimination: Goal: Will not experience complications related to bowel motility Outcome: Not Progressing   Problem: Pain Managment: Goal: General experience of comfort will improve and/or be controlled Outcome: Not Progressing   "

## 2024-12-14 ENCOUNTER — Inpatient Hospital Stay (HOSPITAL_COMMUNITY)

## 2024-12-14 LAB — SODIUM
Sodium: 152 mmol/L — ABNORMAL HIGH (ref 135–145)
Sodium: 153 mmol/L — ABNORMAL HIGH (ref 135–145)
Sodium: 154 mmol/L — ABNORMAL HIGH (ref 135–145)
Sodium: 154 mmol/L — ABNORMAL HIGH (ref 135–145)
Sodium: 155 mmol/L — ABNORMAL HIGH (ref 135–145)
Sodium: >170 mmol/L (ref 135–145)

## 2024-12-14 LAB — BASIC METABOLIC PANEL WITH GFR
Anion gap: 10 (ref 5–15)
BUN: 30 mg/dL — ABNORMAL HIGH (ref 8–23)
CO2: 24 mmol/L (ref 22–32)
Calcium: 8.1 mg/dL — ABNORMAL LOW (ref 8.9–10.3)
Chloride: 122 mmol/L — ABNORMAL HIGH (ref 98–111)
Creatinine, Ser: 0.88 mg/dL (ref 0.61–1.24)
GFR, Estimated: >60 mL/min
Glucose, Bld: 132 mg/dL — ABNORMAL HIGH (ref 70–99)
Potassium: 3.7 mmol/L (ref 3.5–5.1)
Sodium: 156 mmol/L — ABNORMAL HIGH (ref 135–145)

## 2024-12-14 MED ORDER — AMLODIPINE BESYLATE 10 MG PO TABS
10.0000 mg | ORAL_TABLET | Freq: Every day | ORAL | Status: DC
  Administered 2024-12-14 – 2024-12-16 (×3): 10 mg
  Filled 2024-12-14 (×2): qty 1

## 2024-12-14 MED ORDER — PROSOURCE TF20 ENFIT COMPATIBL EN LIQD
60.0000 mL | Freq: Two times a day (BID) | ENTERAL | Status: DC
  Administered 2024-12-14 – 2024-12-16 (×4): 60 mL
  Filled 2024-12-14 (×4): qty 60

## 2024-12-14 MED ORDER — OSMOLITE 1.5 CAL PO LIQD
1000.0000 mL | ORAL | Status: DC
  Administered 2024-12-15 – 2024-12-16 (×2): 1000 mL

## 2024-12-14 NOTE — TOC Initial Note (Signed)
 Transition of Care [facility]) - Initial/Assessment Note    Patient Details  Name: Dwayne [name] MRN: [id number] Date of Birth: 01-May-1946  Transition of Care [facility]) CM/SW Contact:    Inocente GORMAN Kindle, LCSW Phone Number: 12/14/2024, 4:56 PM  Clinical Narrative:                 Patient admitted from CIR (from [facility] [place] with wife prior) with worsening cerebral edema. CSW following for discharge needs of return to CIR versus SNF rehab at [facility] as CIR's notes indicate higher level of care needs.     Expected Discharge Plan: IP Rehab Facility Barriers to Discharge: Continued Medical Work up   Patient Goals and CMS Choice Patient states their goals for this hospitalization and ongoing recovery are:: Rehab          Expected Discharge Plan and Services     Post Acute Care Choice: IP Rehab Living arrangements for the past 2 months: Apartment                                      Prior Living Arrangements/Services Living arrangements for the past 2 months: Apartment Lives with:: Spouse Patient language and need for interpreter reviewed:: Yes Do you feel safe going back to the place where you live?: Yes      Need for Family Participation in Patient Care: Yes (Comment) Care giver support system in place?: Yes (comment)   Criminal Activity/Legal Involvement Pertinent to Current Situation/Hospitalization: No - Comment as needed  Activities of Daily Living      Permission Sought/Granted Permission sought to share information with : Family Supports Permission granted to share information with : Yes, Verbal Permission Granted  Share Information with NAME: Sander Vida Johann [phone]           Emotional Assessment Appearance:: Appears stated age     Orientation: : Oriented to Self, Oriented to Place, Oriented to  Time, Oriented to Situation Alcohol  / Substance Use: Not Applicable Psych Involvement: No (comment)  Admission diagnosis:  Intracranial  hypertension [G93.2] Intraparenchymal hemorrhage of brain [facility]) [I61.9] Patient Active Problem List   Diagnosis Date Noted   Intracranial hypertension 12/12/2024   Intraparenchymal hemorrhage of brain (HCC) 12/12/2024   Traumatic subdural hematoma (HCC) 12/09/2024   Malnutrition of moderate degree 12/08/2024   ICH (intracerebral hemorrhage) (HCC) 12/05/2024   Retroperitoneal bleed 12/25/2016   Bilateral pulmonary embolism (HCC) 05/28/2016   DVT of lower limb, acute (HCC) 07/27/2015   PCP:  Charlott Dorn LABOR, MD Pharmacy:   [facility] - [place], [place] - [address] [place] [address] Phone: [phone] Fax: [phone]  [id number] - [address], [place] - [address] [place] [address] Phone: [phone] Fax: [phone]  [name] [address] [place] [address] Phone: [phone] Fax: [phone]     Social Drivers of Health (SDOH) Social History: SDOH Screenings   Food Insecurity: No Food Insecurity (12/05/2024)  Housing: Unknown (12/05/2024)  Transportation Needs: No Transportation Needs (12/08/2024)  Utilities: Not At Risk (12/08/2024)  Depression (PHQ2-9): Low Risk (10/05/2024)  Tobacco Use: Low Risk (12/09/2024)   SDOH Interventions:     Readmission Risk Interventions     No data to display

## 2024-12-14 NOTE — Progress Notes (Signed)
 Physical Therapy Note  Patient Details  Name: Dwayne [name] MRN: [id number] Date of Birth: Feb 07, 1946 Today's Date: 12/14/2024    Physical Therapy Discharge Note  This patient was unable to complete the inpatient rehab program due to progression of large R temporal IPH with vasogenic edema and R>L midline shift diagnosed via radiologist on 1/17 and moved back to acute care for increased monitoring; therefore did not meet their long term goals. Pt left the program at a Total assist level for their functional mobility/ transfers. This patient is being discharged from PT services at this time.  Pt's perception of pain in the last five days was: unable to answer at this time.    See CareTool for functional status details  If the patient is able to return to inpatient rehabilitation within 3 midnights, this may be considered an interrupted stay and therapy services will resume as ordered. Modification and reinstatement of their goals will be made upon completion of therapy service reevaluations.     Mliss DELENA Milliner PT, DPT, CSRS 12/14/2024, 12:27 PM

## 2024-12-14 NOTE — Progress Notes (Signed)
 Speech Language Pathology Treatment: Dysphagia  Patient Details Name: EPIFANIO LABRADOR MRN: [id number] DOB: 12/03/1945 Today's Date: 12/14/2024 Time: 8969-8957 SLP Time Calculation (min) (ACUTE ONLY): 12 min  Assessment / Plan / Recommendation Clinical Impression  Pt is known to this SLP and now presents with increased lethargy and WOB with tachypnea (RR consistently >30) in comparison to last visit 1/14. Immediate coughing and wet eructation now follow sips of thin liquids, regardless of volume. Oral transit remains disorganized but oral containment is improved compared to previous date. Respiratory status appears to limit endurance and further affects already impaired coordination.   PLAN: Continue NPO with the exception of ice chips. Cortrak is in place for nutrition/medication. SLP will continue following for readiness to initiate PO diet vs complete an MBS.    HPI HPI: Mr. Brinkmeier is a 79 y/o gentleman with a history of HTN, DVT & PE, previously on DOAC who presented on 1/10 with headache, weakness, found to have a ICH. He was discharged to CIR on 1/14 transfer to ICU due to increased lethary; repeat head CT demonstrated worse edema. CXR 1/17 Small bilateral pleural effusions, unchanged, mild cephalization of pulmonary vasculature without overt pulmonary edema, hazy bibasilar lung opacities, unchanged.  MBS 1/14 full liquid, upgraded Dys 1/thin on CIR. PMH: HTN, DVT, Bil pul embolism      SLP Plan           Swallow Evaluation Recommendations   Recommendations: NPO;Ice chips PRN after oral care Medication Administration: Via alternative means Oral care recommendations: Oral care QID (4x/day);Oral care before ice chips/water      Recommendations                     Oral care QID;Oral care prior to ice chip/H20   Frequent or constant Supervision/Assistance Dysphagia, unspecified (R13.10)           Damien Blumenthal, M.A., CCC-SLP Speech Language Pathology, Acute  Rehabilitation Services  Secure Chat preferred [phone]   12/14/2024, 11:01 AM

## 2024-12-14 NOTE — Evaluation (Signed)
 Physical Therapy Evaluation Patient Details Name: Dwayne [name] MRN: [id number] DOB: [birth year] Today's Date: 12/14/2024  History of Present Illness  Pt is a 79 y.o. male who presented 12/05/24 with headache and lethargy x24 hours, x2 falls, and progressive confusion and L-sided weakness. CT head showed large acute right temporal parenchymal hemorrhage with moderate edema and leftward midline shift, increasing in size on repeat imaging. 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 Transferred to AIR 12/09/24 and returned to acute 12/12/24 after patient became lethargic and head CT showing increase in midline shift and left lateral ventricle dilation. PMH: DVT, PE on anticoagulation, HTN, retroperitoneal bleed, arrythmia   Clinical Impression  Pt admitted with above diagnosis. Prior to 1/10-1/14 admission, pt lived at ILF with spouse, independent. Pt currently with functional limitations due to the deficits listed below (see PT Problem List). On eval, pt required +2 max assist bed mobility, and +2 total assist squat pivot transfer. Ataxic. No active movement noted LUE. 2/5 LLE. L neglect. Unable to cross midline visually. Pt will benefit from acute skilled PT to increase their independence and safety with mobility to allow discharge. Post acute, pt would benefit from further therapy in inpatient setting > 3 hours/day.          If plan is discharge home, recommend the following: A lot of help with walking and/or transfers;A little help with bathing/dressing/bathroom;Assist for transportation;Supervision due to cognitive status;Assistance with feeding;Assistance with cooking/housework;Direct supervision/assist for medications management;Direct supervision/assist for financial management   Can travel by private vehicle        Equipment Recommendations Other (comment) (TBD next venue)  Recommendations for Other Services  Rehab consult    Functional Status Assessment Patient has had a recent  decline in their functional status and demonstrates the ability to make significant improvements in function in a reasonable and predictable amount of time.     Precautions / Restrictions Precautions Precautions: Fall;Other (comment) Precaution/Restrictions Comments: L neglect, coretrak      Mobility  Bed Mobility Overal bed mobility: Needs Assistance Bed Mobility: Supine to Sit     Supine to sit: +2 for physical assistance, Max assist     General bed mobility comments: assist for all aspects of mobility    Transfers Overall transfer level: Needs assistance Equipment used: 2 person hand held assist Transfers: Bed to chair/wheelchair/BSC       Squat pivot transfers: +2 physical assistance, +2 safety/equipment, Total assist     General transfer comment: knee buckling bilat. Maintained flexed posture    Ambulation/Gait               General Gait Details: unable  Stairs            Wheelchair Mobility     Tilt Bed    Modified Rankin (Stroke Patients Only) Modified Rankin (Stroke Patients Only) Pre-Morbid Rankin Score: No significant disability Modified Rankin: Severe disability     Balance Overall balance assessment: Needs assistance Sitting-balance support: Feet supported, Single extremity supported Sitting balance-Leahy Scale: Poor     Standing balance support: During functional activity, Reliant on assistive device for balance Standing balance-Leahy Scale: Poor Standing balance comment: reliant on external support                             Pertinent Vitals/Pain Pain Assessment Pain Assessment: Faces Faces Pain Scale: No hurt    Home Living Family/patient expects to be discharged to:: Private residence  Living Arrangements: Spouse/significant other Available Help at Discharge: Family Type of Home: Independent living facility Home Access: Elevator;Level entry       Home Layout: Multi-level Home Equipment: [place] - single  point Additional Comments: lives with wife in indep apartment at [employer]    Prior Function Prior Level of Function : Independent/Modified Independent             Mobility Comments: no AD, drives (prior to 1/10 admission)       Extremity/Trunk Assessment   Upper Extremity Assessment Upper Extremity Assessment: LUE deficits/detail LUE Deficits / Details: no active movement noted    Lower Extremity Assessment Lower Extremity Assessment: LLE deficits/detail LLE Deficits / Details: grossly 2/5    Cervical / Trunk Assessment Cervical / Trunk Assessment: Kyphotic  Communication   Communication Communication: Impaired Factors Affecting Communication: Reduced clarity of speech;Difficulty expressing self    Cognition Arousal: Alert Behavior During Therapy: Restless, Flat affect   PT - Cognitive impairments: Orientation, Awareness, Memory, Attention, Initiation, Sequencing, Problem solving, Safety/Judgement   Orientation impairments: Situation, Time                     Following commands: Impaired Following commands impaired: Follows one step commands with increased time, Follows one step commands inconsistently     Cueing Cueing Techniques: Verbal cues, Tactile cues, Visual cues     General Comments      Exercises     Assessment/Plan    PT Assessment Patient needs continued PT services  PT Problem List Decreased strength;Decreased range of motion;Decreased activity tolerance;Decreased balance;Decreased mobility;Decreased coordination;Decreased cognition       PT Treatment Interventions DME instruction;Gait training;Functional mobility training;Stair training;Therapeutic activities;Therapeutic exercise;Balance training;Neuromuscular re-education;Patient/family education    PT Goals (Current goals can be found in the Care Plan section)  Acute Rehab PT Goals Patient Stated Goal: not stated PT Goal Formulation: Patient unable to participate in goal  setting Time For Goal Achievement: 12/28/24 Potential to Achieve Goals: Good    Frequency Min 2X/week     Co-evaluation               AM-PAC PT 6 Clicks Mobility  Outcome Measure Help needed turning from your back to your side while in a flat bed without using bedrails?: A Lot Help needed moving from lying on your back to sitting on the side of a flat bed without using bedrails?: Total Help needed moving to and from a bed to a chair (including a wheelchair)?: Total Help needed standing up from a chair using your arms (e.g., wheelchair or bedside chair)?: Total Help needed to walk in hospital room?: Total Help needed climbing 3-5 steps with a railing? : Total 6 Click Score: 7    End of Session Equipment Utilized During Treatment: Gait belt Activity Tolerance: Patient tolerated treatment well Patient left: in chair;with call bell/phone within reach;with chair alarm set Nurse Communication: Mobility status PT Visit Diagnosis: Unsteadiness on feet (R26.81);Other abnormalities of gait and mobility (R26.89);Muscle weakness (generalized) (M62.81);History of falling (Z91.81)    Time: 9249-9191 PT Time Calculation (min) (ACUTE ONLY): 18 min   Charges:   PT Evaluation $PT Eval Moderate Complexity: 1 Mod   PT General Charges $$ ACUTE PT VISIT: 1 Visit         Sari MATSU., PT  Office # [phone]   [name] 12/14/2024, 10:06 AM

## 2024-12-14 NOTE — Progress Notes (Signed)
 Physical Therapy Treatment Patient Details Name: Dwayne [name] MRN: [id number] DOB: [birth year] Today's Date: 12/14/2024   History of Present Illness Pt is a 79 y.o. male who presented 12/05/24 with headache and lethargy x24 hours, x2 falls, and progressive confusion and L-sided weakness. CT head showed large acute right temporal parenchymal hemorrhage with moderate edema and leftward midline shift, increasing in size on repeat imaging. 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 Transferred to AIR 12/09/24 and returned to acute 12/12/24 after patient became lethargic and head CT showing increase in midline shift and left lateral ventricle dilation. PMH: DVT, PE on anticoagulation, HTN, retroperitoneal bleed, arrythmia    PT Comments  Assisted RN with transfer back to bed. +2 total assist SPT toward R, and +2 total assist sit to supine. Poor sitting balance. Following simple commands inconsistently. Cues to keep eyes open. L neglect. Pt supine in bed at end of session.     If plan is discharge home, recommend the following: A lot of help with walking and/or transfers;A little help with bathing/dressing/bathroom;Assist for transportation;Supervision due to cognitive status;Assistance with feeding;Assistance with cooking/housework;Direct supervision/assist for medications management;Direct supervision/assist for financial management   Can travel by private vehicle        Equipment Recommendations  Other (comment) (TBD next venue)    Recommendations for Other Services Rehab consult     Precautions / Restrictions Precautions Precautions: Fall;Other (comment) Precaution/Restrictions Comments: L neglect, coretrak     Mobility  Bed Mobility Overal bed mobility: Needs Assistance Bed Mobility: Sit to Supine     Supine to sit: +2 for physical assistance, Max assist Sit to supine: +2 for physical assistance, Total assist   General bed mobility comments: assist for all aspects of  mobility    Transfers Overall transfer level: Needs assistance Equipment used: 2 person hand held assist Transfers: Bed to chair/wheelchair/BSC       Squat pivot transfers: +2 physical assistance, +2 safety/equipment, Total assist     General transfer comment: pivot transfer recliner to bed toward R. R knee blocked to prevent buckling.    Ambulation/Gait               General Gait Details: unable   Stairs             Wheelchair Mobility     Tilt Bed    Modified Rankin (Stroke Patients Only) Modified Rankin (Stroke Patients Only) Pre-Morbid Rankin Score: No significant disability Modified Rankin: Severe disability     Balance Overall balance assessment: Needs assistance Sitting-balance support: Feet supported, Single extremity supported Sitting balance-Leahy Scale: Poor   Postural control: Left lateral lean Standing balance support: During functional activity, Reliant on assistive device for balance Standing balance-Leahy Scale: Zero Standing balance comment: reliant on external support                            Communication Communication Communication: Impaired Factors Affecting Communication: Reduced clarity of speech;Difficulty expressing self  Cognition Arousal: Alert Behavior During Therapy: Restless, Flat affect   PT - Cognitive impairments: Orientation, Awareness, Memory, Attention, Initiation, Sequencing, Problem solving, Safety/Judgement   Orientation impairments: Situation, Time                     Following commands: Impaired Following commands impaired: Follows one step commands with increased time, Follows one step commands inconsistently    Cueing Cueing Techniques: Verbal cues, Tactile cues, Visual cues  Exercises  General Comments General comments (skin integrity, edema, etc.): VSS on 2L      Pertinent Vitals/Pain Pain Assessment Pain Assessment: Faces Faces Pain Scale: No hurt    Home  Living Family/patient expects to be discharged to:: Private residence Living Arrangements: Spouse/significant other Available Help at Discharge: Family Type of Home: Independent living facility Home Access: Elevator;Level entry       Home Layout: Multi-level Home Equipment: [place] - single point Additional Comments: lives with wife in indep apartment at [employer]    Prior Function            PT Goals (current goals can now be found in the care plan section) Acute Rehab PT Goals Patient Stated Goal: not stated PT Goal Formulation: Patient unable to participate in goal setting Time For Goal Achievement: 12/28/24 Potential to Achieve Goals: Good Progress towards PT goals: Progressing toward goals    Frequency    Min 2X/week      PT Plan      Co-evaluation              AM-PAC PT 6 Clicks Mobility   Outcome Measure  Help needed turning from your back to your side while in a flat bed without using bedrails?: A Lot Help needed moving from lying on your back to sitting on the side of a flat bed without using bedrails?: Total Help needed moving to and from a bed to a chair (including a wheelchair)?: Total Help needed standing up from a chair using your arms (e.g., wheelchair or bedside chair)?: Total Help needed to walk in hospital room?: Total Help needed climbing 3-5 steps with a railing? : Total 6 Click Score: 7    End of Session Equipment Utilized During Treatment: Gait belt;Oxygen Activity Tolerance: Patient tolerated treatment well Patient left: in bed;with call bell/phone within reach;with nursing/sitter in room;with bed alarm set Nurse Communication: Mobility status PT Visit Diagnosis: Unsteadiness on feet (R26.81);Other abnormalities of gait and mobility (R26.89);Muscle weakness (generalized) (M62.81);History of falling (Z91.81)     Time: 8890-8879 PT Time Calculation (min) (ACUTE ONLY): 11 min  Charges:    $Therapeutic Activity: 8-22 mins PT  General Charges $$ ACUTE PT VISIT: 1 Visit                     Sari MATSU., PT  Office # [phone]    [name] 12/14/2024, 11:44 AM

## 2024-12-14 NOTE — Plan of Care (Signed)
" °  Problem: Clinical Measurements: Goal: Ability to maintain clinical measurements within normal limits will improve Outcome: Progressing Goal: Will remain free from infection Outcome: Progressing Goal: Respiratory complications will improve Outcome: Progressing   Problem: Nutrition: Goal: Adequate nutrition will be maintained Outcome: Progressing   Problem: Coping: Goal: Level of anxiety will decrease Outcome: Progressing   Problem: Elimination: Goal: Will not experience complications related to bowel motility Outcome: Progressing   Problem: Pain Managment: Goal: General experience of comfort will improve and/or be controlled Outcome: Progressing   Problem: Education: Goal: Knowledge of General Education information will improve Description: Including pain rating scale, medication(s)/side effects and non-pharmacologic comfort measures Outcome: Not Progressing   Problem: Health Behavior/Discharge Planning: Goal: Ability to manage health-related needs will improve Outcome: Not Progressing   Problem: Clinical Measurements: Goal: Diagnostic test results will improve Outcome: Not Progressing   Problem: Activity: Goal: Risk for activity intolerance will decrease Outcome: Not Progressing   "

## 2024-12-14 NOTE — Progress Notes (Signed)
 Inpatient Rehab Admissions Coordinator:   Note returned to acute setting following brief rehab admission.  I will place an order for consult, per our protocol, and f/u with our team about potential return.  Reche Lowers, PT, DPT Admissions Coordinator [phone] 12/14/24 4:58 PM

## 2024-12-14 NOTE — Evaluation (Addendum)
 Occupational Therapy Evaluation Patient Details Name: Dwayne [name] MRN: [id number] DOB: [birth year] Today's Date: 12/14/2024   History of Present Illness   Pt is a 79 y.o. male who presented 12/05/24 with headache and lethargy x24 hours, x2 falls, and progressive confusion and L-sided weakness. CT head showed large acute right temporal parenchymal hemorrhage with moderate edema and leftward midline shift, increasing in size on repeat imaging. 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 Transferred to AIR 12/09/24 and returned to acute 12/12/24 after patient became lethargic and head CT showing increase in midline shift and left lateral ventricle dilation. PMH: DVT, PE on anticoagulation, HTN, retroperitoneal bleed, arrythmia     Clinical Impressions Prior to hospitalization Dwayne [name] lived independently with his wife at [facility]. Evaluation limited by increased fatigue and lethargy from being OOB and working earlier with PT. Pt demonstrated difficulty maintaining an alert level of arousal and sustained attention to tasks. Overall requires max to total A with ADL tasks and Max A +2 with limited mobility. Patient will benefit from intensive inpatient follow-up therapy, >3 hours/day pending ability to participate, however wife will most likely need 24/7 assistance for husband after DC home. Acute OT to follow.  Nsg made aware of increased RR during session.      If plan is discharge home, recommend the following:   Assistance with cooking/housework;Assistance with feeding;Direct supervision/assist for medications management;Direct supervision/assist for financial management;Assist for transportation;Help with stairs or ramp for entrance;Two people to help with walking and/or transfers;Two people to help with bathing/dressing/bathroom     Functional Status Assessment   Patient has had a recent decline in their functional status and demonstrates the ability to make significant improvements  in function in a reasonable and predictable amount of time.     Equipment Recommendations   Wheelchair (measurements OT);Wheelchair cushion (measurements OT);Hospital bed;Hoyer lift;BSC/3in1     Recommendations for Other Services   Rehab consult (pending progress)     Precautions/Restrictions   Precautions Precautions: Fall;Other (comment) Precaution/Restrictions Comments: L neglect, coretrak Restrictions Weight Bearing Restrictions Per Provider Order: No     Mobility Bed Mobility Overal bed mobility: Needs Assistance Bed Mobility: Sit to Supine       Sit to supine: +2 for physical assistance, Total assist   General bed mobility comments: assist for all aspects of mobility    Transfers     Transfers:  (just returned to bed with PT)             General transfer comment: may benefit form use of Stedy      Balance   Sitting-balance support: Feet supported, Single extremity supported Sitting balance-Leahy Scale: Poor Sitting balance - Comments: pushing with R UE to the L, L lateral lean Postural control: Left lateral lean                                 ADL either performed or assessed with clinical judgement   ADL Overall ADL's : Needs assistance/impaired Eating/Feeding: NPO Eating/Feeding Details (indicate cue type and reason): asking for water  Grooming: Moderate assistance;Bed level;Sitting   Upper Body Bathing: Maximal assistance   Lower Body Bathing: Maximal assistance;Bed level   Upper Body Dressing : Total assistance   Lower Body Dressing: Total assistance               Functional mobility during ADLs: Total assistance;+2 for physical assistance (per PT eval)  Vision Baseline Vision/History: 1 Wears glasses Ability to See in Adequate Light: 1 Impaired Patient Visual Report: Peripheral vision impairment Vision Assessment?: Vision impaired- to be further tested in functional context; eyes closed 99% of session      Perception Perception: Impaired Preception Impairment Details: Inattention/Neglect     Praxis Praxis: Impaired  Per PT note, pt ataxic     Pertinent Vitals/Pain Pain Assessment Pain Assessment: Faces Faces Pain Scale: No hurt     Extremity/Trunk Assessment Upper Extremity Assessment Upper Extremity Assessment: LUE deficits/detail LUE Deficits / Details: no active movement noted LUE Sensation: decreased light touch;decreased proprioception LUE Coordination: decreased fine motor;decreased gross motor   Lower Extremity Assessment Lower Extremity Assessment: Defer to PT evaluation       Communication Communication Communication: Impaired Factors Affecting Communication: Reduced clarity of speech;Difficulty expressing self   Cognition Arousal: Lethargic Behavior During Therapy: Restless, Flat affect Cognition: Cognition impaired, Difficult to assess Difficult to assess due to: Level of arousal   Awareness: Intellectual awareness impaired, Online awareness impaired Memory impairment (select all impairments): Short-term memory, Working civil service fast streamer, Engineer, structural memory Attention impairment (select first level of impairment): Focused attention Executive functioning impairment (select all impairments): Initiation, Organization, Sequencing, Reasoning, Problem solving OT - Cognition Comments: will further assess; most likely affected by faigue as well                 Following commands: Impaired Following commands impaired: Follows one step commands with increased time, Follows one step commands inconsistently     Cueing  General Comments   Cueing Techniques: Verbal cues;Tactile cues;Visual cues  increased RR - nsg made aware   Exercises Exercises: Other exercises Other Exercises Other Exercises: bridging in supine using RLE x 3   Shoulder Instructions      Home Living Family/patient expects to be discharged to:: Private residence Living Arrangements:  Spouse/significant other Available Help at Discharge: Family Type of Home: Independent living facility Home Access: Elevator;Level entry     Home Layout: Multi-level     Bathroom Shower/Tub: Arts Development Officer Toilet: Handicapped height     Home Equipment: Medical Laboratory Scientific Officer - single point   Additional Comments: lives with wife in indep apartment at [employer] With: Spouse    Prior Functioning/Environment Prior Level of Function : Independent/Modified Independent             Mobility Comments: no AD, drives (prior to 1/10 admission) ADLs Comments: indep, manages medication    OT Problem List: Decreased strength;Decreased range of motion;Decreased activity tolerance;Impaired balance (sitting and/or standing);Decreased cognition;Decreased safety awareness;Decreased knowledge of use of DME or AE;Decreased knowledge of precautions;Pain;Impaired sensation;Impaired UE functional use;Decreased coordination;Impaired vision/perception   OT Treatment/Interventions: Self-care/ADL training;Therapeutic exercise;DME and/or AE instruction;Therapeutic activities;Balance training;Patient/family education;Cognitive remediation/compensation      OT Goals(Current goals can be found in the care plan section)   Acute Rehab OT Goals Patient Stated Goal: none stated OT Goal Formulation: Patient unable to participate in goal setting Time For Goal Achievement: 12/28/24 Potential to Achieve Goals: Good   OT Frequency:  Min 2X/week    Co-evaluation PT/OT/SLP Co-Evaluation/Treatment: Yes            AM-PAC OT 6 Clicks Daily Activity     Outcome Measure Help from another person eating meals?: Total (npo) Help from another person taking care of personal grooming?: A Lot Help from another person toileting, which includes using toliet, bedpan, or urinal?: Total Help from another person bathing (including washing, rinsing, drying)?: Total Help from  another person to put on and taking off  regular upper body clothing?: Total Help from another person to put on and taking off regular lower body clothing?: Total 6 Click Score: 7   End of Session Equipment Utilized During Treatment:  Laurent) Nurse Communication: Mobility status;Other (comment) (level of arousal)  Activity Tolerance: Patient limited by lethargy;Patient limited by fatigue Patient left: in bed;with call bell/phone within reach;with bed alarm set  OT Visit Diagnosis: Unsteadiness on feet (R26.81);Other abnormalities of gait and mobility (R26.89);Muscle weakness (generalized) (M62.81);Hemiplegia and hemiparesis;Pain;Cognitive communication deficit (R41.841);Other symptoms and signs involving the nervous system (R29.898);Low vision, both eyes (H54.2) Symptoms and signs involving cognitive functions: Cerebral infarction Hemiplegia - Right/Left: Left Hemiplegia - dominant/non-dominant: Non-Dominant Hemiplegia - caused by: Cerebral infarction                Time: 8891-8865 OT Time Calculation (min): 26 min Charges:  OT General Charges $OT Visit: 1 Visit OT Evaluation $OT Eval Moderate Complexity: 1 Mod  Hydee Fleece, OT/L   Acute OT Clinical Specialist Acute Rehabilitation Services Pager [phone] Office [phone]   [facility] 12/14/2024, 2:35 PM

## 2024-12-14 NOTE — Plan of Care (Signed)
 Patient with neurological changes; discharged to acute; neuro ICU for hypertonic saline/mannitol/possible surgical intervention ; unable to complete CIR program

## 2024-12-14 NOTE — Progress Notes (Signed)
 Initial Nutrition Assessment  DOCUMENTATION CODES:   Not applicable  INTERVENTION:   Decrease Osmolite 1.5 to 50 ml/hr (1200 ml/day) 60 ml ProSource TF20 BID  Provides: 1960 kcal, 115 grams protein, and 912 ml free water   No additional free water  while on hypertonic saline   NUTRITION DIAGNOSIS:   Inadequate oral intake related to dysphagia as evidenced by NPO status.  GOAL:   Patient will meet greater than or equal to 90% of their needs  MONITOR:   TF tolerance  REASON FOR ASSESSMENT:   New TF    ASSESSMENT:   Pt with PMH of HTN, prior DVT/PE on Xarelto , [facility] to [facility] after fall with large R ICH, tx to CIR, now readmitted to the ICU due to lethargy and increasing edema.   Pt discussed during ICU rounds and with RN and MD.  Pt transferred to ICU for increased swelling, neurosurgery following. Started on hypertonic saline.  Pt has cortrak in place (placed 1/12; tip gastric)    1/18 - tx from CIR to ICU due to lethargy and increased swelling  Medications reviewed and include: MVI with minerals, miralax   Hypertonic saline   Labs reviewed:  Na 154  Recent Labs  Lab 12/08/24 0606 12/09/24 0550 12/10/24 0957 12/11/24 0428 12/12/24 1210 12/13/24 0240  K 3.4*  --    < > 3.3* 4.0 4.2  MG 2.3 1.9  --   --   --  2.1  PHOS 2.4* 2.3*  --   --   --  3.4   < > = values in this interval not displayed.     NUTRITION - FOCUSED PHYSICAL EXAM:  Flowsheet Row Most Recent Value  Orbital Region No depletion  Upper Arm Region No depletion  Thoracic and Lumbar Region No depletion  Buccal Region Mild depletion  Temple Region Moderate depletion  Clavicle Bone Region Mild depletion  Clavicle and Acromion Bone Region Mild depletion  Scapular Bone Region Mild depletion  Dorsal Hand Unable to assess  Patellar Region Mild depletion  Anterior Thigh Region Mild depletion  Posterior Calf Region Mild depletion  Edema (RD Assessment) None  Hair Reviewed  Eyes  Reviewed  Mouth Unable to assess  Skin Reviewed  Nails Reviewed    Diet Order:   Diet Order             Diet NPO time specified  Diet effective now                   EDUCATION NEEDS:   Education needs have been addressed  Skin:  Skin Assessment: Reviewed RN Assessment  Last BM:  1/19 smear  Height:   Ht Readings from Last 1 Encounters:  12/09/24 5' 10 (1.778 m)    Weight:   Wt Readings from Last 1 Encounters:  12/14/24 84.6 kg    BMI:  Body mass index is 26.76 kg/m.  Estimated Nutritional Needs:   Kcal:  1800-2000  Protein:  90-115 grams  Fluid:  >1.8 L/day  Powell SQUIBB., RD, LDN, CNSC See AMiON for contact information

## 2024-12-14 NOTE — Progress Notes (Signed)
 Inpatient Rehabilitation Care Coordinator Discharge Note   Patient Details  Name: DSHAUN REPPUCCI MRN: [id number] Date of Birth: 01-01-1946   Discharge location: TRANSFERRED TO ACUTE DUE TO MEDICAL ISSUES  Length of Stay: 2 DAYS  Discharge activity level: TOTAL-MAX  Home/community participation: ACTIVE  Patient response un:Yzjouy Literacy - How often do you need to have someone help you when you read instructions, pamphlets, or other written material from your doctor or pharmacy?: Patient unable to respond  Patient response un:Dnrpjo Isolation - How often do you feel lonely or isolated from those around you?: Patient unable to respond  Services provided included: MD, RD, PT, OT, SLP, RN, CM, Pharmacy, SW  Financial Services:  Financial Services Utilized: Medicare    Choices offered to/list presented to: NA  Follow-up services arranged:  Other (Comment) (TRANSFERRED TO ACUTE)           Patient response to transportation need: Is the patient able to respond to transportation needs?: Yes In the past 12 months, has lack of transportation kept you from medical appointments or from getting medications?: No In the past 12 months, has lack of transportation kept you from meetings, work, or from getting things needed for daily living?: No   Patient/Family verbalized understanding of follow-up arrangements:  Yes  Individual responsible for coordination of the follow-up plan: WIFE  Confirmed correct DME delivered: Darcelle Herrada G 12/14/2024    Comments (or additional information):TRANSFERRED TO ACUTE HAD ONLY BEEN HERE FOR TWO DAYS ON CIR. PLAN TO [facility] ONCE DC FROM HOSPITAL DUE TO AMOUNT OF CARE WILL BE    Adlynn Lowenstein, [place] MATSU

## 2024-12-14 NOTE — Progress Notes (Signed)
 "  NAME:  Dwayne [name], MRN:  [id number], DOB:  [birth year], LOS: 2 ADMISSION DATE:  12/12/2024, CONSULTATION DATE:  12/11/2024 REFERRING MD:  Inpatient rehab, CHIEF COMPLAINT:  Worsening  vasogenic edema    History of Present Illness:  Dwayne [name] is a 79 year old male with vertigo for hypertension, prior DVT, prior PE anticoagulated with Xarelto , retroperitoneal bleed, and arrhythmia who initially presented to the ED at [facility] 1/10 from independent living facility after suffering a fall with reported headache.  Given mechanical fall with anticoagulation patient presented as a level 2 trauma.  On EMS arrival patient was seen with hemiparesis weakness and mild AMS.  Head CT on arrival with evidence of large right temporal intraparenchymal bleed.  Neurosurgery was consulted and recommendation initially made for anticoagulation reversal and close observation in the ICU.  See below for pertinent Hospital events   Pertinent  Medical History  hypertension, prior DVT, prior PE anticoagulated with Xarelto , retroperitoneal bleed, and arrhythmia   Significant Hospital Events: Including procedures, antibiotic start and stop dates in addition to other pertinent events   1/10 presented after suffering mechanical fall on blood thinner workup revealed large IPH with shift.  Repeat head CT with worsening bleed and mass effect neurosurgery recommended continued observation and hypertonic saline. 1/12 MRI brain most consistent with large posterior temporal infarct with hemorrhagic conversion with secondary acute hemorrhag setting of fall 1/13 hypertonic saline stopped and neurosurgery signed off 1/14 patient admitted to inpatient rehab, hematology/oncology consulted for assistance in determining ongoing anticoagulation 1/17 patient was seen with increased lethargy and recurrent left hemiparesis prompting repeat head CT while in inpatient rehab which revealed persistent right temporal lobe hematoma with  surrounding vasogenic edema and increased right to left midline shift measuring up to 1.6 cm.  Neurosurgery contacted and recommendations made to move back to ICU for close observation and potential surgical intervention 1/18 agitated and restless, spiked fever with Tmax 102.7, complaining of headache  Interim History / Subjective:  Patient became afebrile No overnight issues Much more awake and oriented today, asking for something to drink  Objective    Blood pressure (!) 164/80, pulse 71, temperature 97.8 F (36.6 C), temperature source Oral, resp. rate 20, weight 84.6 kg, SpO2 95%.        Intake/Output Summary (Last 24 hours) at 12/14/2024 0828 Last data filed at 12/14/2024 0700 Gross per 24 hour  Intake 2978.13 ml  Output 1400 ml  Net 1578.13 ml   Filed Weights   12/13/24 0500 12/14/24 0500  Weight: 84.5 kg 84.6 kg    Examination: General: Acute on chronically ill-appearing elderly male, sitting on recliner HEENT: [place]/AT, eyes anicteric.  Severely dry mucous membranes Neuro: Awake, following commands, left facial droop, antigravity on right side plegic left side with hemineglect.  Oriented to place and time Chest: Coarse breath sounds, no wheezes or rhonchi Heart: Regular rate and rhythm, no murmurs or gallops Abdomen: Soft, nontender, nondistended, bowel sounds present  Labs reviewed  Patient Lines/Drains/Airways Status     Active Line/Drains/Airways     Name Placement date Placement time Site Days   PICC Double Lumen 12/06/24 Right Basilic 38 cm 0 cm 12/06/24  8389  -- 8   External Urinary Catheter 12/13/24  1026  --  1   Small Bore Feeding Tube 10 Fr. 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  Patient was medically managed with hypertonic saline during previous admission. On 1/17 patient with decreased mentation and left-sided weakness prompting repeat head CT which revealed worsening midline shift Neurosurgery is following, recommend watchful waiting Repeat head CT this morning showing stable midline shift of 1.6 cm and stable right temporal intraparenchymal hemorrhage Continue neuro watch Keep head of the bed elevated to 30 degrees Avoid hypotonic solution Decrease hypertonic saline to 50 cc/h Serum sodium is at goal between 150-155 Closely monitor serum sodium every 6 hours  History of prior DVT and PE On arrival patient was anticoagulated with Xarelto  this was reversed on admission with Kcentra .  It appears hematology/oncology was consulted during previous admission with recommendations made to hold anticoagulation And hold off on pharmacologic DVT prophylaxis for now Continue SCDs  Hypertension Patient blood pressure is not well-controlled Continue carvedilol  3.125 Started on amlodipine  10 mg once daily  Maintain SBP less than 160  Dysphagia due to acute stroke Continue tube feeds and dietary supplements  Thrombocytopenia due to critical illness Patient platelet counts are stable between 100-120 Watch for signs of bleeding   Labs   CBC: Recent Labs  Lab 12/10/24 0957 12/12/24 1210 12/13/24 0240  WBC 9.9 9.9 9.4  NEUTROABS 6.3 6.1  --   HGB 11.9* 11.7* 11.5*  HCT 35.9* 35.2* 34.1*  MCV 90.4 88.0 88.6  PLT 103* 116* 116*    Basic Metabolic Panel: Recent Labs  Lab 12/07/24 1802 12/08/24 0000 12/08/24 0606  12/08/24 1151 12/09/24 0550 12/09/24 1401 12/10/24 0957 12/11/24 0428 12/12/24 1210 12/12/24 1704 12/13/24 0240 12/13/24 0414 12/13/24 0727 12/13/24 1203 12/13/24 1830 12/13/24 2350 12/14/24 0409  NA 158*   < > 160*   < > 158*   < > 152* 149* 141   < > 148*   < > 150* 149* 153* 152* 154*  K  --   --  3.4*  --   --   --  3.1* 3.3* 4.0  --  4.2  --   --   --   --   --   --   CL  --   --  127*  --   --   --  117* 114* 107  --  114*  --   --   --   --   --   --   CO2  --   --  21*  --   --   --  26 27 25   --  26  --   --   --   --   --   --   GLUCOSE  --   --  139*  --   --   --  138* 115* 102*  --  117*  --   --   --   --   --   --   BUN  --   --  38*  --   --   --  37* 35* 31*  --  29*  --   --   --   --   --   --  CREATININE  --   --  1.07  --   --   --  0.92 0.85 0.75  --  0.76  --   --   --   --   --   --   CALCIUM  --   --  8.4*  --   --   --  7.9* 7.8* 8.2*  --  7.6*  --   --   --   --   --   --   MG 2.1  --  2.3  --  1.9  --   --   --   --   --  2.1  --   --   --   --   --   --   PHOS 2.2*  --  2.4*  --  2.3*  --   --   --   --   --  3.4  --   --   --   --   --   --    < > = values in this interval not displayed.   GFR: Estimated Creatinine Clearance: 79.8 mL/min (by C-G formula based on SCr of 0.76 mg/dL). Recent Labs  Lab 12/10/24 0957 12/12/24 1210 12/13/24 0240  WBC 9.9 9.9 9.4    Liver Function Tests: Recent Labs  Lab 12/10/24 0957 12/12/24 1210  AST 41 35  ALT 35 29  ALKPHOS 48 47  BILITOT 0.7 0.6  PROT 5.4* 5.4*  ALBUMIN 3.1* 3.0*   No results for input(s): LIPASE, AMYLASE in the last 168 hours. Recent Labs  Lab 12/12/24 1704  AMMONIA 25    ABG    Component Value Date/Time   TCO2 27 12/05/2024 1024     Coagulation Profile: No results for input(s): INR, PROTIME in the last 168 hours.   Cardiac Enzymes: No results for input(s): CKTOTAL, CKMB, CKMBINDEX, TROPONINI in the last 168 hours.  HbA1C: Hgb A1c MFr Bld  Date/Time  Value Ref Range Status  12/05/2024 07:38 PM 5.6 4.8 - 5.6 % Final    Comment:    (NOTE) Diagnosis of Diabetes The following HbA1c ranges recommended by the American Diabetes Association (ADA) may be used as an aid in the diagnosis of diabetes mellitus.  Hemoglobin             Suggested A1C NGSP%              Diagnosis  <5.7                   Non Diabetic  5.7-6.4                Pre-Diabetic  >6.4                   Diabetic  <7.0                   Glycemic control for                       adults with diabetes.      CBG: Recent Labs  Lab 12/09/24 0752 12/09/24 1137 12/09/24 1548 12/12/24 1209 12/13/24 0754  GLUCAP 143* 148* 169* 101* 103*     The patient is critically ill due to acute right temporal intraparenchymal hemorrhage with cerebral edema and brain compression.  Critical care was necessary to treat or prevent imminent or life-threatening deterioration.  Critical care was time spent personally by me on the following activities:  development of treatment plan with patient and/or surrogate as well as nursing, discussions with consultants, evaluation of patient's response to treatment, examination of patient, obtaining history from patient or surrogate, ordering and performing treatments and interventions, ordering and review of laboratory studies, ordering and review of radiographic studies, pulse oximetry, re-evaluation of patient's condition and participation in multidisciplinary rounds.   During this encounter critical care time was devoted to patient care services described in this note for 35 minutes.     Valinda Novas, MD [facility] See Amion for pager If no response to pager, please call [phone] until 7pm After 7pm, Please call E-link [phone]  "

## 2024-12-15 LAB — RESPIRATORY PANEL BY PCR

## 2024-12-15 LAB — URINALYSIS, W/ REFLEX TO CULTURE (INFECTION SUSPECTED)
Bilirubin Urine: NEGATIVE
Glucose, UA: NEGATIVE mg/dL
Hgb urine dipstick: NEGATIVE
Ketones, ur: NEGATIVE mg/dL
Leukocytes,Ua: NEGATIVE
Nitrite: NEGATIVE
Protein, ur: 30 mg/dL — AB
Specific Gravity, Urine: 1.023 (ref 1.005–1.030)
pH: 5 (ref 5.0–8.0)

## 2024-12-15 LAB — PHOSPHORUS: Phosphorus: 2.9 mg/dL (ref 2.5–4.6)

## 2024-12-15 LAB — CBC WITH DIFFERENTIAL/PLATELET
Abs Immature Granulocytes: 0.09 K/uL — ABNORMAL HIGH (ref 0.00–0.07)
Basophils Absolute: 0 K/uL (ref 0.0–0.1)
Basophils Relative: 0 %
Eosinophils Absolute: 0.2 K/uL (ref 0.0–0.5)
Eosinophils Relative: 1 %
HCT: 34.7 % — ABNORMAL LOW (ref 39.0–52.0)
Hemoglobin: 11.4 g/dL — ABNORMAL LOW (ref 13.0–17.0)
Immature Granulocytes: 1 %
Lymphocytes Relative: 16 %
Lymphs Abs: 2.4 K/uL (ref 0.7–4.0)
MCH: 30.1 pg (ref 26.0–34.0)
MCHC: 32.9 g/dL (ref 30.0–36.0)
MCV: 91.6 fL (ref 80.0–100.0)
Monocytes Absolute: 1.3 K/uL — ABNORMAL HIGH (ref 0.1–1.0)
Monocytes Relative: 8 %
Neutro Abs: 11.5 K/uL — ABNORMAL HIGH (ref 1.7–7.7)
Neutrophils Relative %: 74 %
Platelets: 154 K/uL (ref 150–400)
RBC: 3.79 MIL/uL — ABNORMAL LOW (ref 4.22–5.81)
RDW: 14.6 % (ref 11.5–15.5)
WBC: 15.5 K/uL — ABNORMAL HIGH (ref 4.0–10.5)
nRBC: 0 % (ref 0.0–0.2)

## 2024-12-15 LAB — BASIC METABOLIC PANEL WITH GFR
Anion gap: 9 (ref 5–15)
BUN: 33 mg/dL — ABNORMAL HIGH (ref 8–23)
CO2: 25 mmol/L (ref 22–32)
Calcium: 8.2 mg/dL — ABNORMAL LOW (ref 8.9–10.3)
Chloride: 125 mmol/L — ABNORMAL HIGH (ref 98–111)
Creatinine, Ser: 0.88 mg/dL (ref 0.61–1.24)
GFR, Estimated: >60 mL/min
Glucose, Bld: 134 mg/dL — ABNORMAL HIGH (ref 70–99)
Potassium: 3.6 mmol/L (ref 3.5–5.1)
Sodium: 158 mmol/L — ABNORMAL HIGH (ref 135–145)

## 2024-12-15 LAB — SODIUM
Sodium: 155 mmol/L — ABNORMAL HIGH (ref 135–145)
Sodium: 159 mmol/L — ABNORMAL HIGH (ref 135–145)

## 2024-12-15 LAB — MAGNESIUM: Magnesium: 2.4 mg/dL (ref 1.7–2.4)

## 2024-12-15 MED ORDER — POTASSIUM CHLORIDE 20 MEQ PO PACK
40.0000 meq | PACK | Freq: Once | ORAL | Status: AC
  Administered 2024-12-15: 40 meq
  Filled 2024-12-15: qty 2

## 2024-12-15 MED ORDER — HEPARIN SODIUM (PORCINE) 5000 UNIT/ML IJ SOLN
5000.0000 [IU] | Freq: Three times a day (TID) | INTRAMUSCULAR | Status: DC
  Administered 2024-12-15 – 2024-12-16 (×3): 5000 [IU] via SUBCUTANEOUS
  Filled 2024-12-15 (×3): qty 1

## 2024-12-15 MED ORDER — IPRATROPIUM-ALBUTEROL 0.5-2.5 (3) MG/3ML IN SOLN
3.0000 mL | RESPIRATORY_TRACT | Status: AC
  Administered 2024-12-15 – 2024-12-16 (×4): 3 mL via RESPIRATORY_TRACT
  Filled 2024-12-15 (×5): qty 3

## 2024-12-15 NOTE — Progress Notes (Signed)
 "  NAME:  Dwayne KUMPF, MRN:  [id number], DOB:  03-14-1946, LOS: 3 ADMISSION DATE:  12/12/2024, CONSULTATION DATE:  12/11/2024 REFERRING MD:  Inpatient rehab, CHIEF COMPLAINT:  Worsening  vasogenic edema    History of Present Illness:  Dwayne [name] is a 79 year old male with vertigo for hypertension, prior DVT, prior PE anticoagulated with Xarelto , retroperitoneal bleed, and arrhythmia who initially presented to the ED at [facility] 1/10 from independent living facility after suffering a fall with reported headache.  Given mechanical fall with anticoagulation patient presented as a level 2 trauma.  On EMS arrival patient was seen with hemiparesis weakness and mild AMS.  Head CT on arrival with evidence of large right temporal intraparenchymal bleed.  Neurosurgery was consulted and recommendation initially made for anticoagulation reversal and close observation in the ICU.  See below for pertinent Hospital events   Pertinent  Medical History  hypertension, prior DVT, prior PE anticoagulated with Xarelto , retroperitoneal bleed, and arrhythmia   Significant Hospital Events: Including procedures, antibiotic start and stop dates in addition to other pertinent events   1/10 presented after suffering mechanical fall on blood thinner workup revealed large IPH with shift.  Repeat head CT with worsening bleed and mass effect neurosurgery recommended continued observation and hypertonic saline. 1/12 MRI brain most consistent with large posterior temporal infarct with hemorrhagic conversion with secondary acute hemorrhag setting of fall 1/13 hypertonic saline stopped and neurosurgery signed off 1/14 patient admitted to inpatient rehab, hematology/oncology consulted for assistance in determining ongoing anticoagulation 1/17 patient was seen with increased lethargy and recurrent left hemiparesis prompting repeat head CT while in inpatient rehab which revealed persistent right temporal lobe hematoma with  surrounding vasogenic edema and increased right to left midline shift measuring up to 1.6 cm.  Neurosurgery contacted and recommendations made to move back to ICU for close observation and potential surgical intervention 1/18 agitated and restless, spiked fever with Tmax 102.7, complaining of headache 1/19 spiked fever again with Tmax 101.5, intermittently gets agitated, overall better  Interim History / Subjective:  No overnight issues Remained afebrile White count trended up from 9.4-15.5 Intermittently complaining of dysuria  Objective    Blood pressure (!) 141/99, pulse 73, temperature 98.9 F (37.2 C), temperature source Axillary, resp. rate (!) 24, weight 85 kg, SpO2 95%.        Intake/Output Summary (Last 24 hours) at 12/15/2024 0913 Last data filed at 12/15/2024 0700 Gross per 24 hour  Intake 2383.59 ml  Output 1800 ml  Net 583.59 ml   Filed Weights   12/13/24 0500 12/14/24 0500 12/15/24 0500  Weight: 84.5 kg 84.6 kg 85 kg    Examination: General: Acute on chronically ill-appearing elderly male, lying on the bed, in respiratory distress HEENT: [place]/AT, eyes anicteric.  moist mucus membranes. cortrak in place Neuro: Awake, following commands, plegic on left side with left facial droop Chest: Tachypneic, bilateral expiratory wheezes, no rhonchi or crackles Heart: Regular rate and rhythm, no murmurs or gallops Abdomen: Soft, nontender, nondistended, bowel sounds present  Labs reviewed  Patient Lines/Drains/Airways Status     Active Line/Drains/Airways     Name Placement date Placement time Site Days   PICC Double Lumen 12/06/24 Right Basilic 38 cm 0 cm 12/06/24  8389  -- 9   External Urinary Catheter 12/13/24  1026  --  2   Small Bore Feeding Tube 10 Fr. 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  Patient was medically managed with hypertonic saline during previous admission. On 1/17 patient with decreased mentation and left-sided weakness prompting repeat head CT which revealed worsening midline shift Neurosurgery is following, recommend no surgical intervention Neuroexam is stable Continue neuro watch Keep head of the bed elevated to 30 degrees Serum sodium is 158, will stop hypertonic saline Serum sodium is at goal between 150-155 Closely monitor serum sodium every 6 hours  Acute respiratory failure with hypoxia Patient was noted to be in respiratory distress this morning on exam he has bilateral wheezes Currently on 4 L nasal cannula oxygen Will send respiratory pathogen panel Continue nebs Continue IV ceftriaxone  in the meantime  History of prior DVT and PE Patient is on Xarelto  prior to admission, he was reversed with Kcentra  upon initial presentation  Now anticoagulation is on hold  Will start subcu heparin  for DVT prophylaxis  Not a candidate for therapeutic anticoagulation   Hypertension Patient blood pressure is controlled now Continue carvedilol  3.125 and amlodipine  10 mg daily Maintain SBP less than 160  Dysphagia due to acute stroke Continue tube feeds and dietary supplements  Thrombocytopenia due to critical illness Platelet counts improved, currently at 154 Watch for signs of bleeding   Labs   CBC: Recent Labs  Lab 12/10/24 0957 12/12/24 1210 12/13/24 0240 12/15/24 0514  WBC 9.9 9.9 9.4 15.5*   NEUTROABS 6.3 6.1  --  11.5*  HGB 11.9* 11.7* 11.5* 11.4*  HCT 35.9* 35.2* 34.1* 34.7*  MCV 90.4 88.0 88.6 91.6  PLT 103* 116* 116* 154    Basic Metabolic Panel: Recent Labs  Lab 12/09/24 0550 12/09/24 1401 12/11/24 0428 12/12/24 1210 12/12/24 1704 12/13/24 0240 12/13/24 0414 12/14/24 1625 12/14/24 2017 12/14/24 2201 12/14/24 2346 12/15/24 0514  NA 158*   < > 149* 141   < > 148*   < > 155* >170* 156* 155* 158*  K  --    < > 3.3* 4.0  --  4.2  --   --   --  3.7  --  3.6  CL  --    < > 114* 107  --  114*  --   --   --  122*  --  125*  CO2  --    < > 27 25  --  26  --   --   --  24  --  25  GLUCOSE  --    < > 115* 102*  --  117*  --   --   --  132*  --  134*  BUN  --    < > 35* 31*  --  29*  --   --   --  30*  --  33*  CREATININE  --    < > 0.85 0.75  --  0.76  --   --   --  0.88  --  0.88  CALCIUM  --    < >  7.8* 8.2*  --  7.6*  --   --   --  8.1*  --  8.2*  MG 1.9  --   --   --   --  2.1  --   --   --   --   --  2.4  PHOS 2.3*  --   --   --   --  3.4  --   --   --   --   --  2.9   < > = values in this interval not displayed.   GFR: Estimated Creatinine Clearance: 72.6 mL/min (by C-G formula based on SCr of 0.88 mg/dL). Recent Labs  Lab 12/10/24 0957 12/12/24 1210 12/13/24 0240 12/15/24 0514  WBC 9.9 9.9 9.4 15.5*    Liver Function Tests: Recent Labs  Lab 12/10/24 0957 12/12/24 1210  AST 41 35  ALT 35 29  ALKPHOS 48 47  BILITOT 0.7 0.6  PROT 5.4* 5.4*  ALBUMIN 3.1* 3.0*   No results for input(s): LIPASE, AMYLASE in the last 168 hours. Recent Labs  Lab 12/12/24 1704  AMMONIA 25    ABG    Component Value Date/Time   TCO2 27 12/05/2024 1024     Coagulation Profile: No results for input(s): INR, PROTIME in the last 168 hours.   Cardiac Enzymes: No results for input(s): CKTOTAL, CKMB, CKMBINDEX, TROPONINI in the last 168 hours.  HbA1C: Hgb A1c MFr Bld  Date/Time Value Ref Range Status  12/05/2024 07:38 PM 5.6 4.8 - 5.6 % Final     Comment:    (NOTE) Diagnosis of Diabetes The following HbA1c ranges recommended by the American Diabetes Association (ADA) may be used as an aid in the diagnosis of diabetes mellitus.  Hemoglobin             Suggested A1C NGSP%              Diagnosis  <5.7                   Non Diabetic  5.7-6.4                Pre-Diabetic  >6.4                   Diabetic  <7.0                   Glycemic control for                       adults with diabetes.      CBG: Recent Labs  Lab 12/09/24 0752 12/09/24 1137 12/09/24 1548 12/12/24 1209 12/13/24 0754  GLUCAP 143* 148* 169* 101* 103*     The patient is critically ill due to acute right temporal intraparenchymal hemorrhage with cerebral edema and brain compression.  Critical care was necessary to treat or prevent imminent or life-threatening deterioration.  Critical care was time spent personally by me on the following activities: development of treatment plan with patient and/or surrogate as well as nursing, discussions with consultants, evaluation of patient's response to treatment, examination of patient, obtaining history from patient or surrogate, ordering and performing treatments and interventions, ordering and review of laboratory studies, ordering and review of radiographic studies, pulse oximetry, re-evaluation of patient's condition and participation in multidisciplinary rounds.   During this encounter critical care time was devoted to patient care services described in this note for 34 minutes.     Valinda Novas, MD [facility] See Amion for pager If no response to pager, please call [phone] until 7pm After 7pm, Please call E-link [phone]  "

## 2024-12-15 NOTE — Progress Notes (Signed)
 Inpatient Rehab Admissions Coordinator:   Discussed with rehab team.  Pt with low tolerance for intensity of CIR when admitted but, given medical course, we will follow along for progress with therapy to see if he can tolerate OOB to chair and therapy sessions >30 minutes.  Note still with cortrak.  I will f/u on Thursday to see how he's doing.   Reche Lowers, PT, DPT Admissions Coordinator [phone] 12/15/24 10:30 AM

## 2024-12-15 NOTE — Progress Notes (Signed)
 SLP Cancellation Note  Patient Details Name: Dwayne [name] MRN: [id number] DOB: Sep 04, 1946   Cancelled treatment:       Reason Eval/Treat Not Completed: Medical issues which prohibited therapy. Per RN, would hold PO trials today. Pt decreased respiratory status today and family considering overall GOC. Will f/u as indicated.     Leita SAILOR., M.A. CCC-SLP Acute Rehabilitation Services Office: [phone]  Secure chat preferred  12/15/2024, 3:11 PM

## 2024-12-16 LAB — BASIC METABOLIC PANEL WITH GFR
Anion gap: 9 (ref 5–15)
BUN: 35 mg/dL — ABNORMAL HIGH (ref 8–23)
CO2: 25 mmol/L (ref 22–32)
Calcium: 7.7 mg/dL — ABNORMAL LOW (ref 8.9–10.3)
Chloride: 122 mmol/L — ABNORMAL HIGH (ref 98–111)
Creatinine, Ser: 0.8 mg/dL (ref 0.61–1.24)
GFR, Estimated: >60 mL/min
Glucose, Bld: 122 mg/dL — ABNORMAL HIGH (ref 70–99)
Potassium: 3.4 mmol/L — ABNORMAL LOW (ref 3.5–5.1)
Sodium: 157 mmol/L — ABNORMAL HIGH (ref 135–145)

## 2024-12-16 LAB — CBC WITH DIFFERENTIAL/PLATELET
Abs Immature Granulocytes: 0.07 K/uL (ref 0.00–0.07)
Basophils Absolute: 0.1 K/uL (ref 0.0–0.1)
Basophils Relative: 0 %
Eosinophils Absolute: 0.3 K/uL (ref 0.0–0.5)
Eosinophils Relative: 2 %
HCT: 34.4 % — ABNORMAL LOW (ref 39.0–52.0)
Hemoglobin: 11.1 g/dL — ABNORMAL LOW (ref 13.0–17.0)
Immature Granulocytes: 1 %
Lymphocytes Relative: 20 %
Lymphs Abs: 2.7 K/uL (ref 0.7–4.0)
MCH: 29.5 pg (ref 26.0–34.0)
MCHC: 32.3 g/dL (ref 30.0–36.0)
MCV: 91.5 fL (ref 80.0–100.0)
Monocytes Absolute: 1 K/uL (ref 0.1–1.0)
Monocytes Relative: 7 %
Neutro Abs: 9.7 K/uL — ABNORMAL HIGH (ref 1.7–7.7)
Neutrophils Relative %: 70 %
Platelets: 168 K/uL (ref 150–400)
RBC: 3.76 MIL/uL — ABNORMAL LOW (ref 4.22–5.81)
RDW: 14.8 % (ref 11.5–15.5)
WBC: 13.8 K/uL — ABNORMAL HIGH (ref 4.0–10.5)
nRBC: 0 % (ref 0.0–0.2)

## 2024-12-16 MED ORDER — ACETAMINOPHEN 650 MG RE SUPP: 650.0000 mg | Freq: Four times a day (QID) | RECTAL | Status: DC | PRN

## 2024-12-16 MED ORDER — MIDAZOLAM HCL (PF) 2 MG/2ML IJ SOLN: 2.0000 mg | INTRAMUSCULAR | Status: DC | PRN

## 2024-12-16 MED ORDER — LORAZEPAM 2 MG/ML IJ SOLN
1.0000 mg | INTRAMUSCULAR | Status: DC | PRN
  Administered 2024-12-17: 2 mg via INTRAVENOUS
  Filled 2024-12-16: qty 1

## 2024-12-16 MED ORDER — ORAL CARE MOUTH RINSE: 15.0000 mL | OROMUCOSAL | Status: DC | PRN

## 2024-12-16 MED ORDER — ORAL CARE MOUTH RINSE
15.0000 mL | OROMUCOSAL | Status: DC
  Administered 2024-12-16: 15 mL via OROMUCOSAL

## 2024-12-16 MED ORDER — GLYCOPYRROLATE 1 MG PO TABS: 1.0000 mg | ORAL_TABLET | ORAL | Status: DC | PRN

## 2024-12-16 MED ORDER — MORPHINE 100MG IN NS 100ML (1MG/ML) PREMIX INFUSION
5.0000 mg/h | INTRAVENOUS | Status: DC
  Administered 2024-12-16 – 2024-12-17 (×2): 5 mg/h via INTRAVENOUS
  Filled 2024-12-16 (×2): qty 100

## 2024-12-16 MED ORDER — POTASSIUM CHLORIDE 20 MEQ PO PACK
40.0000 meq | PACK | Freq: Once | ORAL | Status: AC
  Administered 2024-12-16: 40 meq
  Filled 2024-12-16: qty 2

## 2024-12-16 MED ORDER — GLYCOPYRROLATE 0.2 MG/ML IJ SOLN: 0.2000 mg | INTRAMUSCULAR | Status: DC | PRN

## 2024-12-16 MED ORDER — POLYVINYL ALCOHOL 1.4 % OP SOLN: 1.0000 [drp] | Freq: Four times a day (QID) | OPHTHALMIC | Status: DC | PRN

## 2024-12-16 MED ORDER — HALOPERIDOL LACTATE 5 MG/ML IJ SOLN: 2.0000 mg | Freq: Four times a day (QID) | INTRAMUSCULAR | Status: DC | PRN

## 2024-12-16 MED ORDER — MORPHINE BOLUS VIA INFUSION
5.0000 mg | INTRAVENOUS | Status: DC | PRN
  Administered 2024-12-16: 5 mg via INTRAVENOUS

## 2024-12-16 MED ORDER — GLYCOPYRROLATE 0.2 MG/ML IJ SOLN
0.2000 mg | INTRAMUSCULAR | Status: DC | PRN
  Administered 2024-12-17 (×2): 0.2 mg via INTRAVENOUS
  Filled 2024-12-16 (×2): qty 1

## 2024-12-16 MED ORDER — BETHANECHOL CHLORIDE 10 MG PO TABS
10.0000 mg | ORAL_TABLET | Freq: Three times a day (TID) | ORAL | Status: DC
  Administered 2024-12-16: 10 mg
  Filled 2024-12-16: qty 1

## 2024-12-16 MED ORDER — ACETAMINOPHEN 325 MG PO TABS: 650.0000 mg | ORAL_TABLET | Freq: Four times a day (QID) | ORAL | Status: DC | PRN

## 2024-12-16 MED ORDER — SODIUM CHLORIDE 0.9 % IV SOLN: INTRAVENOUS | Status: DC

## 2024-12-16 NOTE — Progress Notes (Addendum)
 Physical Therapy Treatment Patient Details Name: Dwayne [name] MRN: [id number] DOB: 20-May-1946 Today's Date: 12/16/2024   History of Present Illness Pt is a 79 y.o. male who presented 12/05/24 with headache and lethargy x24 hours, x2 falls, and progressive confusion and L-sided weakness. CT head showed large acute right temporal parenchymal hemorrhage with moderate edema and leftward midline shift, increasing in size on repeat imaging. 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 Transferred to [facility] 12/09/24 and returned to acute 12/12/24 after patient became lethargic and head CT showing increase in midline shift and left lateral ventricle dilation. PMH: DVT, PE on anticoagulation, HTN, retroperitoneal bleed, arrythmia    PT Comments  Pt seen this AM for PT. Pt unable to actively participate. Total assist for repositioning in bed. Gentle PROM LUE/LE. No command follow. No verbal interaction. Per MD note, plan is for transition to comfort care. PT to sign off at this time.     If plan is discharge home, recommend the following: Two people to help with walking and/or transfers;Two people to help with bathing/dressing/bathroom   Can travel by private vehicle     No  Equipment Recommendations  Other (comment) (TBD)    Recommendations for Other Services       Precautions / Restrictions Precautions Precautions: Fall;Other (comment) Recall of Precautions/Restrictions: Impaired Precaution/Restrictions Comments: L neglect, coretrak     Mobility  Bed Mobility Overal bed mobility: Needs Assistance Bed Mobility: Rolling Rolling: Total assist         General bed mobility comments: total assist for repositioning in bed    Transfers                   General transfer comment: unable    Ambulation/Gait                   Stairs             Wheelchair Mobility     Tilt Bed    Modified Rankin (Stroke Patients Only) Modified Rankin (Stroke Patients  Only) Pre-Morbid Rankin Score: No significant disability Modified Rankin: Severe disability     Balance                                            Communication    Cognition Arousal: Lethargic Behavior During Therapy: Flat affect, Restless   PT - Cognitive impairments: Difficult to assess Difficult to assess due to: Level of arousal                       Following commands: Impaired Following commands impaired:  (not following commands)    Cueing    Exercises      General Comments General comments (skin integrity, edema, etc.): Pt now on 4L O2      Pertinent Vitals/Pain Pain Assessment Pain Assessment: Faces Faces Pain Scale: No hurt    Home Living                          Prior Function            PT Goals (current goals can now be found in the care plan section) Acute Rehab PT Goals Patient Stated Goal: unable to state Progress towards PT goals: Not progressing toward goals - comment (med status decline)    Frequency  Min 2X/week      PT Plan      Co-evaluation              AM-PAC PT 6 Clicks Mobility   Outcome Measure  Help needed turning from your back to your side while in a flat bed without using bedrails?: Total Help needed moving from lying on your back to sitting on the side of a flat bed without using bedrails?: Total Help needed moving to and from a bed to a chair (including a wheelchair)?: Total Help needed standing up from a chair using your arms (e.g., wheelchair or bedside chair)?: Total Help needed to walk in hospital room?: Total Help needed climbing 3-5 steps with a railing? : Total 6 Click Score: 6    End of Session Equipment Utilized During Treatment: Oxygen Activity Tolerance: Patient limited by fatigue;Patient limited by lethargy Patient left: in bed;with bed alarm set;with call bell/phone within reach Nurse Communication: Mobility status PT Visit Diagnosis: Unsteadiness on  feet (R26.81);Other abnormalities of gait and mobility (R26.89);Muscle weakness (generalized) (M62.81);History of falling (Z91.81)     Time: 9089-9078 PT Time Calculation (min) (ACUTE ONLY): 11 min  Charges:    $Therapeutic Activity: 8-22 mins PT General Charges $$ ACUTE PT VISIT: 1 Visit                     Sari MATSU., PT  Office # [phone]    [name] 12/16/2024, 11:53 AM

## 2024-12-16 NOTE — Progress Notes (Signed)
 "  NAME:  Dwayne [name], MRN:  [id number], DOB:  08-08-1946, LOS: 4 ADMISSION DATE:  12/12/2024, CONSULTATION DATE:  12/11/2024 REFERRING MD:  Inpatient rehab, CHIEF COMPLAINT:  Worsening  vasogenic edema    History of Present Illness:  Dwayne [name] is a 79 year old male with vertigo for hypertension, prior DVT, prior PE anticoagulated with Xarelto , retroperitoneal bleed, and arrhythmia who initially presented to the ED at [facility] 1/10 from independent living facility after suffering a fall with reported headache.  Given mechanical fall with anticoagulation patient presented as a level 2 trauma.  On EMS arrival patient was seen with hemiparesis weakness and mild AMS.  Head CT on arrival with evidence of large right temporal intraparenchymal bleed.  Neurosurgery was consulted and recommendation initially made for anticoagulation reversal and close observation in the ICU.  See below for pertinent Hospital events   Pertinent  Medical History  hypertension, prior DVT, prior PE anticoagulated with Xarelto , retroperitoneal bleed, and arrhythmia   Significant Hospital Events: Including procedures, antibiotic start and stop dates in addition to other pertinent events   1/10 presented after suffering mechanical fall on blood thinner workup revealed large IPH with shift.  Repeat head CT with worsening bleed and mass effect neurosurgery recommended continued observation and hypertonic saline. 1/12 MRI brain most consistent with large posterior temporal infarct with hemorrhagic conversion with secondary acute hemorrhag setting of fall 1/13 hypertonic saline stopped and neurosurgery signed off 1/14 patient admitted to inpatient rehab, hematology/oncology consulted for assistance in determining ongoing anticoagulation 1/17 patient was seen with increased lethargy and recurrent left hemiparesis prompting repeat head CT while in inpatient rehab which revealed persistent right temporal lobe hematoma with  surrounding vasogenic edema and increased right to left midline shift measuring up to 1.6 cm.  Neurosurgery contacted and recommendations made to move back to ICU for close observation and potential surgical intervention 1/18 agitated and restless, spiked fever with Tmax 102.7, complaining of headache 1/19 spiked fever again with Tmax 101.5, intermittently gets agitated, overall better 1/20 patient remained afebrile, white count went up to 15.5, complaining of dysuria, improved after Foley insertion  Interim History / Subjective:  Remain afebrile Denies pain or any other complaints He is calm and restful  Objective    Blood pressure (!) 141/71, pulse 77, temperature 97.9 F (36.6 C), temperature source Axillary, resp. rate (!) 27, weight 81.8 kg, SpO2 95%.        Intake/Output Summary (Last 24 hours) at 12/16/2024 0849 Last data filed at 12/16/2024 9364 Gross per 24 hour  Intake 1280.17 ml  Output 1875 ml  Net -594.83 ml   Filed Weights   12/14/24 0500 12/15/24 0500 12/16/24 0500  Weight: 84.6 kg 85 kg 81.8 kg    Examination: General: Acute on chronically ill-appearing male, lying on the bed HEENT: [place]/AT, eyes anicteric.  moist mucus membranes. Cortrak in place Neuro: Awake, following commands on right side, withdrawing in both left upper and lower extremity with hemineglect Chest: Tachypneic, coarse breath sounds, no wheezes or rhonchi Heart: Regular rate and rhythm, no murmurs or gallops Abdomen: Soft, nontender, nondistended, bowel sounds present  Labs reviewed  Patient Lines/Drains/Airways Status     Active Line/Drains/Airways     Name Placement date Placement time Site Days   PICC Double Lumen 12/06/24 Right Basilic 38 cm 0 cm 12/06/24  8389  -- 10   Urethral Catheter Charletta Bathe, RN Latex 16 Fr. 12/15/24  1316  Latex  1   Small Bore Feeding Tube  10 Fr. 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  Patient was medically managed with hypertonic saline during previous admission. On 1/17 patient with decreased mentation and left-sided weakness prompting repeat head CT which revealed worsening midline shift And neuroexam remained stable, continue neurologic Evaluation of the bed elevated He is off hypertonic saline, serum sodium started trending down, currently at 157  Acute respiratory failure with hypoxia Patient breathing is better today, still requiring 4 L nasal cannula oxygen with O2 sat in low 90s Respiratory pathogen panel is negative Continue nebs Continue IV ceftriaxone  in the meantime  History of prior DVT and PE Patient is on Xarelto  prior to admission, he was reversed with Kcentra  upon initial presentation  Now anticoagulation is on hold  Subcu heparin  for DVT prophylaxis Not a candidate for therapeutic anticoagulation   Hypertension Patient blood pressure is controlled now Continue carvedilol  3.125 and amlodipine  10 mg daily Maintain SBP less than 160  Dysphagia due to acute stroke Continue tube feeds and dietary supplements  Goals of care discussion carried with family, patient's wife and daughter coming around 1130 this morning, they would not want aggressive measure and considering palliative care and comfort care options.   Labs   CBC: Recent Labs  Lab 12/10/24 0957 12/12/24 1210  12/13/24 0240 12/15/24 0514 12/16/24 0340  WBC 9.9 9.9 9.4 15.5* 13.8*  NEUTROABS 6.3 6.1  --  11.5* 9.7*  HGB 11.9* 11.7* 11.5* 11.4* 11.1*  HCT 35.9* 35.2* 34.1* 34.7* 34.4*  MCV 90.4 88.0 88.6 91.6 91.5  PLT 103* 116* 116* 154 168    Basic Metabolic Panel: Recent Labs  Lab 12/12/24 1210 12/12/24 1704 12/13/24 0240 12/13/24 0414 12/14/24 2201 12/14/24 2346 12/15/24 0514 12/15/24 0850 12/16/24 0340  NA 141   < > 148*   < > 156* 155* 158* 159* 157*  K 4.0  --  4.2  --  3.7  --  3.6  --  3.4*  CL 107  --  114*  --  122*  --  125*  --  122*  CO2 25  --  26  --  24  --  25  --  25  GLUCOSE 102*  --  117*  --  132*  --  134*  --  122*  BUN 31*  --  29*  --  30*  --  33*  --  35*  CREATININE 0.75  --  0.76  --  0.88  --  0.88  --  0.80  CALCIUM 8.2*  --  7.6*  --  8.1*  --  8.2*  --  7.7*  MG  --   --  2.1  --   --   --  2.4  --   --   PHOS  --   --  3.4  --   --   --  2.9  --   --    < > = values in this interval not displayed.   GFR: Estimated Creatinine Clearance: 79.8 mL/min (by C-G formula based on SCr of 0.8 mg/dL). Recent Labs  Lab 12/12/24 1210 12/13/24 0240 12/15/24 0514 12/16/24 0340  WBC 9.9 9.4 15.5* 13.8*    Liver Function Tests: Recent Labs  Lab 12/10/24 0957 12/12/24 1210  AST 41 35  ALT 35 29  ALKPHOS 48 47  BILITOT 0.7 0.6  PROT 5.4* 5.4*  ALBUMIN 3.1* 3.0*   No results for input(s): LIPASE, AMYLASE in the last 168 hours. Recent Labs  Lab 12/12/24 1704  AMMONIA 25    ABG    Component Value Date/Time   TCO2 27 12/05/2024 1024     Coagulation Profile: No results for input(s): INR, PROTIME in the last 168 hours.   Cardiac Enzymes: No results for input(s): CKTOTAL, CKMB, CKMBINDEX, TROPONINI in the last 168 hours.  HbA1C: Hgb A1c MFr Bld  Date/Time Value Ref Range Status  12/05/2024 07:38 PM 5.6 4.8 - 5.6 % Final    Comment:    (NOTE) Diagnosis of Diabetes The following HbA1c ranges recommended by the  American Diabetes Association (ADA) may be used as an aid in the diagnosis of diabetes mellitus.  Hemoglobin             Suggested A1C NGSP%              Diagnosis  <5.7                   Non Diabetic  5.7-6.4                Pre-Diabetic  >6.4                   Diabetic  <7.0                   Glycemic control for                       adults with diabetes.      CBG: Recent Labs  Lab 12/09/24 1137 12/09/24 1548 12/12/24 1209 12/13/24 0754  GLUCAP 148* 169* 101* 103*     The patient is critically ill due to acute right temporal intraparenchymal hemorrhage with cerebral edema and brain compression.  Critical care was necessary to treat or prevent imminent or life-threatening deterioration.  Critical care was time spent personally by me on the following activities: development of treatment plan with patient and/or surrogate as well as nursing, discussions with consultants, evaluation of patient's response to treatment, examination of patient, obtaining history from patient or surrogate, ordering and performing treatments and interventions, ordering and review of laboratory studies, ordering and review of radiographic studies, pulse oximetry, re-evaluation of patient's condition and participation in multidisciplinary rounds.   During this encounter critical care time was devoted to patient care services described in this note for 32 minutes.     Valinda Novas, MD [facility] See Amion for pager If no response to pager, please call [phone] until 7pm After 7pm, Please call E-link [phone] "

## 2024-12-16 NOTE — Plan of Care (Signed)
" °  Problem: Education: Goal: Knowledge of General Education information will improve Description: Including pain rating scale, medication(s)/side effects and non-pharmacologic comfort measures Outcome: Progressing   Problem: Clinical Measurements: Goal: Ability to maintain clinical measurements within normal limits will improve Outcome: Progressing Goal: Respiratory complications will improve Outcome: Progressing Goal: Cardiovascular complication will be avoided Outcome: Progressing   Problem: Activity: Goal: Risk for activity intolerance will decrease Outcome: Progressing   Problem: Nutrition: Goal: Adequate nutrition will be maintained Outcome: Progressing   Problem: Coping: Goal: Level of anxiety will decrease Outcome: Progressing   Problem: Elimination: Goal: Will not experience complications related to bowel motility Outcome: Progressing   Problem: Pain Managment: Goal: General experience of comfort will improve and/or be controlled Outcome: Progressing   Problem: Safety: Goal: Ability to remain free from injury will improve Outcome: Progressing   Problem: Skin Integrity: Goal: Risk for impaired skin integrity will decrease Outcome: Progressing   "

## 2024-12-16 NOTE — Progress Notes (Signed)
"                                                                                                                 Speech Therapy Discharge Note  This patient was unable to complete the inpatient rehab program due to change in medical status ; therefore, the patient did not meet their long term goals and has been discharged from skilled SLP services at this time.The patient left the program at a max to total assist level for overall cognitive functioning. The patient is currently consuming Dys1 textures with thin liquids with max assist for use of swallowing compensatory strategies.   See CareTool for functional status details.  If the patient is able to return to inpatient rehabilitation within 3 midnights, this may be considered an interrupted stay and therapy services will resume as ordered. Modification and reinstatement of their goals will be made upon completion of therapy service reevaluations.   "

## 2024-12-16 NOTE — Progress Notes (Signed)
 [facility] ADULT ICU REPLACEMENT PROTOCOL   The patient does apply for the [facility] Adult ICU Electrolyte Replacment Protocol based on the criteria listed below:   1.Exclusion criteria: TCTS, ECMO, Dialysis, and Myasthenia Gravis patients 2. Is GFR >/= 30 ml/min? Yes.    Patient's GFR today is >60 3. Is SCr </= 2? Yes.   Patient's SCr is 0.80 mg/dL 4. Did SCr increase >/= 0.5 in 24 hours? No. 5.Pt's weight >40kg  Yes.   6. Abnormal electrolyte(s): potassium 3.4  7. Electrolytes replaced per protocol 8.  Call MD STAT for K+ </= 2.5, Phos </= 1, or Mag </= 1 Physician:  protocol  Claretta JINNY Sharps 12/16/2024 4:51 AM

## 2024-12-16 NOTE — IPAL (Signed)
 Interdisciplinary Goals of Care Family Meeting   Date carried out:: 12/16/2024  Location of the meeting: Conference room  Member's involved: Physician, Bedside Registered Nurse, and Family Member or next of kin  Durable Power of Attorney or acting medical decision maker: Dwayne [name]    Discussion: We discussed goals of care for Dwayne [name] .    The Clinical status was relayed to Patient's wife, daughter and son in conference room in detail.   Updated and notified of patients medical condition.     Patient remains lethargic and minimally responsive.   Patient is having a weak cough and struggling to remove secretions.   Signs of brain damage    Patient with Progressive decline in mental status with basal ganglia hemorrhage with a very high probablity of a very minimal chance of meaningful recovery despite all aggressive and optimal medical therapy.  Code status: Full DNR  Disposition: In-patient comfort care    Family are satisfied with Plan of action and management. All questions answered   Dwayne Novas MD [facility] See Amion for pager If no response to pager, please call [phone] until 7pm After 7pm, Please call E-link [phone]

## 2024-12-16 NOTE — Consult Note (Signed)
 "                                                                                   Consultation Note Date: 12/16/2024   Patient Name: Dwayne [name]  DOB: 1946/04/06  MRN: [id number]  Age / Sex: 79 y.o., male  PCP: Charlott Dorn LABOR, MD Referring Physician: Harold Scholz, MD  Reason for Consultation: Terminal Care  HPI/Patient Profile: 79 y.o. 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   Clinical Assessment and Goals of Care: Consult received and chart review completed. I came to bedside but unfortunately wife and daughter have just left. I discussed with RN. The decision for comfort care has already been made. Dwayne [name] is on morphine  infusion. He was restless initially but was resting comfortably after morphine  bolus. Awaiting transfer out of ICU. Adjusted medications for comfort.   I called and spoke with daughter, [name]. I introduced myself to [name] and explained the role of palliative care. She confirms decision for comfort care as discussed earlier with Dr. Harold. We reviewed medications for comfort and my assessment. We reviewed plans for transition out of ICU to a regular room. We discussed the process for hospital death. [name] plans on coming and staying with her father during this time. I reassured her that my team will continue to ensure her father is comfortably cared for and support provided to her during this time. I shared that I will call and touch base with her stepmother as well.   I attempted to call [name] but no answer. I did not leave voicemail at this time.   All questions/concerns addressed. Emotional support provided.   Primary Decision Maker NEXT OF KIN wife and daughter together    SUMMARY OF RECOMMENDATIONS   - DNR - Full comfort care -  Anticipate hospital death  Code Status/Advance Care Planning: DNR   Symptom Management:  Medications adjusted to ensure comfort. Titrate frequency/dose as needed to achieve comfort.   Prognosis:  Hours - Days  Discharge Planning: Anticipated Hospital Death      Primary Diagnoses: Present on Admission:  Intracranial hypertension  Intraparenchymal hemorrhage of brain (HCC)   I have reviewed the medical record, interviewed the patient and family, and examined the patient. The following aspects are pertinent.  Past Medical History:  Diagnosis Date   Arrhythmia    Bilateral pulmonary embolism (HCC) 05/28/2016   03/03/16   DVT of lower limb, acute (HCC)    left popliteal tibial and peroneal vein   Hypertension    Retroperitoneal bleed 12/25/2016   Cough x 1 week on Xarelto  extensive abdominal wall hematoma tracking down to penis & scrotum   Social History   Socioeconomic History   Marital status: Married    Spouse name: Not on file   Number of children: Not on file   Years of education: Not on file   Highest education level: Not on file  Occupational History   Not on file  Tobacco Use   Smoking  status: Never   Smokeless tobacco: Never  Substance and Sexual Activity   Alcohol  use: Yes    Alcohol /week: 0.0 standard drinks of alcohol     Comment: rarely   Drug use: Never   Sexual activity: Not on file  Other Topics Concern   Not on file  Social History Narrative   Not on file   Social Drivers of Health   Tobacco Use: Low Risk (12/09/2024)   Patient History    Smoking Tobacco Use: Never    Smokeless Tobacco Use: Never    Passive Exposure: Not on file  Financial Resource Strain: Not on file  Food Insecurity: No Food Insecurity (12/05/2024)   Epic    Worried About Programme Researcher, Broadcasting/film/video in the Last Year: Never true    Ran Out of Food in the Last Year: Never true  Transportation Needs: No Transportation Needs (12/08/2024)   Epic    Lack of Transportation (Medical):  No    Lack of Transportation (Non-Medical): No  Physical Activity: Not on file  Stress: Not on file  Social Connections: Not on file  Depression (PHQ2-9): Low Risk (10/05/2024)   Depression (PHQ2-9)    PHQ-2 Score: 0  Alcohol  Screen: Not on file  Housing: Unknown (12/05/2024)   Epic    Unable to Pay for Housing in the Last Year: No    Number of Times Moved in the Last Year: Not on file    Homeless in the Last Year: No  Utilities: Not At Risk (12/08/2024)   Epic    Threatened with loss of utilities: No  Health Literacy: Not on file   Family History  Problem Relation Age of Onset   Stroke Father    Diabetes Father    Stroke Maternal Grandmother    Cancer Paternal Grandfather    Scheduled Meds:  Chlorhexidine  Gluconate Cloth  6 each Topical Daily   Continuous Infusions:  sodium chloride      morphine  5 mg/hr (12/16/24 1220)   PRN Meds:.acetaminophen  **OR** acetaminophen , artificial tears, glycopyrrolate  **OR** glycopyrrolate  **OR** glycopyrrolate , midazolam  PF, morphine  Allergies[1] Review of Systems  Unable to perform ROS: Acuity of condition    Physical Exam Vitals and nursing note reviewed.  Constitutional:      General: He is awake.     Appearance: He is ill-appearing.     Comments: Eyes open  Cardiovascular:     Rate and Rhythm: Normal rate.  Pulmonary:     Effort: No tachypnea, accessory muscle usage or respiratory distress.  Abdominal:     Palpations: Abdomen is soft.  Neurological:     Comments: Restless; does not follow commands     Vital Signs: BP 137/79   Pulse 73   Temp 97.9 F (36.6 C) (Axillary)   Resp (!) 25   Wt 81.8 kg   SpO2 (!) 87%   BMI 25.88 kg/m  Pain Scale: 0-10 POSS *See Group Information*: 2-Acceptable,Slightly drowsy, easily aroused Pain Score: 0-No pain   SpO2: SpO2: (!) 87 % O2 Device:SpO2: (!) 87 % O2 Flow Rate: .O2 Flow Rate (L/min): 4 L/min  IO: Intake/output summary:  Intake/Output Summary (Last 24 hours) at  12/16/2024 1243 Last data filed at 12/16/2024 1000 Gross per 24 hour  Intake 1200 ml  Output 1755 ml  Net -555 ml    LBM: Last BM Date : 12/15/24 Baseline Weight: Weight: 84.5 kg Most recent weight: Weight: 81.8 kg       Time Total: 55 min  Signed by: Bernarda Kitty, NP  Palliative Medicine Team Pager # [phone] (M-F 8a-5p) Team Phone # [phone] (Nights/Weekends)                     [1]  Allergies Allergen Reactions   Penicillins    Thimerosal (Thiomersal) Other (See Comments)   "

## 2024-12-16 NOTE — Progress Notes (Signed)
 SLP Cancellation Note  Patient Details Name: BILLAL ROLLO MRN: [id number] DOB: December 06, 1945   Cancelled treatment:       Reason Eval/Treat Not Completed: Other (comment) (Patient has transitioned to comfort care. SLP to s/o at this time.)  Norleen IVAR Blase, MA, CCC-SLP Speech Therapy  12/16/2024, 11:53 AM

## 2024-12-17 MED ORDER — MORPHINE BOLUS VIA INFUSION: 2.0000 mg | INTRAVENOUS | Status: DC | PRN

## 2024-12-17 NOTE — Hospital Course (Addendum)
 Dwayne [name] is a 79 year old male with vertigo for hypertension, prior DVT, prior PE anticoagulated with Xarelto , retroperitoneal bleed, and arrhythmia presented to hospital from independent living facility with fall which was thought to be mechanical.  Patient was on anticoagulation at home.  In the ED patient was noted to have large right temporal intraparenchymal hemorrhage.  Neurosurgery was consulted and patient was admitted to hospital.  Sequence of events  1/10 presented after suffering mechanical fall on blood thinner workup revealed large IPH with shift.  Repeat head CT with worsening bleed and mass effect neurosurgery recommended continued observation and hypertonic saline. 1/12 MRI brain most consistent with large posterior temporal infarct with hemorrhagic conversion with secondary acute hemorrhag setting of fall 1/13 hypertonic saline stopped and neurosurgery signed off 1/14 patient admitted to inpatient rehab, hematology/oncology consulted for assistance in determining ongoing anticoagulation 1/17 patient was seen with increased lethargy and recurrent left hemiparesis prompting repeat head CT while in inpatient rehab which revealed persistent right temporal lobe hematoma with surrounding vasogenic edema and increased right to left midline shift measuring up to 1.6 cm.  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  Patient was medically managed with hypertonic saline during previous admission. On 1/17 patient with decreased mentation and left-sided weakness  prompting repeat head CT which revealed worsening midline shift.  Off hypertonic saline.  Currently on comfort care    Acute respiratory failure with hypoxia Received nebs Rocephin  and supplemental care during hospitalization.  Currently on comfort care.   History of prior DVT and PE Patient is on Xarelto  prior to admission, he was reversed with Kcentra  upon initial presentation.  Currently has been transition to comfort care   Hypertension On comfort care   Dysphagia due to acute stroke On comfort care

## 2024-12-17 NOTE — Progress Notes (Signed)
 "    Urbano Albright, MD  Physician Physical Medicine and Rehabilitation   PMR Pre-admission    Signed   Date of Service: 12/08/2024 11:40 AM  Related encounter: ED to Hosp-Admission (Discharged) from 12/05/2024 in [facility]   Signed     Expand All Collapse All  PMR Admission Coordinator Pre-Admission Assessment   Patient: Dwayne [name] is an 79 y.o., male MRN: [id number] DOB: 01-05-1946 Height: 5' 10 (177.8 cm) Weight: 86 kg   Insurance Information HMO:     PPO:      PCP:      IPA:      80/20:      OTHER:  PRIMARY: Medicare Part A and B      Policy#: [id number]       Subscriber: pt CM Name:       Phone#:      Fax#:  Pre-Cert#: verified Health And Safety Inspector:  Benefits:  Phone #:      Name:  Eff. Date: 04/26/12 A, 06/26/22 B     Deduct: $1736      Out of Pocket Max: n/a      Life Max: n/a CIR: 100%      SNF: 20 full days (none used) Outpatient: 80%     Co-Pay: 20% Home Health: 100%      Co-Pay:  DME: 80%     Co-Pay: 20% Providers:  SECONDARYBETHA BRIGHTER      Policy#: [id number]     Phone#: [phone]   Financial Counselor:       Phone#:    The Data Collection Information Summary for patients in Inpatient Rehabilitation Facilities with attached Privacy Act Statement-Health Care Records was provided and verbally reviewed with: Patient and Family   Emergency Contact Information Contact Information       Name Relation Home Work Mobile    [place] Spouse [phone]             Other Contacts       Name Relation Home Work Mobile    [place] Daughter     [phone]           Current Medical History  Patient Admitting Diagnosis: ICH    History of Present Illness: Pt is a 79 y/o male with 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  In ED NIHSS 8, WBC 12.5, INR 1.5.  Code stroke was not called due to being outside the window for TNK/IR.   Imaging revealed mixed density lobar hemorrhage in his right posterior temporal lobe.  Neurosurgery consulted and felt likely ischemic insult with hemorrhagic conversion, no operative intervention recommended.  Neurology admitted pt, xarelto  was reversed.  Serial CTs showed increased ICH with increased MLS but stable clinical picture, neurosurgery continued to recommend medical management with hypertonic saline.  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  No plans for [facility] or IVC filter at this time.  Therapy evaluations completed and pt was recommended for CIR.    Complete NIHSS TOTAL: 17   Patient's medical record from [name] has been reviewed by the rehabilitation admission coordinator and physician.   Past Medical History      Past Medical History:  Diagnosis Date   Arrhythmia     Bilateral pulmonary embolism (HCC) 05/28/2016    03/03/16   DVT  of lower limb, acute (HCC)      left popliteal tibial and peroneal vein   Hypertension     Retroperitoneal bleed 12/25/2016    Cough x 1 week on Xarelto  extensive abdominal wall hematoma tracking down to penis & scrotum          Has the patient had major surgery during 100 days prior to admission? No   Family History   family history includes Cancer in his paternal grandfather; Diabetes in his father; Stroke in his father and maternal grandmother.   Current Medications [Current Medications]  [Current Medications]   Current Facility-Administered Medications:    acetaminophen  (TYLENOL ) tablet 650 mg, 650 mg, Oral, Q4H PRN, 650 mg at 12/09/24 1032 **OR** acetaminophen  (TYLENOL ) 160 MG/5ML solution 650 mg, 650 mg, Per Tube, Q4H PRN, 650 mg at 12/08/24 1238 **OR** acetaminophen  (TYLENOL ) suppository 650 mg, 650 mg, Rectal, Q4H PRN, Waddell Aquas A, NP, 650 mg at 12/06/24 2348   carvedilol  (COREG ) tablet 3.125 mg, 3.125 mg, Per Tube, BID WC, Shafer, Devon,  NP, 3.125 mg at 12/09/24 9178   Chlorhexidine  Gluconate Cloth 2 % PADS 6 each, 6 each, Topical, Q0600, Remi Pippin, NP, 6 each at 12/09/24 0547   enoxaparin  (LOVENOX ) injection 40 mg, 40 mg, Subcutaneous, Q24H, Sethi, Pramod S, MD, 40 mg at 12/08/24 2012   feeding supplement (OSMOLITE 1.5 CAL) liquid 1,000 mL, 1,000 mL, Per Tube, Continuous, Rosemarie Eather RAMAN, MD, Last Rate: 50 mL/hr at 12/09/24 1341, 1,000 mL at 12/09/24 1341   feeding supplement (PROSource TF20) liquid 60 mL, 60 mL, Per Tube, BID, Rosemarie, Pramod S, MD, 60 mL at 12/09/24 1033   hydrALAZINE  (APRESOLINE ) injection 10-20 mg, 10-20 mg, Intravenous, Q4H PRN, de Clint Kill, Cortney E, NP, 20 mg at 12/09/24 0409   labetalol  (NORMODYNE ) injection 10-20 mg, 10-20 mg, Intravenous, Q2H PRN, de Clint Kill, Cortney E, NP   multivitamin with minerals tablet 1 tablet, 1 tablet, Per Tube, Daily, Sethi, Pramod S, MD, 1 tablet at 12/09/24 1033   ondansetron  (ZOFRAN ) injection 4 mg, 4 mg, Intravenous, Q6H PRN, de Clint Kill, Cortney E, NP, 4 mg at 12/07/24 0907   Oral care mouth rinse, 15 mL, Mouth Rinse, 4 times per day, Remi Pippin, NP, 15 mL at 12/09/24 1240   Oral care mouth rinse, 15 mL, Mouth Rinse, PRN, Remi Pippin, NP   pantoprazole  (PROTONIX ) injection 40 mg, 40 mg, Intravenous, QHS, Waddell Aquas A, NP, 40 mg at 12/08/24 2012   senna-docusate (Senokot-S) tablet 1 tablet, 1 tablet, Per Tube, BID, Shafer, Pippin, NP, 1 tablet at 12/09/24 1033   sodium chloride  flush (NS) 0.9 % injection 10-40 mL, 10-40 mL, Intracatheter, Q12H, Shafer, Devon, NP, 10 mL at 12/09/24 1033   sodium chloride  flush (NS) 0.9 % injection 10-40 mL, 10-40 mL, Intracatheter, PRN, Remi Pippin, NP   thiamine  (VITAMIN B1) tablet 100 mg, 100 mg, Per Tube, Daily, Sethi, Pramod S, MD, 100 mg at 12/09/24 1033   Patients Current Diet:  Diet Order                  Diet full liquid Room service appropriate? No; Fluid consistency: Thin  Diet effective now                          Precautions / Restrictions Precautions Precautions: Fall Precaution/Restrictions Comments: L inattention, doesnt cross midline with tracking of eyes Restrictions Weight Bearing Restrictions Per Provider Order: No    Has the  patient had 2 or more falls or a fall with injury in the past year? Yes   Prior Activity Level Community (5-7x/wk): independent without DME, driving, very active   Prior Functional Level Self Care: Did the patient need help bathing, dressing, using the toilet or eating? Independent   Indoor Mobility: Did the patient need assistance with walking from room to room (with or without device)? Independent   Stairs: Did the patient need assistance with internal or external stairs (with or without device)? Independent   Functional Cognition: Did the patient need help planning regular tasks such as shopping or remembering to take medications? Independent   Patient Information Are you of Hispanic, Latino/a,or Spanish origin?: A. No, not of Hispanic, Latino/a, or Spanish origin What is your race?: A. White Do you need or want an interpreter to communicate with a doctor or health care staff?: 0. No   Patient's Response To:  Health Literacy and Transportation Is the patient able to respond to health literacy and transportation needs?: Yes Health Literacy - How often do you need to have someone help you when you read instructions, pamphlets, or other written material from your doctor or pharmacy?: Never In the past 12 months, has lack of transportation kept you from medical appointments or from getting medications?: No In the past 12 months, has lack of transportation kept you from meetings, work, or from getting things needed for daily living?: No   Home Assistive Devices / Equipment Home Equipment: [place] - single point   Prior Device Use: Indicate devices/aids used by the patient prior to current illness, exacerbation or injury? None of the above   Current  Functional Level Cognition   Arousal/Alertness: Awake/alert Overall Cognitive Status: Impaired/Different from baseline Orientation Level: Oriented X4 Attention: Sustained Sustained Attention: Impaired Sustained Attention Impairment: Functional basic Memory: Impaired Memory Impairment: Decreased recall of new information Awareness: Impaired Awareness Impairment: Intellectual impairment Problem Solving: Impaired Problem Solving Impairment: Functional basic    Extremity Assessment (includes Sensation/Coordination)   Upper Extremity Assessment: LUE deficits/detail LUE Deficits / Details: 1/5 MMT digit flexion/ext, 1/5 elbow flexion - otherwise not activation noted. no functional use of L. responds to pain LUE Sensation: decreased light touch, decreased proprioception LUE Coordination: decreased fine motor, decreased gross motor  Lower Extremity Assessment: Defer to PT evaluation LLE Deficits / Details: grossly 2+/5, able to stand with support of L knee blocking to prevent buckling     ADLs   Overall ADL's : Needs assistance/impaired Eating/Feeding: NPO Grooming: Maximal assistance Upper Body Bathing: Maximal assistance Lower Body Bathing: Total assistance Upper Body Dressing : Maximal assistance Lower Body Dressing: Total assistance Toilet Transfer: Total assistance Toileting- Clothing Manipulation and Hygiene: Total assistance Functional mobility during ADLs: Maximal assistance, +2 for safety/equipment, +2 for physical assistance General ADL Comments: limited by L weakness, R gaze, L inattention, poor balance, limited command following - no carryover of commands and no awareness of midline     Mobility   Overal bed mobility: Needs Assistance Bed Mobility: Rolling, Sidelying to Sit, Sit to Supine Rolling: Max assist, Used rails (to the L) Sidelying to sit: Max assist, +2 for physical assistance, HOB elevated, Used rails Sit to supine: Max assist, +2 for physical  assistance General bed mobility comments: pt with L neglect requiring max directional verbal and tactile cues, pt unable to initiate transfer, pt with strong resistance and pushing to the L with R UE this date     Transfers   Overall transfer level: Needs assistance Equipment  used: 2 person hand held assist (face to face transfer with bed pad) Transfers: Sit to/from Stand Sit to Stand: +2 physical assistance, Max assist General transfer comment: maxAx2 to power up, unable to achieve full upright posture/trunk extension, L knee blocked     Ambulation / Gait / Stairs / Wheelchair Mobility   Ambulation/Gait General Gait Details: unable this date     Posture / Balance Dynamic Sitting Balance Sitting balance - Comments: pushing with R UE to the L, L lateral lean Balance Overall balance assessment: Needs assistance Sitting-balance support: Feet supported, Single extremity supported Sitting balance-Leahy Scale: Poor Sitting balance - Comments: pushing with R UE to the L, L lateral lean Postural control: Left lateral lean Standing balance support: During functional activity, Reliant on assistive device for balance, No upper extremity supported Standing balance-Leahy Scale: Poor Standing balance comment: dependent on external assist     Special considerations/life events  N/A    Previous Home Environment (from acute therapy documentation) Living Arrangements: Spouse/significant other  Lives With: Spouse Available Help at Discharge: Family Type of Home: Independent living facility Home Layout: One level Home Access: Level entry Bathroom Shower/Tub: Health Visitor: Handicapped height Additional Comments: lives with wife in indep apartment at [facility]   Discharge Living Setting Plans for Discharge Living Setting: Lives with (comment) (spouse) Type of Home at Discharge: Independent living facility Care Facility Name at Discharge: [facility] Discharge Home Layout: One  level Discharge Home Access: Level entry Discharge Bathroom Shower/Tub: Walk-in shower Discharge Bathroom Toilet: Handicapped height Discharge Bathroom Accessibility: Yes How Accessible: Accessible via walker Does the patient have any problems obtaining your medications?: No   Social/Family/Support Systems Patient Roles: Spouse Anticipated Caregiver: [name] (spouse) Anticipated Caregiver's Contact Information: [phone] Ability/Limitations of Caregiver: min assist Caregiver Availability: 24/7 Discharge Plan Discussed with Primary Caregiver: Yes Is Caregiver In Agreement with Plan?: Yes Does Caregiver/Family have Issues with Lodging/Transportation while Pt is in Rehab?: No   Goals Patient/Family Goal for Rehab: PT/OT/SLP min assist Expected length of stay: 24-28 days Additional Information: Discharge plan: ideally will discharge home with spouse at min assist or better, but would be able to d/c to SNF at [facility] if needed Pt/Family Agrees to Admission and willing to participate: Yes Program Orientation Provided & Reviewed with Pt/Caregiver Including Roles  & Responsibilities: Yes   Decrease burden of Care through IP rehab admission: No.    Possible need for SNF placement upon discharge: Not anticipated, but potentially.  Plan for min assist goals and d/c back to ILF with spouse, but if pt does not reach a level she can manage would need to transition to SNF for further rehab.     Patient Condition: I have reviewed medical records from [facility], spoken with [facility] team, and patient, spouse, and daughter. I met with patient at the bedside for inpatient rehabilitation assessment.  Patient will benefit from ongoing PT, OT, and SLP, can actively participate in 3 hours of therapy a day 5 days of the week, and can make measurable gains during the admission.  Patient will also benefit from the coordinated team approach during an Inpatient Acute Rehabilitation admission.  The patient will  receive intensive therapy as well as Rehabilitation physician, nursing, social worker, and care management interventions.  Due to bladder management, bowel management, safety, skin/wound care, disease management, medication administration, pain management, and patient education the patient requires 24 hour a day rehabilitation nursing.  The patient is currently max +2 with mobility and basic ADLs.  Discharge  setting and therapy post discharge at [facility] is anticipated.  Patient has agreed to participate in the Acute Inpatient Rehabilitation Program and will admit today.   Preadmission Screen Completed By:  Reche FORBES Lowers, PT, DPT 12/09/2024 1:46 PM ______________________________________________________________________   Discussed status with Dr. Urbano on 12/09/2024  at 1:47 PM  and received approval for admission today.   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 Yes Co-Morbidities requiring supervision/potential complications: HTN, Dysphagia, DVT/PE history, fever, leukocytosis Due to bladder management, bowel management, safety, skin/wound care, disease management, medication administration, pain management, and patient education, does the patient require 24 hr/day rehab nursing? Yes Does the patient require coordinated care of a physician, rehab nurse, PT, OT, and SLP to address physical and functional deficits in the context of the above medical diagnosis(es)? Yes Addressing deficits in the following areas: balance, endurance, locomotion, strength, transferring, bowel/bladder control, bathing, dressing, feeding, grooming,  toileting, cognition, speech, language, swallowing, and psychosocial support Can the patient actively participate in an intensive therapy program of at least 3 hrs of therapy 5 days a week? Yes The potential for patient to make measurable gains while on inpatient rehab is excellent Anticipated functional outcomes upon discharge from inpatient rehab: min assist PT, min assist OT, min assist SLP Estimated rehab length of stay to reach the above functional goals is: 24-28 days Anticipated discharge destination: Home 10. Overall Rehab/Functional Prognosis: good     MD Signature: Murray Urbano           Revision History  Date/Time User Provider Type Action  12/09/2024  2:33 PM Urbano Murray, MD Physician Sign  12/09/2024  1:47 PM Lowers Reche FORBES, PT Rehab Admission Coordinator Share  12/08/2024 12:06 PM Lowers Reche FORBES, PT Rehab Admission Coordinator Share  12/08/2024 11:55 AM Lowers Reche FORBES, PT Rehab Admission Coordinator Share   View Details Report        "

## 2024-12-17 NOTE — Progress Notes (Signed)
 Inpatient Rehab Admissions Coordinator:   Note pt transitioned to comfort care on 1/21.  We will sign off for CIR at this time.    Reche Lowers, PT, DPT Admissions Coordinator [phone] 11/28/2024 11:35 AM

## 2024-12-17 NOTE — Progress Notes (Signed)
 Nutrition Brief Note  Chart reviewed. Pt now transitioning to comfort care.  No further nutrition interventions planned at this time.  Please re-consult as needed.   Cammy Copa., RD, LDN, CNSC See AMiON for contact information

## 2024-12-17 NOTE — Progress Notes (Signed)
 "                                                                                                                                                                                                         Daily Progress Note   Patient Name: Dwayne [name]       Date: 11/27/2024 DOB: 09-14-1946  Age: 79 y.o. MRN#: [id number] Attending Physician: Sonjia Held, MD Primary Care Physician: Charlott Dorn LABOR, MD Admit Date: 12/12/2024  Reason for Consultation/Follow-up: Non pain symptom management, Pain control, Psychosocial/spiritual support, and Terminal Care  Subjective: I have reviewed medical records including: EPIC notes: PMT, PT, SLP, CCM, nursing, TOC.  Patient was transition to full comfort care on 1/21. MAR: Comfort medications per MAR.  Currently on continuous morphine  infusion at 5 mg/h.  As needed medications administered in the last 24 hours-morphine  bolus x 1 at initiation of infusion. Available advanced directives in ACP: None Labs personally reviewed 1/22: Creatinine on 1/21 0.80 assessed for opioid prescribing  Received report from primary RN -no acute concerns.  Went to visit patient at bedside -no family/visitors present.  Patient was lying in bed unresponsive. No signs or non-verbal gestures of pain or discomfort noted. No respiratory distress or increased work of breathing; secretions noted.  Bilateral upper and lower extremities warm to touch.  Requested RN provide dose of Robinul  for secretions.  9:40 AM Attempted to call daughter/[name] to provide ongoing support - no answer - confidential voicemail left and PMT phone number provided with encouragement to return call if she had any questions or concerns.   Length of Stay: 5  Current Medications: Scheduled Meds:   Chlorhexidine  Gluconate Cloth  6 each Topical Daily    Continuous Infusions:  sodium chloride      morphine  5 mg/hr (11/29/2024 0349)    PRN Meds: acetaminophen  **OR** acetaminophen , artificial tears,  glycopyrrolate  **OR** glycopyrrolate  **OR** glycopyrrolate , haloperidol  lactate, LORazepam , morphine   Physical Exam Vitals and nursing note reviewed.  Constitutional:      General: He is not in acute distress.    Appearance: He is ill-appearing.  Pulmonary:     Effort: Pulmonary effort is normal. No respiratory distress.  Skin:    General: Skin is warm and dry.  Neurological:     Mental Status: He is unresponsive.             Vital Signs: BP 137/79   Pulse 68   Temp 97.9 F (36.6 C) (Axillary)   Resp 15   Wt 81.8 kg   SpO2 (!) 87%   BMI 25.88  kg/m  SpO2: SpO2: (!) 87 % O2 Device: O2 Device: Room Air O2 Flow Rate: O2 Flow Rate (L/min): 4 L/min  Intake/output summary:  Intake/Output Summary (Last 24 hours) at 12/08/2024 9247 Last data filed at 12/22/2024 9687 Gross per 24 hour  Intake 316.66 ml  Output 1250 ml  Net -933.34 ml   LBM: Last BM Date : 12/16/24 Baseline Weight: Weight: 84.5 kg Most recent weight: Weight: 81.8 kg       Palliative Assessment/Data: PPS 10%      Patient Active Problem List   Diagnosis Date Noted   Intracranial hypertension 12/12/2024   Intraparenchymal hemorrhage of brain (HCC) 12/12/2024   Traumatic subdural hematoma (HCC) 12/09/2024   Malnutrition of moderate degree 12/08/2024   ICH (intracerebral hemorrhage) (HCC) 12/05/2024   Retroperitoneal bleed 12/25/2016   Bilateral pulmonary embolism (HCC) 05/28/2016   DVT of lower limb, acute (HCC) 07/27/2015    Palliative Care Assessment & Plan   Patient Profile: 79 y.o. 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   Assessment: Principal Problem:   Intracranial hypertension Active Problems:   Intraparenchymal hemorrhage of brain (HCC)   Terminal  care  Recommendations/Plan: Continue full comfort measures Continue DNR/DNI as previously documented Anticipate hospital death Continue current comfort focused medication regimen as noted below - no changes Added orders to reflect full comfort measures, as well as discontinued orders that were not focused on comfort PMT will continue to follow and support holistically  Symptom Management Continuous morphine  infusion; bolus doses as needed for breakthrough pain/dyspnea/increased work of breathing/RR>25 Tylenol  PRN pain/fever Robinul  PRN secretions Haldol  PRN agitation/delirium Ativan  PRN anxiety/seizure/sleep/distress Liquifilm Tears PRN dry eye   Goals of Care and Additional Recommendations: Limitations on Scope of Treatment: Full Comfort Care  Code Status:    Code Status Orders  (From admission, onward)           Start     Ordered   12/16/24 1142  Do not attempt resuscitation (DNR) - Comfort care  Continuous       Question Answer Comment  If patient has no pulse and is not breathing Do Not Attempt Resuscitation   In Pre-Arrest Conditions (Patient Is Breathing and Has a Pulse) Provide comfort measures. Relieve any mechanical airway obstruction. Avoid transfer unless required for comfort.   Consent: Discussion documented in EHR or advanced directives reviewed      12/16/24 1142           Code Status History     Date Active Date Inactive Code Status Order ID Comments User Context   12/12/2024 1621 12/16/2024 1142 Limited: Do not attempt resuscitation (DNR) -DNR-LIMITED -Do Not Intubate/DNI  [id number]  Arloa Folks D, NP Inpatient   12/09/2024 1744 12/12/2024 1609 Full Code [id number]  Pegge Toribio PARAS, PA-C Inpatient   12/09/2024 1744 12/09/2024 1744 Full Code [id number]  Pegge Toribio PARAS, PA-C Inpatient   12/05/2024 1111 12/09/2024 1743 Full Code [id number]  Waddell Karna LABOR, NP ED       Prognosis:  Days   Discharge Planning: Anticipated Hospital  Death  Discussed case with: primary RN, Dr. Sonjia, [facility]  Thank you for allowing the Palliative Medicine Team to assist in the care of this patient.  Billing based on MDM: High  Problems Addressed: One acute or chronic illness or injury that poses a  threat to life or bodily function  Amount and/or Complexity of Data: Category 1:Review of prior external note(s) from each unique source, Review of the result(s) of each unique test, and Assessment requiring an independent historian(s) and Category 3:Discussion of management or test interpretation with external physician/other qualified health care professional/appropriate source (not separately reported)  Risks: Parenteral controlled substances      Madysen Faircloth CHRISTELLA Sharps, NP  Please contact Palliative Medicine Team phone at [phone] for questions and concerns.       "

## 2024-12-17 NOTE — Progress Notes (Signed)
 " PROGRESS NOTE  Dwayne [name] FMW:[id number] DOB: Jan 26, 1946 DOA: 12/12/2024 PCP: Charlott Dorn LABOR, MD   LOS: 5 days   Brief narrative:  Dwayne [name] is a 79 year old male with vertigo for hypertension, prior DVT, prior PE anticoagulated with Xarelto , retroperitoneal bleed, and arrhythmia presented to hospital from independent living facility with fall which was thought to be mechanical.  Patient was on anticoagulation at home.  In the ED patient was noted to have large right temporal intraparenchymal hemorrhage.  Neurosurgery was consulted and patient was admitted to hospital.  Sequence of events  1/10 presented after suffering mechanical fall on blood thinner workup revealed large IPH with shift.  Repeat head CT with worsening bleed and mass effect neurosurgery recommended continued observation and hypertonic saline. 1/12 MRI brain most consistent with large posterior temporal infarct with hemorrhagic conversion with secondary acute hemorrhag setting of fall 1/13 hypertonic saline stopped and neurosurgery signed off 1/14 patient admitted to inpatient rehab, hematology/oncology consulted for assistance in determining ongoing anticoagulation 1/17 patient was seen with increased lethargy and recurrent left hemiparesis prompting repeat head CT while in inpatient rehab which revealed persistent right temporal lobe hematoma with surrounding vasogenic edema and increased right to left midline shift measuring up to 1.6 cm.  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  Patient was medically managed with hypertonic saline during previous admission. On 1/17 patient with decreased mentation and left-sided weakness prompting repeat head CT which revealed worsening midline shift.  Off hypertonic saline.  Currently on comfort care with morphine  drip Ativan  Haldol .   Acute respiratory failure with hypoxia Received nebs Rocephin  and supplemental care during hospitalization.  Currently on comfort care.   History of prior DVT and PE Patient is on Xarelto  prior to admission, he was reversed with Kcentra  upon initial presentation.  Currently has been transition to comfort care   Hypertension On comfort care   Dysphagia due to acute stroke On comfort care  DVT prophylaxis: None for comfort   Disposition: Anticipate in-hospital death  Status is: Inpatient Remains inpatient appropriate because: Comfort care initiated    Code Status:     Code Status: Do not attempt resuscitation (DNR) - Comfort care  Family Communication: Spoke with the patient's daughter at bedside  Consultants: Critical care Palliative care   Procedures: None  Anti-infectives:  None currently  Anti-infectives (From admission, onward)    Start     Dose/Rate Route Frequency Ordered Stop   12/13/24 1330  cefTRIAXone  (ROCEPHIN ) 2 g in sodium chloride  0.9 % 100 mL IVPB  Status:  Discontinued        2 g 200 mL/hr over 30 Minutes Intravenous Every 24 hours 12/13/24 1231 12/16/24 1142        Subjective: Today, patient was seen and examined at bedside.  Appears to be comfortable, lethargic   Objective: Vitals:   12/25/2024 0600 12/16/2024 0700  BP:    Pulse: 69 68  Resp: 15 15  Temp:    SpO2: (!) 84% (!) 87%    Intake/Output Summary (Last  24 hours) at 12/16/2024 0935 Last data filed at 12/02/2024 9687 Gross per 24 hour  Intake 216.66 ml   Output 1250 ml  Net -1033.34 ml   Filed Weights   12/14/24 0500 12/15/24 0500 12/16/24 0500  Weight: 84.6 kg 85 kg 81.8 kg   Body mass index is 25.88 kg/m.   Physical Exam:   GENERAL: Patient is lethargic, agonal breathing, comfortable,  Data Review: I have personally reviewed the following laboratory data and studies,  CBC: Recent Labs  Lab 12/10/24 0957 12/12/24 1210 12/13/24 0240 12/15/24 0514 12/16/24 0340  WBC 9.9 9.9 9.4 15.5* 13.8*  NEUTROABS 6.3 6.1  --  11.5* 9.7*  HGB 11.9* 11.7* 11.5* 11.4* 11.1*  HCT 35.9* 35.2* 34.1* 34.7* 34.4*  MCV 90.4 88.0 88.6 91.6 91.5  PLT 103* 116* 116* 154 168   Basic Metabolic Panel: Recent Labs  Lab 12/12/24 1210 12/12/24 1704 12/13/24 0240 12/13/24 0414 12/14/24 2201 12/14/24 2346 12/15/24 0514 12/15/24 0850 12/16/24 0340  NA 141   < > 148*   < > 156* 155* 158* 159* 157*  K 4.0  --  4.2  --  3.7  --  3.6  --  3.4*  CL 107  --  114*  --  122*  --  125*  --  122*  CO2 25  --  26  --  24  --  25  --  25  GLUCOSE 102*  --  117*  --  132*  --  134*  --  122*  BUN 31*  --  29*  --  30*  --  33*  --  35*  CREATININE 0.75  --  0.76  --  0.88  --  0.88  --  0.80  CALCIUM 8.2*  --  7.6*  --  8.1*  --  8.2*  --  7.7*  MG  --   --  2.1  --   --   --  2.4  --   --   PHOS  --   --  3.4  --   --   --  2.9  --   --    < > = values in this interval not displayed.   Liver Function Tests: Recent Labs  Lab 12/10/24 0957 12/12/24 1210  AST 41 35  ALT 35 29  ALKPHOS 48 47  BILITOT 0.7 0.6  PROT 5.4* 5.4*  ALBUMIN 3.1* 3.0*   No results for input(s): LIPASE, AMYLASE in the last 168 hours. Recent Labs  Lab 12/12/24 1704  AMMONIA 25   Cardiac Enzymes: No results for input(s): CKTOTAL, CKMB, CKMBINDEX, TROPONINI in the last 168 hours. BNP (last 3 results) No results for input(s): BNP in the last 8760 hours.  ProBNP (last 3 results) No results for input(s): PROBNP in the last 8760 hours.  CBG: Recent  Labs  Lab 12/12/24 1209 12/13/24 0754  GLUCAP 101* 103*   Recent Results (from the past 240 hours)  Respiratory (~20 pathogens) panel by PCR     Status: None   Collection Time: 12/15/24 10:06 AM   Specimen: Nasopharyngeal Swab; Respiratory  Result Value Ref Range Status   Adenovirus NOT DETECTED NOT DETECTED Final   Coronavirus 229E NOT DETECTED NOT DETECTED Final    Comment: (NOTE) The Coronavirus on the Respiratory Panel, DOES NOT test for the novel  Coronavirus (2019 nCoV)    Coronavirus HKU1 NOT DETECTED NOT DETECTED Final   Coronavirus NL63 NOT DETECTED NOT DETECTED Final  Coronavirus OC43 NOT DETECTED NOT DETECTED Final   Metapneumovirus NOT DETECTED NOT DETECTED Final   Rhinovirus / Enterovirus NOT DETECTED NOT DETECTED Final   Influenza A NOT DETECTED NOT DETECTED Final   Influenza B NOT DETECTED NOT DETECTED Final   Parainfluenza Virus 1 NOT DETECTED NOT DETECTED Final   Parainfluenza Virus 2 NOT DETECTED NOT DETECTED Final   Parainfluenza Virus 3 NOT DETECTED NOT DETECTED Final   Parainfluenza Virus 4 NOT DETECTED NOT DETECTED Final   Respiratory Syncytial Virus NOT DETECTED NOT DETECTED Final   Bordetella pertussis NOT DETECTED NOT DETECTED Final   Bordetella Parapertussis NOT DETECTED NOT DETECTED Final   Chlamydophila pneumoniae NOT DETECTED NOT DETECTED Final   Mycoplasma pneumoniae NOT DETECTED NOT DETECTED Final    Comment: Performed at [facility], [address] [place], [place] [address]     Studies: No results found.    Vernal Alstrom, MD  [facility] 12/05/2024  If 7PM-7AM, please contact night-coverage             "

## 2024-12-17 NOTE — TOC Progression Note (Signed)
 Transition of Care [facility]) - Progression Note    Patient Details  Name: Dwayne [name] MRN: [id number] Date of Birth: May 30, 1946  [facility] CM/SW Contact  Inocente GORMAN Kindle, LCSW Phone Number: 12/05/2024, 10:18 AM  Clinical Narrative:    ICM remains available for needs as this patient transitions to comfort care.    Expected Discharge Plan: IP Rehab Facility Barriers to Discharge: Continued Medical Work up               Expected Discharge Plan and Services     Post Acute Care Choice: IP Rehab Living arrangements for the past 2 months: Apartment                                       Social Drivers of Health (SDOH) Interventions SDOH Screenings   Food Insecurity: Patient Unable To Answer (12/16/2024)  Housing: Unknown (12/16/2024)  Transportation Needs: Patient Unable To Answer (12/16/2024)  Utilities: Patient Unable To Answer (12/16/2024)  Depression (PHQ2-9): Low Risk (10/05/2024)  Social Connections: Patient Unable To Answer (12/16/2024)  Tobacco Use: Low Risk (12/09/2024)    Readmission Risk Interventions     No data to display

## 2024-12-27 NOTE — Progress Notes (Signed)
 Pt was observed showing no signs of breathing and no palpable pulse. Daughter is at bedside. Erminio Cone NP and Dr. Sonjia was notified. Charge nurse is aware. Pt TOD is 01-01-2025 at 2357.

## 2024-12-27 NOTE — Discharge Summary (Signed)
 "  DEATH SUMMARY   Patient Details  Name: Dwayne [name] MRN: [id number] DOB: Aug 27, 1946 ERE:[email or web address], Dorn LABOR, MD  Admission/Discharge Information   Admit Date:  January 08, 2025  Date of Death: Date of Death: 2025-01-13  Time of Death: Time of Death: 01/26/56  Length of Stay: 6   Principle Cause of death: Intracranial hypertension  Hospital Diagnoses: Principal Problem:   Intracranial hypertension Active Problems:   Intraparenchymal hemorrhage of brain [facility])   Hospital Course: Dwayne [name] is a 79 year old male with vertigo for hypertension, prior DVT, prior PE anticoagulated with Xarelto , retroperitoneal bleed, and arrhythmia presented to hospital from independent living facility with fall which was thought to be mechanical.  Patient was on anticoagulation at home.  In the ED patient was noted to have large right temporal intraparenchymal hemorrhage.  Neurosurgery was consulted and patient was admitted to hospital.  Sequence of events  1/10 presented after suffering mechanical fall on blood thinner workup revealed large IPH with shift.  Repeat head CT with worsening bleed and mass effect neurosurgery recommended continued observation and hypertonic saline. 1/12 MRI brain most consistent with large posterior temporal infarct with hemorrhagic conversion with secondary acute hemorrhag setting of fall 1/13 hypertonic saline stopped and neurosurgery signed off 1/14 patient admitted to inpatient rehab, hematology/oncology consulted for assistance in determining ongoing anticoagulation 2025-01-08 patient was seen with increased lethargy and recurrent left hemiparesis prompting repeat head CT while in inpatient rehab which revealed persistent right temporal lobe hematoma with surrounding vasogenic edema and increased right to left midline shift measuring up to 1.6 cm.  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  Patient was medically managed with hypertonic saline during previous admission. On January 08, 2025, patient with decreased mentation and left-sided weakness prompting repeat head CT which revealed worsening midline shift.  Off hypertonic saline.  Patient was subsequently transition to comfort care   Acute respiratory failure with hypoxia Received nebs, Rocephin  and supplemental care during hospitalization.  Currently on comfort care.   History of prior DVT and PE Patient is on Xarelto  prior to admission, he was reversed with Kcentra  upon initial presentation.  Subsequently was transitioned to comfort care   Hypertension On comfort care   Dysphagia due to acute stroke On comfort care  Procedures: None  Consultations: Critical care and palliative care  The results of significant diagnostics from this hospitalization (including imaging, microbiology, ancillary and laboratory) are listed below for reference.   Significant Diagnostic Studies: CT HEAD WO CONTRAST ( ) Result Date: 12/14/2024 EXAM: CT HEAD WITHOUT CONTRAST 12/14/2024 02:58:27 AM TECHNIQUE: CT of the head was performed without the administration of intravenous contrast. Automated exposure control, iterative reconstruction, and/or weight based adjustment of the mA/kV was utilized to reduce the radiation dose to as low as reasonably achievable. COMPARISON: 01-08-2025 CLINICAL HISTORY: Stroke, follow up Stroke, follow up Stroke, follow up Stroke, follow up Stroke, follow up FINDINGS: BRAIN AND VENTRICLES: Unchanged 6.5 x 3.4 cm right temporal intraparenchymal hematoma.  Surrounding edema. Unchanged 1.6 cm leftward midline shift. Possible superimposed right temporal infarct. Unchanged medialization of the right uncus. Worsened dilation of the left lateral ventricle. No extra-axial collection.  ORBITS: No acute abnormality. SINUSES: No acute abnormality. SOFT TISSUES AND SKULL: No acute soft tissue abnormality. No skull fracture. IMPRESSION: 1. Unchanged 6.5 x 3.4 cm right temporal intraparenchymal hematoma with surrounding edema and possible superimposed right temporal infarct 2. Unchanged mass effect including 1.6 cm leftward midline shift and medialization of the right uncus. 3. Worsened dilation of the left lateral ventricle. Electronically signed by: Franky Stanford MD 12/14/2024 03:09 AM EST RP Workstation: [id number]   DG CHEST PORT 1 VIEW Result Date: 12/13/2024 CLINICAL DATA:  Acute respiratory failure. EXAM: PORTABLE CHEST 1 VIEW COMPARISON:  Chest radiograph dated 12/12/2024. FINDINGS: Feeding tube extends below the diaphragm with tip beyond the margin of the image. There is cardiomegaly with vascular congestion and edema similar or slightly progressed. Trace bilateral pleural effusions suspected. No pneumothorax. No acute osseous pathology. IMPRESSION: Cardiomegaly with vascular congestion and edema similar or slightly progressed. Electronically Signed   By: Vanetta Chou M.D.   On: 12/13/2024 17:33   DG CHEST PORT 1 VIEW Result Date: 12/12/2024 EXAM: 1 VIEW XRAY OF THE CHEST 12/12/2024 09:11:00 AM COMPARISON: 12/09/2024 CLINICAL HISTORY: Tachypnea. FINDINGS: LINES, TUBES AND DEVICES: Right PICC terminates over the cavoatrial junction. Enteric tube enters stomach with tip not seen. LUNGS AND PLEURA: Small bilateral pleural effusions, unchanged. Hazy bibasilar lung opacities, unchanged. Mild cephalization of pulmonary vasculature without overt pulmonary edema. No pneumothorax. HEART AND MEDIASTINUM: Stable cardiomediastinal silhouette with mild cardiomegaly. BONES AND  SOFT TISSUES: No acute osseous abnormality. IMPRESSION: 1. Small bilateral pleural effusions, unchanged. 2. Mild cephalization of pulmonary vasculature without overt pulmonary edema. 3. Hazy bibasilar lung opacities, unchanged. 4. Stable cardiomediastinal silhouette with mild cardiomegaly. Electronically signed by: Lonni Necessary MD 12/12/2024 01:08 PM EST RP Workstation: [id number]   CT HEAD WO CONTRAST ( ) Result Date: 12/12/2024 EXAM: CT HEAD WITHOUT CONTRAST 12/12/2024 12:38:20 PM TECHNIQUE: CT of the head was performed without the administration of intravenous contrast. Automated exposure control, iterative reconstruction, and/or weight based adjustment of the mA/kV was utilized to reduce the radiation dose to as low as reasonably achievable. COMPARISON: 12/06/2024 CLINICAL HISTORY: Mental status change, unknown cause; S/p SDH, increased lethargy. FINDINGS: BRAIN AND VENTRICLES: Persistent right temporal lobe hematoma with surrounding vasogenic edema. Increased right-to-left midline shift, measuring up to 1.6 cm from previous 1.1 cm. Increased right lateral ventricle effacement. Increased left lateral ventricle dilation. No evidence of acute infarct. No extra-axial collection. ORBITS: No acute abnormality. SINUSES: No acute abnormality. SOFT TISSUES AND SKULL: No acute soft tissue abnormality. No skull fracture. IMPRESSION: 1. Persistent right temporal lobe hematoma with surrounding vasogenic edema. 2. Increased right-to-left midline shift, measuring up to 1.6 cm, previously 1.1 cm. 3. Increased right lateral ventricle effacement and increased left lateral ventricle dilation. Electronically signed by: Lonni Necessary MD 12/12/2024 12:53 PM EST RP Workstation: [id number]   DG Chest 2 View Result Date: 12/09/2024 EXAM: 2 VIEW(S) XRAY OF THE CHEST 12/09/2024 07:26:37 PM COMPARISON: 12/05/2024 CLINICAL HISTORY: Fever FINDINGS: LINES, TUBES AND DEVICES: Enteric tube courses below diaphragm with tip  out of field of view. Right PICC line terminates in region of superior cavoatrial junction. LUNGS AND PLEURA: Small bilateral pleural effusions. Mild bibasilar heterogeneous airspace opacities. No pneumothorax. HEART AND MEDIASTINUM: No acute abnormality of the cardiac and mediastinal silhouettes. BONES AND SOFT TISSUES: No acute osseous abnormality. IMPRESSION: 1. Mild bibasilar heterogeneous airspace opacities. 2. Small bilateral pleural effusions. Electronically signed by: Morgane Naveau MD 12/09/2024 07:36 PM EST RP Workstation: [id number]   DG Swallowing Func-Speech Pathology Result Date: 12/09/2024 Table  formatting from the original result was not included. Modified Barium Swallow Study Patient Details Name: Dwayne [name] MRN: [id number] Date of Birth: September 08, 1946 Today's Date: 12/09/2024 HPI/PMH: HPI: 79 yo male presenting to ED 1/10 with headache and lethargy x24 hours as well as progressive confusion and L sided weakness s/p falls x2. CTH showed large acute R temporal parenchymal hemorrhage with moderate edema and leftward midline shift, increasing in size on repeat imaging. 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 CT Cervical Spine shows moderate multilevel cervical disc degeneration and convex curvature of the lower cervical spine. Pt initially passed the [facility] 1/10 but SLP was consulted 1/11 due to change in mentation. PMH includes DVT, PE on anticoagulation, HTN, retroperitoneal bleed, arrhythmia Clinical Impression: Pt exhibits mild oral dysphagia secondary to cognitive deficits. This impacts labial seal with limited awareness of anterior loss. Trace oral residuals are noted along his tongue and palate. The pharyngeal phase is overall functional despite chronic anterior curvature of the cervical spine (see CT 1/10) but epiglottic inversion and laryngeal elevation are complete. No penetration/aspiration occurs with all consistencies regardless of volume and rate.  He masticated the 13 mm barium tablet instead of swallowing it whole but with noted esophageal retention. Given fluctuating alertness and cognitive impairment, will start with full liquids. Give meds whole with puree. Full supervision to ensure he is positioned upright and fully alert will be necessary. Expect good prognosis to advance quickly with ongoing intervention. Factors that may increase risk of adverse event in presence of aspiration Noe & Lianne 2021): Factors that may increase risk of adverse event in presence of aspiration Noe & Lianne 2021): Reduced cognitive function; Limited mobility; Frail or deconditioned Recommendations/Plan: Swallowing Evaluation Recommendations Swallowing Evaluation Recommendations Recommendations: PO diet PO Diet Recommendation: Full liquid diet Liquid Administration via: Spoon; Cup; Straw Medication Administration: Whole meds with puree Supervision: Full assist for feeding; Full supervision/cueing for swallowing strategies Swallowing strategies  : Minimize environmental distractions; Slow rate; Small bites/sips Postural changes: Position pt fully upright for meals Oral care recommendations: Oral care BID (2x/day) Treatment Plan Treatment Plan Treatment recommendations: Therapy as outlined in treatment plan below Follow-up recommendations: Acute inpatient rehab (3 hours/day) Functional status assessment: Patient has had a recent decline in their functional status and demonstrates the ability to make significant improvements in function in a reasonable and predictable amount of time. Treatment frequency: Min 2x/week Treatment duration: 2 weeks Interventions: Aspiration precaution training; Compensatory techniques; Patient/family education; Trials of upgraded texture/liquids Recommendations Recommendations for follow up therapy are one component of a multi-disciplinary discharge planning process, led by the attending physician.  Recommendations may be updated based on  patient status, additional functional criteria and insurance authorization. Assessment: Orofacial Exam: Orofacial Exam Oral Cavity: Oral Hygiene: WFL Oral Cavity - Dentition: Adequate natural dentition Orofacial Anatomy: WFL Oral Motor/Sensory Function: WFL Anatomy: Anatomy: Suspected cervical osteophytes Boluses Administered: Boluses Administered Boluses Administered: Thin liquids (Level 0); Mildly thick liquids (Level 2, nectar thick); Moderately thick liquids (Level 3, honey thick); Puree; Solid  Oral Impairment Domain: Oral Impairment Domain Lip Closure: Escape beyond mid-chin Tongue control during bolus hold: Cohesive bolus between tongue to palatal seal Bolus preparation/mastication: Timely and efficient chewing and mashing Bolus transport/lingual motion: Brisk tongue motion Oral residue: Trace residue lining oral structures Location of oral residue : Tongue; Palate Initiation of pharyngeal swallow : Pyriform sinuses  Pharyngeal Impairment Domain: Pharyngeal Impairment Domain Soft palate elevation: No bolus between soft palate (SP)/pharyngeal wall (PW)  Laryngeal elevation: Complete superior movement of thyroid cartilage with complete approximation of arytenoids to epiglottic petiole Anterior hyoid excursion: Complete anterior movement Epiglottic movement: Complete inversion Laryngeal vestibule closure: Complete, no air/contrast in laryngeal vestibule Pharyngeal stripping wave : Present - complete Pharyngeal contraction (A/P view only): N/A Pharyngoesophageal segment opening: Partial distention/partial duration, partial obstruction of flow Tongue base retraction: Trace column of contrast or air between tongue base and PPW Pharyngeal residue: Trace residue within or on pharyngeal structures Location of pharyngeal residue: Tongue base; Valleculae  Esophageal Impairment Domain: Esophageal Impairment Domain Esophageal clearance upright position: Complete clearance, esophageal coating Pill: Pill Consistency  administered: Thin liquids (Level 0) Thin liquids (Level 0): [facility] Penetration/Aspiration Scale Score: Penetration/Aspiration Scale Score 1.  Material does not enter airway: Thin liquids (Level 0); Mildly thick liquids (Level 2, nectar thick); Moderately thick liquids (Level 3, honey thick); Puree; Solid; Pill Compensatory Strategies: Compensatory Strategies Compensatory strategies: No   General Information: Caregiver present: No  Diet Prior to this Study: NPO; Cortrak/Small bore NG tube   Temperature : Normal   Respiratory Status: Tachypneic   Supplemental O2: Nasal cannula   History of Recent Intubation: No  Behavior/Cognition: Alert; Requires cueing; Lethargic/Drowsy; Cooperative; Confused Self-Feeding Abilities: Needs assist with self-feeding Baseline vocal quality/speech: Normal Volitional Cough: Able to elicit Volitional Swallow: Able to elicit Exam Limitations: No limitations Goal Planning: Prognosis for improved oropharyngeal function: Good Barriers to Reach Goals: Cognitive deficits No data recorded Patient/Family Stated Goal: none stated Consulted and agree with results and recommendations: Patient Pain: Pain Assessment Pain Assessment: Faces Faces Pain Scale: 0 Pain Location: neck, restless sitting EOB reporting Its just uncomfortable Pain Intervention(s): Monitored during session End of Session: Start Time:SLP Start Time (ACUTE ONLY): 1140 Stop Time: SLP Stop Time (ACUTE ONLY): 1200 Time Calculation:SLP Time Calculation (min) (ACUTE ONLY): 20 min Charges: SLP Evaluations $ SLP Speech Visit: 1 Visit SLP Evaluations $MBS Swallow: 1 Procedure $Swallowing Treatment: 1 Procedure $Speech Treatment for Individual: 1 Procedure SLP visit diagnosis: SLP Visit Diagnosis: Dysphagia, oral phase (R13.11) Past Medical History: Past Medical History: Diagnosis Date  Arrhythmia   Bilateral pulmonary embolism (HCC) 05/28/2016  03/03/16  DVT of lower limb, acute (HCC)   left popliteal tibial and peroneal vein  Hypertension    Retroperitoneal bleed 12/25/2016  Cough x 1 week on Xarelto  extensive abdominal wall hematoma tracking down to penis & scrotum Past Surgical History: Past Surgical History: Procedure Laterality Date  INGUINAL HERNIA REPAIR Left 01/22/2022  Procedure: OPEN LEFT INGUINAL HERNIA REPAIR WITH MESH;  Surgeon: Vernetta Berg, MD;  Location: [facility];  Service: General;  Laterality: Left;  INGUINAL HERNIA REPAIR Right 01/20/2024  Procedure: open right HERNIA REPAIR INGUINAL ADULT;  Surgeon: Vernetta Berg, MD;  Location: [facility];  Service: General;  Laterality: Right;  LMA  KNEE SURGERY   Damien Blumenthal, M.A., CCC-SLP Speech Language Pathology, Acute Rehabilitation Services Secure Chat preferred [phone] 12/09/2024, 1:04 PM  MR BRAIN W WO CONTRAST Result Date: 12/07/2024 EXAM: MRI BRAIN WITH AND WITHOUT CONTRAST 12/07/2024 04:58:00 AM TECHNIQUE: Multiplanar multisequence MRI of the head/brain was performed with and without the administration of intravenous contrast. COMPARISON: Head CT yesterday and earlier. CLINICAL HISTORY: 79 year old male. Stroke, hemorrhagic. FINDINGS: BRAIN AND VENTRICLES: No acute infarct. Redemonstrated large right hemispheric hemorrhage with layering hematocrit level. By MRI and including some of the layering seroma the intra axial hematoma encompasses 82 x 59 x 44 mm (AP x transverse x CC). Estimated volume: 100 mL. Internal blood products range  from isointense to hyperintense on T1 imaging, extremely hypointense to hyperintense on T2 imaging. No convincing enhancement is identified within the hemorrhage. Confluent edema surrounding the hemorrhage which partially tracks into the right deep white matter capsules. The right basal ganglia and thalami appear negative except for mass effect. Only trace intraventricular extension of blood such as layering in the left occipital horn. Small right side subdural hematoma is confirmed measuring 3 mm (series 11 image  27) and also likely accounts for the appearance of mild asymmetry right sided pachymeningeal thickening and enhancement. Intracranial mass effect with subtotal effacement of the right lateral ventricle and leftward midline shift of 9 mm (series 11 image 30). No ventriculomegaly or transependymal edema. Patchy nonspecific periventricular and other widely scattered white matter T2 and FLAIR hyperintensity. On SWI, there are occasional areas of possible chronic microhemorrhage and superficial siderosis (right parietal lobe series 12 image 52, left operculum series 12 image 42). There is confirmed a small right lateral temporal lobe probably dural based semicircular and homogeneously enhancing mass measuring 10 x 5 x 14 mm, most resembling a small meningioma. Susceptibility artifact on diffusion weighted imaging associated with the blood products. No larger area of restricted diffusion. Basilar cisterns remain patent. Following contrast the major dural venous sinuses are enhancing and patent. No other abnormal intra axial enhancement. ORBITS: No acute abnormality. SINUSES: Trace left mastoid effusion. BONES AND SOFT TISSUES: Negative visible cervical spine. Normal bone marrow signal and enhancement. No acute soft tissue abnormality. IMPRESSION: 1. Large right hemisphere hemorrhage (estimated 100 mL by MRI) with surrounding confluent edema. At this time no evidence of associated tumor (see #3) but recommend repeat MRI without and with contrast once the hematoma has mostly resolved (e.g. in 3 months). Several areas of bilateral superficial siderosis - Amyloid angiopathy is not excluded. 2. Small right subdural hematoma (3 mm) and trace intraventricular hemorrhage. Intracranial mass effect with leftward midline shift of 9 mm. 3. 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 4. Moderate for age nonspecific white matter signal changes, most commonly due to chronic small vessel disease  Electronically signed by: Helayne Hurst MD MD 12/07/2024 05:18 AM EST RP Workstation: [id number]   ECHOCARDIOGRAM COMPLETE Result Date: 12/06/2024    ECHOCARDIOGRAM REPORT   Patient Name:   Dwayne [name] Date of Exam: 12/06/2024 Medical Rec #:  [id number]       Height:       70.0 in Accession #:    [id number]      Weight:       166.0 lb Date of Birth:  [birth year]        BSA:          1.928 m Patient Age:    78 years        BP:           143/67 mmHg Patient Gender: M               HR:           65 bpm. Exam Location:  Inpatient Procedure: 2D Echo, Cardiac Doppler and Color Doppler (Both Spectral and Color            Flow Doppler were utilized during procedure). Indications:    Stroke  History:        Patient has no prior history of Echocardiogram examinations.  Sonographer:    Sherlean Dubin Referring Phys: [id number] ZEKE HERO [facility] IMPRESSIONS  1. Left ventricular ejection fraction, by estimation,  is 60 to 65%. Left ventricular ejection fraction by 2D MOD biplane is 63.0 %. The left ventricle has normal function. The left ventricle has no regional wall motion abnormalities. Left ventricular diastolic parameters were normal.  2. Right ventricular systolic function is normal. The right ventricular size is normal.  3. The mitral valve is normal in structure. No evidence of mitral valve regurgitation. No evidence of mitral stenosis.  4. The aortic valve is normal in structure. Aortic valve regurgitation is mild. Aortic valve sclerosis/calcification is present, without any evidence of aortic stenosis.  5. There is mild dilatation of the ascending aorta, measuring 41 mm.  6. The inferior vena cava is dilated in size with <50% respiratory variability, suggesting right atrial pressure of 15 mmHg. Comparison(s): No prior Echocardiogram. FINDINGS  Left Ventricle: Left ventricular ejection fraction, by estimation, is 60 to 65%. Left ventricular ejection fraction by 2D MOD biplane is 63.0 %. The left ventricle has normal  function. The left ventricle has no regional wall motion abnormalities. The left ventricular internal cavity size was normal in size. There is no left ventricular hypertrophy. Left ventricular diastolic parameters were normal. Right Ventricle: The right ventricular size is normal. No increase in right ventricular wall thickness. Right ventricular systolic function is normal. Left Atrium: Left atrial size was normal in size. Right Atrium: Right atrial size was normal in size. Pericardium: There is no evidence of pericardial effusion. Mitral Valve: The mitral valve is normal in structure. No evidence of mitral valve regurgitation. No evidence of mitral valve stenosis. Tricuspid Valve: The tricuspid valve is normal in structure. Tricuspid valve regurgitation is not demonstrated. No evidence of tricuspid stenosis. Aortic Valve: The aortic valve is normal in structure. Aortic valve regurgitation is mild. Aortic valve sclerosis/calcification is present, without any evidence of aortic stenosis. Aortic valve mean gradient measures 7.5 mmHg. Aortic valve peak gradient measures 14.3 mmHg. Aortic valve area, by VTI measures 2.33 cm. Pulmonic Valve: The pulmonic valve was normal in structure. Pulmonic valve regurgitation is not visualized. No evidence of pulmonic stenosis. Aorta: The aortic root is normal in size and structure. There is mild dilatation of the ascending aorta, measuring 41 mm. Venous: The inferior vena cava is dilated in size with less than 50% respiratory variability, suggesting right atrial pressure of 15 mmHg. IAS/Shunts: The interatrial septum was not well visualized.  LEFT VENTRICLE PLAX 2D                        Biplane EF (MOD) LVIDd:         5.00 cm         LV Biplane EF:   Left LVIDs:         3.20 cm                          ventricular LV PW:         0.90 cm                          ejection LV IVS:        0.80 cm                          fraction by LVOT diam:     2.20 cm  2D  MOD LV SV:         102                              biplane is LV SV Index:   53                               63.0 %. LVOT Area:     3.80 cm                                Diastology                                LV e' medial:    9.90 cm/s LV Volumes (MOD)               LV E/e' medial:  10.9 LV vol d, MOD    50.9 ml       LV e' lateral:   12.10 cm/s A2C:                           LV E/e' lateral: 8.9 LV vol d, MOD    91.0 ml A4C: LV vol s, MOD    23.5 ml A2C: LV vol s, MOD    25.7 ml A4C: LV SV MOD A2C:   27.4 ml LV SV MOD A4C:   91.0 ml LV SV MOD BP:    46.8 ml RIGHT VENTRICLE             IVC RV Basal diam:  3.70 cm     IVC diam: 2.40 cm RV Mid diam:    3.00 cm RV S prime:     14.60 cm/s TAPSE (M-mode): 1.6 cm LEFT ATRIUM             Index        RIGHT ATRIUM           Index LA diam:        2.90 cm 1.50 cm/m   RA Area:     19.70 cm LA Vol (A2C):   63.5 ml 32.93 ml/m  RA Volume:   55.20 ml  28.62 ml/m LA Vol (A4C):   28.0 ml 14.52 ml/m LA Biplane Vol: 44.9 ml 23.28 ml/m  AORTIC VALVE AV Area (Vmax):    2.24 cm AV Area (Vmean):   2.26 cm AV Area (VTI):     2.33 cm AV Vmax:           189.00 cm/s AV Vmean:          126.500 cm/s AV VTI:            0.437 m AV Peak Grad:      14.3 mmHg AV Mean Grad:      7.5 mmHg LVOT Vmax:         111.50 cm/s LVOT Vmean:        75.200 cm/s LVOT VTI:          0.268 m LVOT/AV VTI ratio: 0.61  AORTA Ao Root diam: 3.40 cm Ao Asc diam:  4.10 cm MITRAL VALVE                TRICUSPID VALVE MV Area (PHT): 3.65  cm     TR Peak grad:   12.5 mmHg MV Decel Time: 208 msec     TR Vmax:        177.00 cm/s MV E velocity: 108.00 cm/s MV A velocity: 107.00 cm/s  SHUNTS MV E/A ratio:  1.01         Systemic VTI:  0.27 m                             Systemic Diam: 2.20 cm Joelle Cedars Tonleu Electronically signed by Joelle Cedars Tonleu Signature Date/Time: 12/06/2024/3:11:01 PM    Final    US  EKG SITE RITE Result Date: 12/06/2024 If Site Rite image not attached, placement could not be  confirmed due to current cardiac rhythm.  CT HEAD WO CONTRAST ( ) Result Date: 12/06/2024 EXAM: CT HEAD WITHOUT CONTRAST 12/06/2024 10:34:22 AM TECHNIQUE: CT of the head was performed without the administration of intravenous contrast. Automated exposure control, iterative reconstruction, and/or weight based adjustment of the mA/kV was utilized to reduce the radiation dose to as low as reasonably achievable. COMPARISON: CT head and CTA head and neck from yesterday. CLINICAL HISTORY: 79 year old male. Presented with large right hemisphere hemorrhage yesterday. FINDINGS: BRAIN AND VENTRICLES: Large hyperdense and mixed density hemorrhage in the right hemisphere with layering hematocrit level now encompasses approximately 79 x 57 x 40 mm (AP x transverse x CC). Estimated volume: 94 mL. Allowing for some redistribution, this does not appear significantly changed in size or configuration since 2145 hours yesterday. Intracranial mass effect with leftward midline shift of 8 mm and subtotal effacement of the right lateral ventricle also stable. On axial images, the midline shift is estimated at 11 mm and stable. No ventriculomegaly. No convincing intraventricular hemorrhage. There is trace extra-axial hyperdense blood along the right operculum (series 4, image 21), or could be artifact from draining cortical vein there, but regardless is stable since presentation. Edema in the right hemisphere is stable. There is questionable ill-defined enlargement of the right caudate and anterior basal ganglia superimposed, along the anteromedial margin of the hemorrhage and edema (such as series 4, image 18). Basilar cisterns remain patent. No evidence of acute infarct. Hemorrhagic tumor is not excluded. Brain MRI without and with contrast recommended when feasible. Calcified atherosclerosis at the skull base. No suspicious intracranial vascular hyperdensity. ORBITS: No acute abnormality. SINUSES: Paranasal sinuses, tympanic  cavities and mastoids remain well aerated. SOFT TISSUES AND SKULL: No acute soft tissue abnormality. No skull fracture. IMPRESSION: 1. Large right hemispheric parenchymal hemorrhage (estimated volume 94 mL), stable from 2145 hours yesterday along with edema, mass effect, and leftward midline shift (11 mm on axials). 2. Possible abnormal expansion of the Right caudate/basal ganglia along the medial margin. Hemorrhagic tumor not excluded, MRI without and with contrast recommended when feasible. 3. Questionable trace extra-axial blood along the right operculum, stable. Electronically signed by: Helayne Hurst MD MD 12/06/2024 10:46 AM EST RP Workstation: [id number]   VAS US  LOWER EXTREMITY VENOUS (DVT) Result Date: 12/06/2024  Lower Venous DVT Study Patient Name:  Dwayne [name]  Date of Exam:   12/05/2024 Medical Rec #: [id number]        Accession #:    [id number] Date of Birth: 01-30-1946         Patient Gender: M Patient Age:   56 years Exam Location:  [facility] Procedure:      VAS US  LOWER EXTREMITY VENOUS (DVT) Referring Phys: ZEKE RADDLE --------------------------------------------------------------------------------  Indications: Status post multiple falls resulting in head trauma. Anticoagulation halted.  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 Anticoagulation: Xarelto . Limitations: Altered mental status, constant movement of right leg. Comparison Study: Prior left LEV done 03/03/16 at [facility] indicating                   extensive DVT througout left lower extremity. Performing Technologist: Alberta Lis RVS  Examination Guidelines: A complete evaluation includes B-mode imaging, spectral Doppler, color Doppler, and power Doppler as needed of all accessible portions of each vessel. Bilateral testing is considered an integral part of a complete examination. Limited examinations for reoccurring indications may be performed as noted. The reflux portion of the  exam is performed with the patient in reverse Trendelenburg.  +---------+---------------+---------+-----------+----------+-------------------+ RIGHT    CompressibilityPhasicitySpontaneityPropertiesThrombus Aging      +---------+---------------+---------+-----------+----------+-------------------+ CFV      Full           Yes      No                                       +---------+---------------+---------+-----------+----------+-------------------+ SFJ      Full                                                             +---------+---------------+---------+-----------+----------+-------------------+ FV Prox  Full           Yes      No                                       +---------+---------------+---------+-----------+----------+-------------------+ FV Mid   Full                                                             +---------+---------------+---------+-----------+----------+-------------------+ FV DistalFull                                                             +---------+---------------+---------+-----------+----------+-------------------+ PFV      Full           Yes      No                                       +---------+---------------+---------+-----------+----------+-------------------+ POP      Full           Yes      No                                       +---------+---------------+---------+-----------+----------+-------------------+ PTV  Full                                                             +---------+---------------+---------+-----------+----------+-------------------+ PERO                                                  Not well visualized +---------+---------------+---------+-----------+----------+-------------------+   +---------+---------------+---------+-----------+----------+--------------+ LEFT     CompressibilityPhasicitySpontaneityPropertiesThrombus Aging  +---------+---------------+---------+-----------+----------+--------------+ CFV      Full           Yes      No                                  +---------+---------------+---------+-----------+----------+--------------+ SFJ      Full                                                        +---------+---------------+---------+-----------+----------+--------------+ FV Prox  Partial                                      Chronic        +---------+---------------+---------+-----------+----------+--------------+ FV Mid   Partial        Yes      No                   Chronic        +---------+---------------+---------+-----------+----------+--------------+ FV DistalFull                                                        +---------+---------------+---------+-----------+----------+--------------+ PFV      Full                                                        +---------+---------------+---------+-----------+----------+--------------+ POP      Full           Yes      Yes                                 +---------+---------------+---------+-----------+----------+--------------+ PTV      Full                                                        +---------+---------------+---------+-----------+----------+--------------+ PERO     Full                                                        +---------+---------------+---------+-----------+----------+--------------+  Gastroc  Full                                                        +---------+---------------+---------+-----------+----------+--------------+     Summary: RIGHT: - There is no evidence of deep vein thrombosis in the lower extremity.  - No cystic structure found in the popliteal fossa.  LEFT: - Findings consistent with chronic deep vein thrombosis involving the left femoral vein.  - No cystic structure found in the popliteal fossa.  *See table(s) above for measurements and observations.  Electronically signed by Fonda Rim on 12/06/2024 at 8:21:48 AM.    Final    CT HEAD WO CONTRAST ( ) Result Date: 12/05/2024 EXAM: CT HEAD WITHOUT CONTRAST 12/05/2024 09:46:29 PM TECHNIQUE: CT of the head was performed without the administration of intravenous contrast. Automated exposure control, iterative reconstruction, and/or weight based adjustment of the mA/kV was utilized to reduce the radiation dose to as low as reasonably achievable. COMPARISON: 09/27/2025 at 4:21 pm. CLINICAL HISTORY: Stroke, follow up. FINDINGS: BRAIN AND VENTRICLES: Intraparenchymal hematoma of the right hemisphere is increased in size, measuring 6.4 x 5.5 cm on axial images as opposed to 6.0 x 5.0 cm on the prior examination. Leftward midline shift measures 11 mm, previously 7 mm. Mass effect on the right lateral ventricle is unchanged. The temporal horn of the right lateral ventricle is slightly more dilated, possibly indicating early entrapment. No evidence of acute infarct. No extra-axial collection. ORBITS: No acute abnormality. SINUSES: No acute abnormality. SOFT TISSUES AND SKULL: No acute soft tissue abnormality. No skull fracture. IMPRESSION: 1. Increased size of the right hemisphere intraparenchymal hematoma, now measuring 6.4 x 5.5 cm (previously 6.0 x 5.0 cm). 2. Leftward midline shift increased to 11 mm (previously 7 mm). 3. Unchanged mass effect on the right lateral ventricle with slight dilation of the temporal horn, possibly indicating early entrapment. Electronically signed by: Franky Stanford MD MD 12/05/2024 10:01 PM EST RP Workstation: [id number]   CT HEAD WO CONTRAST ( ) Result Date: 12/05/2024 EXAM: CT HEAD WITHOUT CONTRAST 12/05/2024 04:39:14 PM TECHNIQUE: CT of the head was performed without the administration of intravenous contrast. Automated exposure control, iterative reconstruction, and/or weight based adjustment of the mA/kV was utilized to reduce the radiation dose to as low as reasonably  achievable. COMPARISON: 12/05/2024 CLINICAL HISTORY: Stroke, follow up FINDINGS: BRAIN AND VENTRICLES: Large acute parenchymal hemorrhage in right temporal lobe with surrounding edema. Hemorrhage measures 7.1 x 4.0 x 5.6 cm, previously 7.5 x 4.3 x 4.9 cm when remeasured similarly. The current volume is 82.688 cm\S\3. Leftward midline shift of 7 mm with partial effacement of right lateral ventricle, similar to prior. Stable 1-2 mm thick subdural hematoma along the left frontal convexity. No evidence of ventricular entrapment involving the left lateral ventricle. No evidence of acute infarct. No hydrocephalus. ORBITS: No acute abnormality. SINUSES: Nondepressed bilateral nasal bone fractures. SOFT TISSUES AND SKULL: Mild soft tissue swelling along left frontal scalp. No skull fracture. IMPRESSION: 1. Large acute parenchymal hemorrhage in the right temporal lobe with surrounding edema, measuring 7.1 x 4.0 x 5.6 cm (volume approximately 83 cc), previously 7.5 x 4.3 x 4.9 cm (volume approximately 82 cc). 2. Stable 1-2 mm thick subdural hematoma along the left frontal convexity, 3. Leftward midline shift of 7 mm with partial effacement of the right lateral ventricle, similar  to prior. 4. Stable effacement of the right lateral ventricle without ventricular entrapment. 5. Nondepressed bilateral nasal bone fractures. Electronically signed by: Dorethia Molt MD MD 12/05/2024 05:20 PM EST RP Workstation: [id number]   CT ANGIO HEAD NECK W WO CM Addendum Date: 12/05/2024 There is a 9 x 4 mm focus of homogeneous enhancement over the lateral right temporal convexity (series 5 image 111) which is of indeterminate etiology but favored to be extra-axial and may represent a small mass such as a meningioma or possibly cortical tumor. A brain MRI without and with contrast is recommended to further evaluate this finding, as well as to assess for the possibility of a mass underlying the large right temporal hemorrhage.  ---------------------------------------------------- Electronically signed by: Dasie Hamburg MD MD 12/05/2024 11:50 AM EST RP Workstation: [id number]   Result Date: 12/05/2024  EXAM: CTA HEAD AND NECK WITH AND WITHOUT 12/05/2024 10:56:06 AM TECHNIQUE: CTA of the head and neck was performed with and without the administration of 75 mL of intravenous iohexol  (OMNIPAQUE ) 350 MG/ML injection. Multiplanar 2D and/or 3D reformatted images are provided for review. Automated exposure control, iterative reconstruction, and/or weight based adjustment of the mA/kV was utilized to reduce the radiation dose to as low as reasonably achievable. Stenosis of the internal carotid arteries measured using NASCET criteria. COMPARISON: None available CLINICAL HISTORY: Neuro deficit, acute, stroke suspected. FINDINGS: CTA NECK: AORTIC ARCH AND ARCH VESSELS: The aortic arch was incompletely imaged, including exclusion of the origins of the brachiocephalic and left common carotid arteries and partial exclusion of the left subclavian artery origin. The visualized portions of the brachiocephalic and subclavian arteries appear widely patent, with streak artifact from venous contrast obscuring portions of the brachiocephalic and right subclavian arteries. CERVICAL CAROTID ARTERIES: Mild to moderate atherosclerotic calcification in the carotid bulbs. 40% stenosis of the left ICA origin. No significant stenosis on the right. No dissection or arterial injury. CERVICAL VERTEBRAL ARTERIES: Codominant vertebral arteries. No dissection, arterial injury, or significant stenosis. LUNGS AND MEDIASTINUM: Unremarkable. SOFT TISSUES: No acute abnormality. BONES: Cervical spine reported separately. CTA HEAD: ANTERIOR CIRCULATION: The intracranial internal carotid arteries are widely patent. ACAs and MCAs are patent without evidence of a proximal branch occlusion or significant M1 or right A1 stenosis. The right ACA is dominant with the left being diffusely  diminutive. Right MCA branch vessels are mildly to moderately attenuated diffusely compared to the left with mass effect upon them by the large right temporal lobe hemorrhage. A 3 mm focus of enhancement in the posterior aspect of the hemorrhage does not clearly connect with a vessel and is suggestive of a spot sign (series 5 image 86). No underlying aneurysm or vascular malformation is identified. POSTERIOR CIRCULATION: The intracranial vertebral arteries are widely patent to the basilar. Patent PICA and SCA origins are visualized bilaterally. The basilar artery is patent and congenitally small without evidence of a significant focal stenosis. There are large posterior communicating arteries and hypoplastic P1 segments bilaterally. Both PCAs are patent without evidence of a flow limiting proximal stenosis. The right temporal lobe hemorrhage results in medial deviation of the right PCA, and there is mild narrowing of the distal right P2 segment. No aneurysm. OTHER: No dural venous sinus thrombosis on this non-dedicated study. Emergent results were communicated by telephone to Dr. Doretha on 12/05/2024 at 11:05 AM. IMPRESSION: 1. Large right temporal lobe hemorrhage with mass effect on the right PCA and right MCA branch vessels. A 3 mm focus of enhancement in the posterior aspect of the  hemorrhage is suggestive of a spot sign which increases the risk of further hematoma expansion. No underlying aneurysm or vascular malformation is identified. 2. 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 Electronically signed by: Dasie Hamburg MD MD 12/05/2024 11:47 AM EST RP Workstation: [id number]   CT Maxillofacial Wo Contrast Result Date: 12/05/2024 EXAM: CT OF THE FACE WITHOUT CONTRAST 12/05/2024 10:53:08 AM TECHNIQUE: CT of the face was performed without the administration of intravenous contrast. Multiplanar reformatted images are provided for review. Automated exposure control, iterative  reconstruction, and/or weight based adjustment of the mA/kV was utilized to reduce the radiation dose to as low as reasonably achievable. COMPARISON: CT maxillofacial 10/08/2020. CLINICAL HISTORY: Facial trauma, blunt. FINDINGS: FACIAL BONES: Acute, at most minimally displaced bilateral nasal bone fractures. Old fractures of the left maxillary sinus and left orbit. Asymmetrically advanced left TMJ arthropathy. No mandibular dislocation. No suspicious bone lesion. ORBITS: Old fractures of the left orbit. Bilateral cataract extraction. No inflammatory change. SINUSES AND MASTOIDS: The paranasal sinuses, mastoid air cells, and middle ear cavities are clear. Leftward nasal septal deviation. SOFT TISSUES: Nasal and left sided facial soft tissue swelling. IMPRESSION: 1. Acute, at most minimally displaced bilateral nasal bone fractures. 2. Nasal and left sided facial soft tissue swelling. Electronically signed by: Dasie Hamburg MD MD 12/05/2024 11:26 AM EST RP Workstation: [id number]   CT CERVICAL SPINE WO CONTRAST Result Date: 12/05/2024 EXAM: CT CERVICAL SPINE WITHOUT CONTRAST 12/05/2024 10:53:08 AM TECHNIQUE: CT of the cervical spine was performed without the administration of intravenous contrast. Multiplanar reformatted images are provided for review. Automated exposure control, iterative reconstruction, and/or weight based adjustment of the mA/kV was utilized to reduce the radiation dose to as low as reasonably achievable. COMPARISON: CT cervical spine 10/08/2020. CLINICAL HISTORY: Polytrauma, blunt. FINDINGS: BONES AND ALIGNMENT: Chronic trace anterolisthesis of C7 on T1 and T1 on T2 and trace retrolisthesis of C3 on C4. Mild left convex curvature of the lower cervical spine. No acute fracture or suspicious lesion. DEGENERATIVE CHANGES: Moderate multilevel cervical disc degeneration. Asymmetrically advanced right sided facet arthrosis at multiple levels. Moderate to severe multilevel neural foraminal stenosis. SOFT  TISSUES: No prevertebral soft tissue swelling. IMPRESSION: 1. No acute cervical spine fracture or traumatic malalignment. Electronically signed by: Dasie Hamburg MD MD 12/05/2024 11:21 AM EST RP Workstation: [id number]   CT HEAD WO CONTRAST Result Date: 12/05/2024 EXAM: CT HEAD WITHOUT CONTRAST 12/05/2024 10:53:08 AM TECHNIQUE: CT of the head was performed without the administration of intravenous contrast. Automated exposure control, iterative reconstruction, and/or weight based adjustment of the mA/kV was utilized to reduce the radiation dose to as low as reasonably achievable. COMPARISON: Head CT 10/08/2020. CLINICAL HISTORY: Head trauma, moderate-severe. FINDINGS: BRAIN AND VENTRICLES: Large acute parenchymal hemorrhage centered in the posterior right temporal lobe measures 8.0 x 5.0 x 3.7 cm (AP x transverse x craniocaudal) (estimated volume of 74 ml). There is moderate surrounding vasogenic edema with mass effect including regional sulcal effacement, effacement of the right lateral and third ventricles, and 6 mm of leftward midline shift. There is mild mass effect on the midbrain and partial effacement of the upper basilar cisterns. No hydrocephalus, contralateral infarct, or definite extra-axial fluid collection is identified. Calcified atherosclerosis at the skull base. ORBITS: Reported on today's separate maxillofacial CT. SINUSES: Reported on today's separate maxillofacial CT. SOFT TISSUES AND SKULL: Left frontal scalp soft tissue swelling. No skull fracture. Critical results were communicated by telephone to Dr. Doretha on 12/05/2024 at 11:05 AM. IMPRESSION: 1. Large  acute right temporal parenchymal hemorrhage with moderate edema and 6 mm leftward midline shift. 2. Left frontal scalp soft tissue swelling. Electronically signed by: Dasie Hamburg MD MD 12/05/2024 11:17 AM EST RP Workstation: [id number]   DG Chest Port 1 View Result Date: 12/05/2024 CLINICAL DATA:  Blunt chest trauma.  On anticoagulation.  EXAM: PORTABLE CHEST 1 VIEW COMPARISON:  12/20/2016 FINDINGS: The lung apices are not visualized on this exam. Given this limitation, no pneumothorax or pleural effusion are visualized. Both lungs are clear. Heart size and mediastinal contours are within normal limits. IMPRESSION: Lung apices not included on this exam. No acute findings identified. Electronically Signed   By: Norleen DELENA Kil M.D.   On: 12/05/2024 11:00    Microbiology: Recent Results (from the past 240 hours)  Respiratory (~20 pathogens) panel by PCR     Status: None   Collection Time: 12/15/24 10:06 AM   Specimen: Nasopharyngeal Swab; Respiratory  Result Value Ref Range Status   Adenovirus NOT DETECTED NOT DETECTED Final   Coronavirus 229E NOT DETECTED NOT DETECTED Final    Comment: (NOTE) The Coronavirus on the Respiratory Panel, DOES NOT test for the novel  Coronavirus (2019 nCoV)    Coronavirus HKU1 NOT DETECTED NOT DETECTED Final   Coronavirus NL63 NOT DETECTED NOT DETECTED Final   Coronavirus OC43 NOT DETECTED NOT DETECTED Final   Metapneumovirus NOT DETECTED NOT DETECTED Final   Rhinovirus / Enterovirus NOT DETECTED NOT DETECTED Final   Influenza A NOT DETECTED NOT DETECTED Final   Influenza B NOT DETECTED NOT DETECTED Final   Parainfluenza Virus 1 NOT DETECTED NOT DETECTED Final   Parainfluenza Virus 2 NOT DETECTED NOT DETECTED Final   Parainfluenza Virus 3 NOT DETECTED NOT DETECTED Final   Parainfluenza Virus 4 NOT DETECTED NOT DETECTED Final   Respiratory Syncytial Virus NOT DETECTED NOT DETECTED Final   Bordetella pertussis NOT DETECTED NOT DETECTED Final   Bordetella Parapertussis NOT DETECTED NOT DETECTED Final   Chlamydophila pneumoniae NOT DETECTED NOT DETECTED Final   Mycoplasma pneumoniae NOT DETECTED NOT DETECTED Final    Comment: Performed at [facility], [address], [place], [place] [address]    Time spent: 25 minutes  Signed: Vernal Alstrom, MD 12/18/2024   "

## 2024-12-27 DEATH — deceased

## 2025-10-04 ENCOUNTER — Inpatient Hospital Stay: Admitting: Oncology
